# Patient Record
Sex: Female | Born: 1955 | Race: White | Hispanic: No | Smoking: Current every day smoker
Health system: Southern US, Community
[De-identification: ages and names within clinical notes are randomized; demographics above are authoritative.]

## PROBLEM LIST (undated history)

## (undated) DIAGNOSIS — J449 Chronic obstructive pulmonary disease, unspecified: Secondary | ICD-10-CM

## (undated) DIAGNOSIS — G8929 Other chronic pain: Secondary | ICD-10-CM

## (undated) DIAGNOSIS — M1990 Unspecified osteoarthritis, unspecified site: Secondary | ICD-10-CM

## (undated) DIAGNOSIS — K589 Irritable bowel syndrome without diarrhea: Secondary | ICD-10-CM

## (undated) DIAGNOSIS — C189 Malignant neoplasm of colon, unspecified: Secondary | ICD-10-CM

## (undated) DIAGNOSIS — M542 Cervicalgia: Secondary | ICD-10-CM

## (undated) DIAGNOSIS — R109 Unspecified abdominal pain: Secondary | ICD-10-CM

## (undated) DIAGNOSIS — G43909 Migraine, unspecified, not intractable, without status migrainosus: Secondary | ICD-10-CM

## (undated) DIAGNOSIS — C801 Malignant (primary) neoplasm, unspecified: Secondary | ICD-10-CM

## (undated) DIAGNOSIS — M549 Dorsalgia, unspecified: Secondary | ICD-10-CM

## (undated) DIAGNOSIS — E079 Disorder of thyroid, unspecified: Secondary | ICD-10-CM

## (undated) DIAGNOSIS — C73 Malignant neoplasm of thyroid gland: Secondary | ICD-10-CM

## (undated) HISTORY — PX: APPENDECTOMY: SHX54

## (undated) HISTORY — PX: ABDOMINAL HYSTERECTOMY: SHX81

## (undated) HISTORY — DX: Migraine, unspecified, not intractable, without status migrainosus: G43.909

## (undated) HISTORY — PX: BREAST LUMPECTOMY: SHX2

## (undated) HISTORY — DX: Malignant neoplasm of thyroid gland: C73

## (undated) HISTORY — PX: COLON RESECTION: SHX5231

---

## 2002-06-25 ENCOUNTER — Ambulatory Visit (HOSPITAL_COMMUNITY): Admission: RE | Admit: 2002-06-25 | Discharge: 2002-06-25 | Payer: Self-pay | Admitting: General Surgery

## 2007-05-30 ENCOUNTER — Ambulatory Visit (HOSPITAL_COMMUNITY): Admission: RE | Admit: 2007-05-30 | Discharge: 2007-05-30 | Payer: Self-pay | Admitting: Family Medicine

## 2008-06-20 ENCOUNTER — Ambulatory Visit (HOSPITAL_COMMUNITY): Admission: RE | Admit: 2008-06-20 | Discharge: 2008-06-20 | Payer: Self-pay | Admitting: Family Medicine

## 2009-01-31 ENCOUNTER — Emergency Department (HOSPITAL_COMMUNITY): Admission: EM | Admit: 2009-01-31 | Discharge: 2009-01-31 | Payer: Self-pay | Admitting: Emergency Medicine

## 2009-03-26 ENCOUNTER — Encounter (INDEPENDENT_AMBULATORY_CARE_PROVIDER_SITE_OTHER): Payer: Self-pay | Admitting: *Deleted

## 2009-04-23 ENCOUNTER — Encounter (INDEPENDENT_AMBULATORY_CARE_PROVIDER_SITE_OTHER): Payer: Self-pay | Admitting: *Deleted

## 2009-06-19 ENCOUNTER — Encounter (INDEPENDENT_AMBULATORY_CARE_PROVIDER_SITE_OTHER): Payer: Self-pay | Admitting: *Deleted

## 2009-06-22 ENCOUNTER — Ambulatory Visit (HOSPITAL_COMMUNITY): Admission: RE | Admit: 2009-06-22 | Discharge: 2009-06-22 | Payer: Self-pay | Admitting: Family Medicine

## 2009-07-08 ENCOUNTER — Ambulatory Visit: Payer: Self-pay | Admitting: Gastroenterology

## 2009-07-08 DIAGNOSIS — K625 Hemorrhage of anus and rectum: Secondary | ICD-10-CM

## 2009-07-08 DIAGNOSIS — R109 Unspecified abdominal pain: Secondary | ICD-10-CM | POA: Insufficient documentation

## 2009-07-12 DIAGNOSIS — Z85038 Personal history of other malignant neoplasm of large intestine: Secondary | ICD-10-CM | POA: Insufficient documentation

## 2009-07-17 ENCOUNTER — Ambulatory Visit: Payer: Self-pay | Admitting: Gastroenterology

## 2009-07-17 ENCOUNTER — Ambulatory Visit (HOSPITAL_COMMUNITY): Admission: RE | Admit: 2009-07-17 | Discharge: 2009-07-17 | Payer: Self-pay | Admitting: Gastroenterology

## 2009-07-17 HISTORY — PX: COLONOSCOPY: SHX174

## 2010-04-04 ENCOUNTER — Encounter: Payer: Self-pay | Admitting: Family Medicine

## 2010-04-15 NOTE — Assessment & Plan Note (Signed)
Summary: ABD PAIN, PERSONAL HX OF COLON CA   Visit Type:  Initial Consult Referring Provider:  Colvin Caroli FNP Primary Care Provider:  Health Dept  Chief Complaint:  abd pain.  History of Present Illness: Pain in lower abd since colon CA 2007. Took out right side. Had chemo?. Surgery at [facility]. Had one TCS ? 2008: one polyp. No problems with prep or sedation. BMs: rare, 1 q2-3 weeks. Not much water. Trying to increase fiber. No vomiting. Feels nauseous all the time especially after eating. No HONc visit since 2-3 years. Mouth stays dry and breaks out. Food may get stuck in throat. Takes ASA. Not much problems with solid food. Having pain in chest below left breast-Dx:COPD or asthma. Quit cigs  ~2 years. No Etoh. Uses Ibuprofen 1x qwk if hurting in hand, toes, knees, backs. Constipation since 2007. Can have pain in upr abdomen. Eats fiber when she can get it. MOM makes her go and now taking a whole lot. saw blood in stool one time. No weight loss. Actually gained weight.   Belly pain: all the time. Keeps awake-just about every night. Sharp:LLQ and also across bottom. No fever or chills. Having hot flashes. Having congestion. No dysuria or hematuria since DEC 2010.   Preventive Screening-Counseling & Management      Drug Use:  no.    Current Medications (verified): 1)  Advair .... As Directed 2)  Pro Air .... As Directed 3)  Ibuprofen .... As Needed 4)  Milk of Magnesia .... As Needed  Allergies (verified): No Known Drug Allergies  Past History:  Past Medical History: Colon CA Arthritis COPD?  Past Surgical History: Bowel surgery Tumor right arm 2004  Family History: No FH of Colon Cancer or polyps  Social History: Occupation: unemployed used to [employer], 2 kids-youngest-29 Illicit Drug Use - no Drug Use:  no  Review of Systems       PER HPI OTHERWISE ALL SYSTEMS NEGATIVE.  JAN 2011: 174 LBS, HEMATURIA  Vital Signs:  Patient profile:   55 year old  female Height:      62 inches Weight:      178 pounds BMI:     32.67 Temp:     98.1 degrees F oral Pulse rate:   72 / minute BP sitting:   110 / 74  (left arm) Cuff size:   regular  Vitals Entered By: Cloria Spring LPN (July 08, 2009 2:29 PM)  Physical Exam  General:  Well developed, well nourished, no acute distress. Head:  Normocephalic and atraumatic. Eyes:  PERRLA, no icterus. Mouth:  No deformity or lesions. Neck:  Supple; no masses. Lungs:  Clear throughout to auscultation. Heart:  Regular rate and rhythm; no murmurs. Abdomen:  Soft, mild TTP in BLQ, nondistended. No masses, hepatosplenomegaly or hernias noted. Normal bowel sounds without guarding and without rebound.   Extremities:  No edema noted. Neurologic:  Alert and  oriented x4;  grossly normal neurologically.  Impression & Recommendations:  Problem # 1:  ABDOMINAL PAIN, LOWER (ICD-789.09) Most likely 2o to IBS. Differential diagnosis includes microscopic colitis, lactose intolerance, and less likely IBD or diverticulitis. Take Digestive Advantage Constipation formula daily. Drink 6 to 8 cups water daily. Follow a high fiber diet. Take Miralax daily.   Orders: Consultation Level IV (46962)  Problem # 2:  RECTAL BLEEDING (ICD-569.3) Differential diagnosis includes colorectal polyp/cancer, anastomotic ulcer, and less likely IBS, or infectious colitis. Colonoscopy w/i next 1-2 weeks. Return visit in 4 mos.  Orders: Consultation Level IV (98119)  CC: PCP  Patient Instructions: 1)  Take Digestive Advantage Constipation formula daily. 2)  Drink 6 to 8 cups water daily. 3)  Colonoscopy w/i next 1-2 weeks. 4)  Follow a high fiber diet. 5)  Take Miralax daily.  6)  Return visit in 4 mos. 7)  The medication list was reviewed and reconciled.  All changed / newly prescribed medications were explained.  A complete medication list was provided to the patient / caregiver.  Appended Document: ABD PAIN, PERSONAL HX OF  COLON CA reminder in computer

## 2010-04-15 NOTE — Letter (Signed)
Summary: Generic Letter, Intro to Referring  [facility]  [address]   [place], [place] [address]   Phone: [phone]  Fax: [phone]      April 23, 2009             RE: Paula Carrillo   1955/11/30                 [address]                 [place], [place]  [address]  Dear,            Sincerely,    Ricard Dillon  [facility] Ph: [phone]   Fax: [phone]

## 2010-04-15 NOTE — Letter (Signed)
Summary: Appointment Reminder  [facility]  [address]   [place], [place] [address]   Phone: [phone]  Fax: [phone]       March 26, 2009   Paula Carrillo [address] [place], [place]  [address] 02/17/1956    Dear [name],  We have been unable to reach you by phone to schedule a follow up   appointment that was recommended for you by Dr. Darrick Penna. It is very   important that we reach you to schedule an appointment. We hope that you  allow [place] to participate in your health care needs. Please contact us at  [phone] at your earliest convenience to schedule your appointment.  Sincerely,    [facility] R. Roetta Sessions, M.D.    Kassie Mends, M.D. Lorenza Burton, FNP-BC    Tana Coast, PA-C Phone: [phone]    Fax: [phone]

## 2010-04-15 NOTE — Letter (Signed)
Summary: TCS ORDER  TCS ORDER   Imported By: Ave Filter 07/08/2009 15:33:01  _____________________________________________________________________  External Attachment:    Type:   Image     Comment:   External Document

## 2010-04-15 NOTE — Letter (Signed)
Summary: Generic Letter, Intro to Referring  [facility]  [address]   [place], [place] [address]   Phone: [phone]  Fax: [phone]      April 23, 2009             RE: Paula Carrillo   01-Sep-1955                 [address]                 [place], [place]  [address]  Dear, Appt Scheduler  You referred this pt to our office. We have tried to reach [name] by phone and mail, she has failed to contact us to schedule an appointment.    Sincerely,   [facility] Ph: [phone]   Fax: [phone]

## 2010-04-15 NOTE — Letter (Signed)
Summary: Scheduled Appointment  [facility]  [address]   [place], [place] [address]   Phone: [phone]  Fax: [phone]    June 19, 2009   Dear: Sheppard Penton            DOB: 05-27-1955    I have been instructed to schedule you an appointment in our office.  Your appointment is as follows:   Date: ___4/27/11________________   Time: ___4:00PM______________     Please be here 15 minutes early.   Provider: _______Fields____________________________________    Please contact the office if you need to reschedule this appointment for a more convenient time.   Thank you,    [facility] Ph: [phone]   Fax: [phone]

## 2010-06-16 LAB — URINALYSIS, ROUTINE W REFLEX MICROSCOPIC
Bilirubin Urine: NEGATIVE
Nitrite: NEGATIVE
Urobilinogen, UA: 0.2 mg/dL (ref 0.0–1.0)

## 2010-06-16 LAB — DIFFERENTIAL
Basophils Absolute: 0.1 10*3/uL (ref 0.0–0.1)
Lymphocytes Relative: 38 % (ref 12–46)
Lymphs Abs: 2.5 10*3/uL (ref 0.7–4.0)
Monocytes Absolute: 0.5 10*3/uL (ref 0.1–1.0)
Monocytes Relative: 8 % (ref 3–12)

## 2010-06-16 LAB — BASIC METABOLIC PANEL
BUN: 6 mg/dL (ref 6–23)
Creatinine, Ser: 0.62 mg/dL (ref 0.4–1.2)
Potassium: 3.4 mEq/L — ABNORMAL LOW (ref 3.5–5.1)

## 2010-06-16 LAB — CBC
Hemoglobin: 12.9 g/dL (ref 12.0–15.0)
RBC: 4.23 MIL/uL (ref 3.87–5.11)
RDW: 13.2 % (ref 11.5–15.5)
WBC: 6.6 10*3/uL (ref 4.0–10.5)

## 2010-06-16 LAB — URINE MICROSCOPIC-ADD ON

## 2010-06-16 LAB — PROTIME-INR
INR: 0.88 (ref 0.00–1.49)
Prothrombin Time: 12.1 seconds (ref 11.6–15.2)

## 2010-06-16 LAB — APTT: aPTT: 35 seconds (ref 24–37)

## 2010-07-30 NOTE — H&P (Signed)
   NAME:  Paula Carrillo, Weissmann NO.:  [id number]   MEDICAL RECORD NO.:  [id number]                  PATIENT TYPE:   LOCATION:                                       FACILITY:   PHYSICIAN:  Dalia Heading, M.D.               DATE OF BIRTH:  1955/09/01   DATE OF ADMISSION:  06/25/2002  DATE OF DISCHARGE:                                HISTORY & PHYSICAL   CHIEF COMPLAINT:  Enlarging mass, right arm.   HISTORY AND PHYSICAL:  The patient is a 55 year old, white female who is  referred for evaluation and treatment of an enlarging mass on her right arm.  It has been present for several years, but recently has increased in size  and is causing some discomfort.   PAST MEDICAL HISTORY:  Unremarkable.   PAST SURGICAL HISTORY:  Unremarkable.   CURRENT MEDICATIONS:  None.   ALLERGIES:  No known drug allergies.   REVIEW OF SYMPTOMS:  The patient does smoke one pack of cigarettes per day.   PHYSICAL EXAMINATION:  GENERAL:  The patient is a well-developed, well-  nourished, white female in no acute distress.  VITAL SIGNS:  She is afebrile and vital signs are stable.  LUNGS:  Clear to auscultation with equal breath sounds bilaterally.  HEART:  Regular rate and rhythm without S3, S4 or murmurs.  EXTREMITIES:  Right arm examination reveals a large, ovoid, greater than 6  cm, soft, subcutaneous mass in the right upper arm posteriorly.  It is  mobile.  The right arm is neurovascularly intact.   IMPRESSION:  Mass, right arm.   PLAN:  The patient is scheduled for excision of the mass, right arm, on  June 25, 2002.  The risks and benefits of the procedure including bleeding  and infection were fully explained to the patient gaining informed consent.                                               Dalia Heading, M.D.    MAJ/MEDQ  D:  06/13/2002  T:  06/13/2002  Job:  [id number]   cc:   Health Department

## 2010-07-30 NOTE — Op Note (Signed)
   NAME:  Paula Carrillo, Paula Carrillo                       ACCOUNT NO.:  [id number]   MEDICAL RECORD NO.:  [id number]                   PATIENT TYPE:  AMB   LOCATION:  DAY                                  FACILITY:  [facility]   PHYSICIAN:  Dalia Heading, M.D.               DATE OF BIRTH:  10-23-1955   DATE OF PROCEDURE:  06/25/2002  DATE OF DISCHARGE:                                 OPERATIVE REPORT   PREOPERATIVE DIAGNOSIS:  Enlarging mass, right arm.   POSTOPERATIVE DIAGNOSIS:  Enlarging mass, right arm.   PROCEDURE:  Excision of benign mass, right arm, 9 cm.   SURGEON:  Dalia Heading, M.D.   ANESTHESIA:  General.   INDICATIONS:  The patient is a 55 year old white female who presents with an  enlarging mass in the posterior aspect of the right upper arm.  The patient  now comes to the operating room for excision of the mass.  The risks and  benefits of the procedure, including bleeding, infection, and recurrence of  the mass, were fully explained to the patient, who gave informed consent.   DESCRIPTION OF PROCEDURE:  The patient was placed in the supine position.  After general anesthesia was administered, the right arm was prepped and  draped using the usual sterile technique with Betadine.  Surgical site  confirmation was performed.   A longitudinal incision was made over the mass.  This was taken down in the  subcutaneous tissue, and the mass was excised without difficulty.  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  It was sent to pathology for further examination.  Any bleeding was controlled using Bovie electrocautery.  The subcutaneous  layer was reapproximated using a 3-0 Vicryl interrupted suture.  The skin  was closed using a 4-0 nylon vertical mattress suture.  Betadine ointment  and dry sterile dressing were applied.  Sensorcaine 0.5% was instilled into  the surrounding wound.   All tape and needle counts were correct at the end of the procedure.  The  patient was awakened in the  operating room and went back to the recovery  room awake, in stable condition.   COMPLICATIONS:  None.    SPECIMENS:  Mass, right arm.   ESTIMATED BLOOD LOSS:  Minimal.                                               Dalia Heading, M.D.    MAJ/MEDQ  D:  06/25/2002  T:  06/25/2002  Job:  [id number]   cc:   Health Department

## 2011-01-04 ENCOUNTER — Other Ambulatory Visit (HOSPITAL_COMMUNITY): Payer: Self-pay | Admitting: Family Medicine

## 2011-01-04 DIAGNOSIS — Z139 Encounter for screening, unspecified: Secondary | ICD-10-CM

## 2011-01-13 ENCOUNTER — Ambulatory Visit (HOSPITAL_COMMUNITY): Payer: Self-pay

## 2011-02-18 ENCOUNTER — Ambulatory Visit (HOSPITAL_COMMUNITY)
Admission: RE | Admit: 2011-02-18 | Discharge: 2011-02-18 | Disposition: A | Discharge status: Home / Self Care | Payer: PRIVATE HEALTH INSURANCE | Source: Ambulatory Visit | Attending: Family Medicine | Admitting: Family Medicine

## 2011-02-18 DIAGNOSIS — Z139 Encounter for screening, unspecified: Secondary | ICD-10-CM

## 2011-02-18 DIAGNOSIS — Z1231 Encounter for screening mammogram for malignant neoplasm of breast: Secondary | ICD-10-CM | POA: Insufficient documentation

## 2011-05-24 ENCOUNTER — Emergency Department (HOSPITAL_COMMUNITY): Payer: Self-pay

## 2011-05-24 ENCOUNTER — Emergency Department (HOSPITAL_COMMUNITY)
Admission: EM | Admit: 2011-05-24 | Discharge: 2011-05-24 | Disposition: A | Discharge status: Home Health | Payer: Self-pay | Attending: Emergency Medicine | Admitting: Emergency Medicine

## 2011-05-24 ENCOUNTER — Other Ambulatory Visit: Payer: Self-pay

## 2011-05-24 ENCOUNTER — Encounter (HOSPITAL_COMMUNITY): Payer: Self-pay | Admitting: Emergency Medicine

## 2011-05-24 DIAGNOSIS — J45909 Unspecified asthma, uncomplicated: Secondary | ICD-10-CM | POA: Diagnosis present

## 2011-05-24 DIAGNOSIS — K589 Irritable bowel syndrome without diarrhea: Secondary | ICD-10-CM | POA: Insufficient documentation

## 2011-05-24 DIAGNOSIS — R51 Headache: Secondary | ICD-10-CM | POA: Insufficient documentation

## 2011-05-24 DIAGNOSIS — M545 Low back pain, unspecified: Secondary | ICD-10-CM | POA: Insufficient documentation

## 2011-05-24 DIAGNOSIS — J449 Chronic obstructive pulmonary disease, unspecified: Secondary | ICD-10-CM | POA: Insufficient documentation

## 2011-05-24 DIAGNOSIS — R42 Dizziness and giddiness: Secondary | ICD-10-CM | POA: Insufficient documentation

## 2011-05-24 DIAGNOSIS — R001 Bradycardia, unspecified: Secondary | ICD-10-CM | POA: Diagnosis present

## 2011-05-24 DIAGNOSIS — R9431 Abnormal electrocardiogram [ECG] [EKG]: Secondary | ICD-10-CM | POA: Insufficient documentation

## 2011-05-24 DIAGNOSIS — R079 Chest pain, unspecified: Secondary | ICD-10-CM | POA: Insufficient documentation

## 2011-05-24 DIAGNOSIS — M542 Cervicalgia: Secondary | ICD-10-CM | POA: Insufficient documentation

## 2011-05-24 DIAGNOSIS — J4489 Other specified chronic obstructive pulmonary disease: Secondary | ICD-10-CM | POA: Insufficient documentation

## 2011-05-24 DIAGNOSIS — M7918 Myalgia, other site: Secondary | ICD-10-CM | POA: Diagnosis present

## 2011-05-24 DIAGNOSIS — R109 Unspecified abdominal pain: Secondary | ICD-10-CM | POA: Insufficient documentation

## 2011-05-24 DIAGNOSIS — G8929 Other chronic pain: Secondary | ICD-10-CM | POA: Insufficient documentation

## 2011-05-24 HISTORY — DX: Malignant (primary) neoplasm, unspecified: C80.1

## 2011-05-24 HISTORY — DX: Chronic obstructive pulmonary disease, unspecified: J44.9

## 2011-05-24 HISTORY — DX: Unspecified abdominal pain: R10.9

## 2011-05-24 HISTORY — DX: Cervicalgia: M54.2

## 2011-05-24 HISTORY — DX: Other chronic pain: G89.29

## 2011-05-24 HISTORY — DX: Malignant neoplasm of colon, unspecified: C18.9

## 2011-05-24 HISTORY — DX: Irritable bowel syndrome, unspecified: K58.9

## 2011-05-24 HISTORY — DX: Dorsalgia, unspecified: M54.9

## 2011-05-24 LAB — TROPONIN I
Troponin I: <0.3 ng/mL (ref <0.30)
Troponin I: <0.3 ng/mL (ref <0.30)

## 2011-05-24 LAB — CBC
HCT: 39.2 % (ref 36.0–46.0)
Hemoglobin: 13.2 g/dL (ref 12.0–15.0)
MCHC: 33.7 g/dL (ref 30.0–36.0)
MCV: 87.1 fL (ref 78.0–100.0)
Platelets: 304 10*3/uL (ref 150–400)
RDW: 12.9 % (ref 11.5–15.5)

## 2011-05-24 LAB — DIFFERENTIAL
Eosinophils Relative: 3 % (ref 0–5)
Lymphocytes Relative: 40 % (ref 12–46)
Lymphs Abs: 2.4 10*3/uL (ref 0.7–4.0)

## 2011-05-24 LAB — BASIC METABOLIC PANEL
BUN: 7 mg/dL (ref 6–23)
GFR calc non Af Amer: >90 mL/min (ref >90)
Potassium: 3.6 mEq/L (ref 3.5–5.1)
Sodium: 141 mEq/L (ref 135–145)

## 2011-05-24 MED ORDER — NITROGLYCERIN 0.4 MG SL SUBL
0.4000 mg | SUBLINGUAL_TABLET | SUBLINGUAL | Status: DC | PRN
  Administered 2011-05-24: 0.4 mg via SUBLINGUAL
  Filled 2011-05-24: qty 25

## 2011-05-24 MED ORDER — GI COCKTAIL ~~LOC~~
30.0000 mL | Freq: Once | ORAL | Status: AC
  Administered 2011-05-24: 30 mL via ORAL
  Filled 2011-05-24: qty 30

## 2011-05-24 MED ORDER — MORPHINE SULFATE 2 MG/ML IJ SOLN
2.0000 mg | INTRAMUSCULAR | Status: DC | PRN
  Administered 2011-05-24: 2 mg via INTRAVENOUS
  Filled 2011-05-24: qty 1

## 2011-05-24 MED ORDER — ASPIRIN 81 MG PO CHEW
324.0000 mg | CHEWABLE_TABLET | Freq: Once | ORAL | Status: AC
  Administered 2011-05-24: 324 mg via ORAL
  Filled 2011-05-24: qty 4

## 2011-05-24 NOTE — Discharge Instructions (Signed)
Stop taking the ibuprofen and take vicodin for pain. Take prilosec for acid reflux and return to the health department in 2-3 days.

## 2011-05-24 NOTE — Consult Note (Signed)
Paula Carrillo MRN: [id number] DOB/AGE: May 27, 1955 56 y.o. Primary Care Physician:No primary provider on file. Referring physician: Dr. Clarene Duke Consult date: 05/24/2011 Chief Complaint: Chest pain HPI: The patient is a 56 year old woman with a past medical history significant for colon cancer, asthma, degenerative joint disease, and chronic pain, who presented to the emergency department with a chief complaint of left-sided chest pain. She also complained of neck pain radiating to her left arm, occasional wheezing, occasional nausea, occasional pleurisy, occasional shortness of breath, abdominal pain, diarrhea, swelling in her ankles, occasional fever, and occasional chills. She had no complaints of diaphoresis, vomiting, abdominal pain, or heartburn.  In the emergency department, she is noted to be mildly bradycardic otherwise hemodynamically stable. All of her laboratory data are within normal limits. Her EKG reveals sinus bradycardia with nonspecific T-wave abnormalities, but no ST elevations or depressions. Her chest x-ray reveals no active cardiopulmonary disease.  Past Medical History  Diagnosis Date  . Chronic pain   . Asthma   . Cancer   . Colon cancer   . COPD (chronic obstructive pulmonary disease)   . IBS (irritable bowel syndrome)   . Chronic back pain   . Chronic neck pain   . Chronic abdominal pain   . Hemorrhoids     Past Surgical History  Procedure Date  . Appendectomy   . Colon resection   . Abdominal hysterectomy   . Breast lumpectomy     Prior to Admission medications   Medication Sig Start Date End Date Taking? Authorizing Provider  albuterol (PROVENTIL HFA;VENTOLIN HFA) 108 (90 BASE) MCG/ACT inhaler Inhale 2 puffs into the lungs every 6 (six) hours as needed. Shortness of breath   Yes Historical Provider, MD  benzonatate (TESSALON) 100 MG capsule Take 100 mg by mouth 3 (three) times daily as needed. cough   Yes Historical Provider, MD  cyclobenzaprine  (FLEXERIL) 10 MG tablet Take 10 mg by mouth 3 (three) times daily as needed. Muscle spasms   Yes Historical Provider, MD  Fluticasone-Salmeterol (ADVAIR) 500-50 MCG/DOSE AEPB Inhale 1 puff into the lungs every 12 (twelve) hours.   Yes Historical Provider, MD  ibuprofen (ADVIL,MOTRIN) 800 MG tablet Take 800 mg by mouth every 8 (eight) hours as needed. pain   Yes Historical Provider, MD  loratadine (CLARITIN) 10 MG tablet Take 10 mg by mouth daily. allergies   Yes Historical Provider, MD  predniSONE (DELTASONE) 5 MG tablet Take 5 mg by mouth daily.   Yes Historical Provider, MD    Allergies: No Known Allergies  No family history on file.  Her mother died of a stroke. Her father died of emphysema. Neither of her parents had heart disease or heart failure.  Social History: She is separated. She lives with her daughter in [place]. She denies alcohol and illicit drug use.  reports that she has quit smoking. She does not have any smokeless tobacco history on file. She reports that she does not drink alcohol or use illicit drugs.    ROS: Grossly positive.  PHYSICAL EXAM: Blood pressure 122/80, pulse 95, temperature 98 F (36.7 C), temperature source Oral, resp. rate 18, height 5\' 4"  (1.626 m), weight 77.111 kg (170 lb), SpO2 96.00%.  General: Alert 56 year old Caucasian woman sitting up in bed, in no acute distress. HEENT: Head is normocephalic, nontraumatic. Pupils are equal, round, and reactive to light. Extraocular lungs are intact. Conjunctivae are clear. Sclerae are white. Neck: Supple, no optic, no thyromegaly, no JVD. She does have mild to moderate tenderness  over her para cervical muscles and trapezius muscles. No overt spasms, erythema, or edema. Lungs: Clear to auscultation bilaterally. Heart: S1, S2, with borderline bradycardia. Chest wall: Tender over the pectoralis major and pectoralis minor on the left. Abdomen: Positive bowel sounds, soft, nontender, nondistended. Extremities: Pedal  pulses palpable bilaterally. No pretibial edema and no pedal edema. No acute hot red joints. She does have some tenderness over her knees without effusion warmth, or redness. Neurologic: She is alert and oriented x3.  Basic Metabolic Panel:  Basename 05/24/11 1403  NA 141  K 3.6  CL 107  CO2 25  GLUCOSE 96  BUN 7  CREATININE 0.59  CALCIUM 10.0  MG --  PHOS --   Liver Function Tests: No results found for this basename: AST:2,ALT:2,ALKPHOS:2,BILITOT:2,PROT:2,ALBUMIN:2 in the last 72 hours No results found for this basename: LIPASE:2,AMYLASE:2 in the last 72 hours No results found for this basename: AMMONIA:2 in the last 72 hours CBC:  Basename 05/24/11 1403  WBC 5.9  NEUTROABS 2.9  HGB 13.2  HCT 39.2  MCV 87.1  PLT 304   Cardiac Enzymes:  Basename 05/24/11 1632 05/24/11 1403  CKTOTAL -- --  CKMB -- --  CKMBINDEX -- --  TROPONINI <0.30 <0.30   BNP: No results found for this basename: PROBNP:3 in the last 72 hours D-Dimer:  Basename 05/24/11 1403  DDIMER 0.27   CBG: No results found for this basename: GLUCAP:6 in the last 72 hours Hemoglobin A1C: No results found for this basename: HGBA1C in the last 72 hours Fasting Lipid Panel: No results found for this basename: CHOL,HDL,LDLCALC,TRIG,CHOLHDL,LDLDIRECT in the last 72 hours Thyroid Function Tests: No results found for this basename: TSH,T4TOTAL,FREET4,T3FREE,THYROIDAB in the last 72 hours Anemia Panel: No results found for this basename: VITAMINB12,FOLATE,FERRITIN,TIBC,IRON,RETICCTPCT in the last 72 hours Coagulation: No results found for this basename: LABPROT:2,INR:2 in the last 72 hours Urine Drug Screen: Drugs of Abuse  No results found for this basename: labopia, cocainscrnur, labbenz, amphetmu, thcu, labbarb    Alcohol Level: No results found for this basename: ETH:2 in the last 72 hours Urinalysis: No results found for this basename:  COLORURINE:2,APPERANCEUR:2,LABSPEC:2,PHURINE:2,GLUCOSEU:2,HGBUR:2,BILIRUBINUR:2,KETONESUR:2,PROTEINUR:2,UROBILINOGEN:2,NITRITE:2,LEUKOCYTESUR:2 in the last 72 hours Misc. Labs:   EKG: Sinus bradycardia with a heart rate of 54 beats per minute. EKG: Sinus bradycardia with a heart rate of 57 beats per minute and nonspecific T-wave abnormalities.  No results found for this or any previous visit (from the past 240 hour(s)).   Results for orders placed during the hospital encounter of 05/24/11 (from the past 48 hour(s))  BASIC METABOLIC PANEL     Status: Normal   Collection Time   05/24/11  2:03 PM      Component Value Range Comment   Sodium 141  135 - 145 (mEq/L)    Potassium 3.6  3.5 - 5.1 (mEq/L)    Chloride 107  96 - 112 (mEq/L)    CO2 25  19 - 32 (mEq/L)    Glucose, Bld 96  70 - 99 (mg/dL)    BUN 7  6 - 23 (mg/dL)    Creatinine, Ser 4.09  0.50 - 1.10 (mg/dL)    Calcium 81.1  8.4 - 10.5 (mg/dL)    GFR calc non Af Amer >90  >90 (mL/min)    GFR calc Af Amer >90  >90 (mL/min)   TROPONIN I     Status: Normal   Collection Time   05/24/11  2:03 PM      Component Value Range Comment   Troponin I <  0.30  <0.30 (ng/mL)   CBC     Status: Normal   Collection Time   05/24/11  2:03 PM      Component Value Range Comment   WBC 5.9  4.0 - 10.5 (K/uL)    RBC 4.50  3.87 - 5.11 (MIL/uL)    Hemoglobin 13.2  12.0 - 15.0 (g/dL)    HCT 16.1  09.6 - 04.5 (%)    MCV 87.1  78.0 - 100.0 (fL)    MCH 29.3  26.0 - 34.0 (pg)    MCHC 33.7  30.0 - 36.0 (g/dL)    RDW 40.9  81.1 - 91.4 (%)    Platelets 304  150 - 400 (K/uL)   DIFFERENTIAL     Status: Normal   Collection Time   05/24/11  2:03 PM      Component Value Range Comment   Neutrophils Relative 49  43 - 77 (%)    Neutro Abs 2.9  1.7 - 7.7 (K/uL)    Lymphocytes Relative 40  12 - 46 (%)    Lymphs Abs 2.4  0.7 - 4.0 (K/uL)    Monocytes Relative 6  3 - 12 (%)    Monocytes Absolute 0.4  0.1 - 1.0 (K/uL)    Eosinophils Relative 3  0 - 5 (%)     Eosinophils Absolute 0.2  0.0 - 0.7 (K/uL)    Basophils Relative 1  0 - 1 (%)    Basophils Absolute 0.1  0.0 - 0.1 (K/uL)   D-DIMER, QUANTITATIVE     Status: Normal   Collection Time   05/24/11  2:03 PM      Component Value Range Comment   D-Dimer, Quant 0.27  0.00 - 0.48 (ug/mL-FEU)   TROPONIN I     Status: Normal   Collection Time   05/24/11  4:32 PM      Component Value Range Comment   Troponin I <0.30  <0.30 (ng/mL)     Dg Chest 2 View  05/24/2011  *RADIOLOGY REPORT*  Clinical Data: Rule out infiltrate, congestive heart failure  CHEST - 2 VIEW  Comparison: None.  Findings: Cardiomediastinal silhouette is unremarkable.  No acute infiltrate or pleural effusion.  No pulmonary edema.  Bony thorax is unremarkable.  IMPRESSION: No active disease.  Original Report Authenticated By: Natasha Mead, M.D.    Impression:  Active Problems:  Chest pain  Musculoskeletal pain  Bradycardia  Asthma  This is a 56 year old woman with a history of asthma and degenerative joint disease. She presents with chest pain which is more consistent with musculoskeletal pain. She may have radiating pain from degenerative joint pain in her neck and her shoulder girdle. She has degenerative joint disease for which she takes high-dose ibuprofen. Therefore, reflux and/or esophagitis is a consideration. She does not need to be admitted to the hospital given her virtually unremarkable EKG, normal cardiac enzymes, no significant risk factors, and hemodynamic stability.   Plan:  The patient was advised to discontinue ibuprofen. She was given a prescription for Vicodin 5/325 mg one tablet every 4 hours as needed for pain. She was advised to take Prilosec OTC. A prescription was given. She was advised to followup with her primary care provider at the health department for further evaluation. She was discharged to home per my recommendation.      Tempie Gibeault 05/24/2011, 7:57 PM

## 2011-05-24 NOTE — ED Notes (Signed)
Pt c/o abd, back, chest, neck, and head pain that has been going on a while. Pt was sent over by the [facility].

## 2011-05-24 NOTE — ED Provider Notes (Signed)
History     CSN: [id number]  Arrival date & time 05/24/11  1247   First MD Initiated Contact with Patient 05/24/11 1326      Chief Complaint  Patient presents with  . Abdominal Pain  . Back Pain  . Headache  . Dizziness  . Chest Pain    The history is provided by the patient. History Limited By: poor historian.   Pt was seen at 1335.  Per pt, c/o gradual onset and persistence of intermittent left sided chest "pain" x3 weeks, worse over the past 3 days.  Pt describes the pain as on her left chest area.  Pt also c/o gradual onset and persistence of constant acute flair of her chronic neck and low back "pains" for the past several months.  Denies any change in her usual chronic pain pattern.  Denies incont/retention of bowel or bladder, no saddle anesthesia, no focal motor weakness, no tingling/numbness in extremities, no fevers, no injury.   [facility] and sent to the ED for further eval.    Past Medical History  Diagnosis Date  . Chronic pain   . Asthma   . Cancer   . Colon cancer   . COPD (chronic obstructive pulmonary disease)   . IBS (irritable bowel syndrome)   . Chronic back pain   . Chronic neck pain   . Chronic abdominal pain   . Hemorrhoids     Past Surgical History  Procedure Date  . Appendectomy   . Colon resection   . Abdominal hysterectomy   . Breast lumpectomy     History  Substance Use Topics  . Smoking status: Former Games developer  . Smokeless tobacco: Not on file  . Alcohol Use: No    Review of Systems  Unable to perform ROS: Other    Allergies  Review of patient's allergies indicates no known allergies.  Home Medications   Current Outpatient Rx  Name Route Sig Dispense Refill  . ALBUTEROL SULFATE HFA 108 (90 BASE) MCG/ACT IN AERS Inhalation Inhale 2 puffs into the lungs every 6 (six) hours as needed. Shortness of breath    . BENZONATATE 100 MG PO CAPS Oral Take 100 mg by mouth 3 (three) times daily as needed. cough    .  CYCLOBENZAPRINE HCL 10 MG PO TABS Oral Take 10 mg by mouth 3 (three) times daily as needed. Muscle spasms    . FLUTICASONE-SALMETEROL 500-50 MCG/DOSE IN AEPB Inhalation Inhale 1 puff into the lungs every 12 (twelve) hours.    . IBUPROFEN 800 MG PO TABS Oral Take 800 mg by mouth every 8 (eight) hours as needed. pain    . LORATADINE 10 MG PO TABS Oral Take 10 mg by mouth daily. allergies    . PREDNISONE 5 MG PO TABS Oral Take 5 mg by mouth daily.      BP 122/80  Pulse 95  Temp(Src) 98 F (36.7 C) (Oral)  Resp 18  Ht 5\' 4"  (1.626 m)  Wt 170 lb (77.111 kg)  BMI 29.18 kg/m2  SpO2 96%  Physical Exam 1340: Physical examination:  Nursing notes reviewed; Vital signs and O2 SAT reviewed;  Constitutional: Well developed, Well nourished, Well hydrated, In no acute distress; Head:  Normocephalic, atraumatic; Eyes: EOMI, PERRL, No scleral icterus; ENMT: Mouth and pharynx normal, Mucous membranes moist; Neck: Supple, Full range of motion, No lymphadenopathy; Cardiovascular: Regular rate and rhythm, No murmur, rub, or gallop; Respiratory: Breath sounds clear & equal bilaterally,  No rales, rhonchi, wheezes, or rub, Normal respiratory effort/excursion; Chest: Nontender, Movement normal; Abdomen: Soft, +mid-epigastric and LUQ tenderness to palp, Nondistended, Normal bowel sounds; Extremities: Pulses normal, No tenderness, No edema, No calf edema or asymmetry.; Neuro: AA&Ox3, Major CN grossly intact.  No gross focal motor or sensory deficits in extremities.; Skin: Color normal, Warm, Dry, no rash.    ED Course  Procedures   1345:  06/2009 ofc note from GI Dr. Darrick Penna notes that pt has hx of chronic abd pain, as well as left CP "below her breast."  Pt asked regarding this.  Pt does agree that the discomfort under her left breast is chronic and unchanged over the past several years.  Will proceed with workup today.   1600:  Very long d/w pt regarding her symptoms as starting to get results of workup: Pt now  states that her usual chronic left sided chest pain underneath her breast "lasts for a few seconds" before resolving is unchanged for the past several years.  [facility] because for the past few days to weeks, she has had a NEW area of left sided CP, located at her left parasternal and anterior chest wall, that has been intermittently coming and going, lasts approx each episode, and is assoc with feeling "generally weak," "lightheaded," "nauseated," and "SOB."  States she has to "lay down" because her symptoms "make me feel so bad."  EKG repeated and now has new flipped T-waves anterior leads V3 and V4 compared to previous completed today, will check another troponin.  T/C to [facility] Dr. Sherrie Mustache, case discussed, including:  HPI, pertinent PM/SHx, VS/PE, dx testing, ED course and treatment:  Agreeable to come to ED for eval to admit for observation.    MDM  MDM Reviewed: nursing note, vitals and previous chart Reviewed previous: ECG Interpretation: ECG, labs and x-ray    Date: 05/24/2011  Rate: 54  Rhythm: sinus bradycardia  QRS Axis: normal  Intervals: normal  ST/T Wave abnormalities: normal  Conduction Disutrbances:none  Narrative Interpretation:   Old EKG Reviewed: none available.   Date: 05/24/2011  Rate: 57  Rhythm: sinus bradycardia  QRS Axis: normal  Intervals: normal  ST/T Wave abnormalities: nonspecific T wave changes  Conduction Disutrbances:none  Narrative Interpretation:   Old EKG Reviewed: changes noted new flipped T-waves V3 and V4 compared to previous EKG today.   Results for orders placed during the hospital encounter of 05/24/11  BASIC METABOLIC PANEL      Component Value Range   Sodium 141  135 - 145 (mEq/L)   Potassium 3.6  3.5 - 5.1 (mEq/L)   Chloride 107  96 - 112 (mEq/L)   CO2 25  19 - 32 (mEq/L)   Glucose, Bld 96  70 - 99 (mg/dL)   BUN 7  6 - 23 (mg/dL)   Creatinine, Ser 4.09  0.50 - 1.10 (mg/dL)   Calcium 81.1  8.4 -  10.5 (mg/dL)   GFR calc non Af Amer >90  >90 (mL/min)   GFR calc Af Amer >90  >90 (mL/min)  TROPONIN I      Component Value Range   Troponin I <0.30  <0.30 (ng/mL)  CBC      Component Value Range   WBC 5.9  4.0 - 10.5 (K/uL)   RBC 4.50  3.87 - 5.11 (MIL/uL)   Hemoglobin 13.2  12.0 - 15.0 (g/dL)   HCT 91.4  78.2 - 95.6 (%)   MCV 87.1  78.0 - 100.0 (fL)   MCH 29.3  26.0 - 34.0 (pg)   MCHC 33.7  30.0 - 36.0 (g/dL)   RDW 16.1  09.6 - 04.5 (%)   Platelets 304  150 - 400 (K/uL)  DIFFERENTIAL      Component Value Range   Neutrophils Relative 49  43 - 77 (%)   Neutro Abs 2.9  1.7 - 7.7 (K/uL)   Lymphocytes Relative 40  12 - 46 (%)   Lymphs Abs 2.4  0.7 - 4.0 (K/uL)   Monocytes Relative 6  3 - 12 (%)   Monocytes Absolute 0.4  0.1 - 1.0 (K/uL)   Eosinophils Relative 3  0 - 5 (%)   Eosinophils Absolute 0.2  0.0 - 0.7 (K/uL)   Basophils Relative 1  0 - 1 (%)   Basophils Absolute 0.1  0.0 - 0.1 (K/uL)  D-DIMER, QUANTITATIVE      Component Value Range   D-Dimer, Quant 0.27  0.00 - 0.48 (ug/mL-FEU)    Dg Chest 2 View 05/24/2011  *RADIOLOGY REPORT*  Clinical Data: Rule out infiltrate, congestive heart failure  CHEST - 2 VIEW  Comparison: None.  Findings: Cardiomediastinal silhouette is unremarkable.  No acute infiltrate or pleural effusion.  No pulmonary edema.  Bony thorax is unremarkable.  IMPRESSION: No active disease.  Original Report Authenticated By: Natasha Mead, M.D.               Laray Anger, DO 05/26/11 1433

## 2012-03-27 ENCOUNTER — Ambulatory Visit (INDEPENDENT_AMBULATORY_CARE_PROVIDER_SITE_OTHER): Payer: Medicaid Other | Admitting: Urology

## 2012-03-27 DIAGNOSIS — N393 Stress incontinence (female) (male): Secondary | ICD-10-CM

## 2012-03-27 DIAGNOSIS — N3000 Acute cystitis without hematuria: Secondary | ICD-10-CM

## 2012-03-27 DIAGNOSIS — Q619 Cystic kidney disease, unspecified: Secondary | ICD-10-CM

## 2012-07-09 ENCOUNTER — Encounter: Payer: Self-pay | Admitting: Gastroenterology

## 2012-07-11 ENCOUNTER — Encounter: Payer: Self-pay | Admitting: Gastroenterology

## 2012-07-11 ENCOUNTER — Encounter (HOSPITAL_COMMUNITY): Payer: Self-pay | Admitting: Pharmacy Technician

## 2012-07-11 ENCOUNTER — Other Ambulatory Visit: Payer: Self-pay

## 2012-07-11 ENCOUNTER — Ambulatory Visit (INDEPENDENT_AMBULATORY_CARE_PROVIDER_SITE_OTHER): Payer: Medicaid Other | Admitting: Gastroenterology

## 2012-07-11 VITALS — BP 135/72 | HR 78 | Temp 98.4°F | Ht 64.0 in | Wt 179.8 lb

## 2012-07-11 DIAGNOSIS — Z86010 Personal history of colonic polyps: Secondary | ICD-10-CM

## 2012-07-11 DIAGNOSIS — K625 Hemorrhage of anus and rectum: Secondary | ICD-10-CM

## 2012-07-11 DIAGNOSIS — Z85038 Personal history of other malignant neoplasm of large intestine: Secondary | ICD-10-CM

## 2012-07-11 MED ORDER — PEG 3350-KCL-NA BICARB-NACL 420 G PO SOLR: 4000.0000 mL | ORAL | Status: DC

## 2012-07-11 MED ORDER — HYDROCORTISONE ACETATE 25 MG RE SUPP: 25.0000 mg | Freq: Two times a day (BID) | RECTAL | Status: DC

## 2012-07-11 NOTE — Progress Notes (Signed)
.   Primary Care Physician:  Ernestine Conrad, MD Primary Gastroenterologist:  Dr. Darrick Penna   Chief Complaint  Patient presents with  . Rectal Bleeding  . Abdominal Pain    HPI:   [name] is a 57 year old female presenting today with a personal history of colon cancer, s/p resection, with last surveillance colonoscopy by Dr. Darrick Penna in 2011. Findings of normal anastomosis, internal hemorrhoids, tubular adenoma, and rare sigmoid diverticula.   Will take Metamucil, sometimes MOM. Dealing with constipation lately. Sometimes hard. +7 lbs weight loss in past few weeks, has cut down on po intake intentionally. "cut back". Sometimes a little nausea, sharp LLQ pain occasionally. Chronic since colectomy.   Notes rectal bleeding, rectal burning/sore onset about a year ago. Notes several episodes in past month. Painful when wiping, notes smears in her underwear, which is chronic. Paper hematochezia.   Past Medical History  Diagnosis Date  . Chronic pain   . Asthma   . Cancer   . Colon cancer   . COPD (chronic obstructive pulmonary disease)   . IBS (irritable bowel syndrome)   . Chronic back pain   . Chronic neck pain   . Chronic abdominal pain   . Hemorrhoids     Past Surgical History  Procedure Laterality Date  . Appendectomy    . Colon resection    . Abdominal hysterectomy    . Breast lumpectomy    . Colonoscopy  07/17/09    WUJ:WJXBJ internal hemorrhoids/tortuous colon/3-mm sessile hepatic polyp/sigmoid colon diverticula, small internal hemorrhoids, path: tubular adenoma    Current Outpatient Prescriptions  Medication Sig Dispense Refill  . albuterol (PROVENTIL HFA;VENTOLIN HFA) 108 (90 BASE) MCG/ACT inhaler Inhale 2 puffs into the lungs every 6 (six) hours as needed. Shortness of breath      . benzonatate (TESSALON) 100 MG capsule Take 100 mg by mouth 3 (three) times daily as needed. cough      . cyclobenzaprine (FLEXERIL) 10 MG tablet Take 10 mg by mouth 3 (three) times daily as  needed. Muscle spasms      . fluticasone (VERAMYST) 27.5 MCG/SPRAY nasal spray Place 2 sprays into the nose daily.      . Fluticasone-Salmeterol (ADVAIR) 250-50 MCG/DOSE AEPB Inhale 1 puff into the lungs every 12 (twelve) hours.      . levETIRAcetam (KEPPRA) 500 MG tablet Take 500 mg by mouth daily.      Marland Kitchen loratadine (CLARITIN) 10 MG tablet Take 10 mg by mouth daily. allergies      . tiotropium (SPIRIVA) 18 MCG inhalation capsule Place 18 mcg into inhaler and inhale daily.      . hydrocortisone (ANUSOL-HC) 25 MG suppository Place 1 suppository (25 mg total) rectally every 12 (twelve) hours.  12 suppository  1   No current facility-administered medications for this visit.    Allergies as of 07/11/2012 - Review Complete 07/11/2012  Allergen Reaction Noted  . Sulfa antibiotics  07/11/2012    Family History  Problem Relation Age of Onset  . Colon cancer Neg Hx     History   Social History  . Marital Status: Legally Separated    Spouse Name: N/A    Number of Children: N/A  . Years of Education: N/A   Occupational History  . Not on file.   Social History Main Topics  . Smoking status: Former Games developer  . Smokeless tobacco: Not on file  . Alcohol Use: No  . Drug Use: No  . Sexually Active: Not on file  Other Topics Concern  . Not on file   Social History Narrative  . No narrative on file    Review of Systems: Gen: see HPI CV: Denies chest pain, heart palpitations, peripheral edema, syncope.  Resp: +DOE GI: SEE HPI GU : stress incontinence MS: +joint pain, ankle pain, arthritis Derm: Denies rash, itching, dry skin Psych: short-term memory loss  Heme: Denies bruising, bleeding, and enlarged lymph nodes.  Physical Exam: BP 135/72  Pulse 78  Temp(Src) 98.4 F (36.9 C) (Oral)  Ht 5\' 4"  (1.626 m)  Wt 179 lb 12.8 oz (81.557 kg)  BMI 30.85 kg/m2 General:   Alert and oriented. Pleasant and cooperative. Well-nourished and well-developed.  Head:  Normocephalic and  atraumatic. Eyes:  Without icterus, sclera clear and conjunctiva pink.  Ears:  Normal auditory acuity. Nose:  No deformity, discharge,  or lesions. Mouth:  No deformity or lesions, oral mucosa pink.  Neck:  Supple, without mass or thyromegaly. Lungs:  Clear to auscultation bilaterally. No wheezes, rales, or rhonchi. No distress.  Heart:  S1, S2 present without murmurs appreciated.  Abdomen:  +BS, soft, non-tender and non-distended. No HSM noted. No guarding or rebound. No masses appreciated.  Rectal:  No thrombosed hemorrhoids, no overt evidence of anal fissure, internal exam with mild discomfort, good sphincter tone, no mass appreciated.   Msk:  Symmetrical without gross deformities. Normal posture. Extremities:  Without clubbing or edema. Neurologic:  Alert and  oriented x4;  grossly normal neurologically. Skin:  Intact without significant lesions or rashes. Cervical Nodes:  No significant cervical adenopathy. Psych:  Alert and cooperative. Normal mood and affect.

## 2012-07-11 NOTE — Patient Instructions (Addendum)
We have scheduled you for a colonoscopy with Dr. Darrick Penna in the future.  In the meantime, start taking the suppositories twice a day for 6 days. This is for rectal discomfort.   For constipation: I have provided samples of a medication called Amitiza. Take 1 capsule WITH FOOD TO AVOID NAUSEA twice a day. Please call us if you start having loose stools or if you have no improvement.

## 2012-07-13 NOTE — Assessment & Plan Note (Signed)
Last TCS in 2011. Due to new onset rectal bleeding, proceed with TCS.

## 2012-07-13 NOTE — Assessment & Plan Note (Signed)
57 year old female with a personal history of colon cancer, s/p resection, with last surveillance colonoscopy by Dr. Darrick Penna in 2011. Findings of normal anastomosis, internal hemorrhoids, tubular adenoma, and rare sigmoid diverticula. Now with recurrent rectal bleeding, rectal discomfort. Physical exam without overt evidence of anal fissure or other concern; may be benign anorectal source, specifically in the setting of known internal hemorrhoids. However, she also notes occasional fecal smears, which is chronic. Due to her personal history of colon cancer, we will proceed with updated surveillance now. Anusol BID until further evaluation via colonoscopy. Constipation noted; may benefit from Amitiza or Linzess. Will wait for lower GI evaluation first.   Proceed with colonoscopy with Dr. Darrick Penna in the near future. The risks, benefits, and alternatives have been discussed in detail with the patient. They state understanding and desire to proceed.

## 2012-07-16 ENCOUNTER — Telehealth: Payer: Self-pay | Admitting: Gastroenterology

## 2012-07-16 MED ORDER — PEG 3350-KCL-NA BICARB-NACL 420 G PO SOLR: 4000.0000 mL | ORAL | Status: DC

## 2012-07-16 NOTE — Telephone Encounter (Signed)
Pt was seen by AS on 4/30 and LMOM over the weekend that the new medicine was giving her diarrhea and could she cut the medicine in half. Also, she was asking to get her medical records for disability. I told her on 4/30 that there would be a charge and if she would wait that normally the disability center would send a request for records where there would be no charge to her, but she would rather get them herself. I will work on this for her. [phone] Please advise about her medication

## 2012-07-16 NOTE — Telephone Encounter (Signed)
Called and informed pt.  

## 2012-07-16 NOTE — Addendum Note (Signed)
Addended by: Lavena Bullion on: 07/16/2012 09:14 AM   Modules accepted: Orders

## 2012-07-16 NOTE — Progress Notes (Signed)
Cc PCP 

## 2012-07-16 NOTE — Telephone Encounter (Signed)
She has a history of internal hemorrhoids. Stay on Amitiza once per day. If she has more diarrhea, then only use Miralax prn.

## 2012-07-16 NOTE — Telephone Encounter (Signed)
I called pt. She said she has been taking the Amitiza 8 mcg bid and having diarrhea. I asked her had she cut back to one aday, and she said she did and it helped. She had some more blood in stool once and thought that was coming from the pill. She wants Tobi Bastos to advise if she should continue the medicine or to make recommendations.

## 2012-07-20 ENCOUNTER — Telehealth: Payer: Self-pay

## 2012-07-23 ENCOUNTER — Ambulatory Visit (HOSPITAL_COMMUNITY)
Admission: RE | Admit: 2012-07-23 | Discharge: 2012-07-23 | Disposition: A | Discharge status: Home / Self Care | Payer: Medicaid Other | Source: Ambulatory Visit | Attending: Gastroenterology | Admitting: Gastroenterology

## 2012-07-23 ENCOUNTER — Encounter (HOSPITAL_COMMUNITY): Payer: Self-pay | Admitting: *Deleted

## 2012-07-23 ENCOUNTER — Encounter (HOSPITAL_COMMUNITY)
Admission: RE | Disposition: A | Discharge status: Home / Self Care | Payer: Self-pay | Source: Ambulatory Visit | Attending: Gastroenterology

## 2012-07-23 DIAGNOSIS — K620 Anal polyp: Secondary | ICD-10-CM

## 2012-07-23 DIAGNOSIS — J4489 Other specified chronic obstructive pulmonary disease: Secondary | ICD-10-CM | POA: Insufficient documentation

## 2012-07-23 DIAGNOSIS — Z86010 Personal history of colonic polyps: Secondary | ICD-10-CM

## 2012-07-23 DIAGNOSIS — D126 Benign neoplasm of colon, unspecified: Secondary | ICD-10-CM

## 2012-07-23 DIAGNOSIS — K621 Rectal polyp: Secondary | ICD-10-CM

## 2012-07-23 DIAGNOSIS — K625 Hemorrhage of anus and rectum: Secondary | ICD-10-CM | POA: Insufficient documentation

## 2012-07-23 DIAGNOSIS — J449 Chronic obstructive pulmonary disease, unspecified: Secondary | ICD-10-CM | POA: Insufficient documentation

## 2012-07-23 DIAGNOSIS — Z85038 Personal history of other malignant neoplasm of large intestine: Secondary | ICD-10-CM

## 2012-07-23 DIAGNOSIS — D128 Benign neoplasm of rectum: Secondary | ICD-10-CM | POA: Insufficient documentation

## 2012-07-23 DIAGNOSIS — K648 Other hemorrhoids: Secondary | ICD-10-CM | POA: Insufficient documentation

## 2012-07-23 HISTORY — PX: COLONOSCOPY: SHX5424

## 2012-07-23 SURGERY — COLONOSCOPY
Anesthesia: Moderate Sedation

## 2012-07-23 MED ORDER — MIDAZOLAM HCL 5 MG/5ML IJ SOLN
INTRAMUSCULAR | Status: DC | PRN
  Administered 2012-07-23 (×2): 2 mg via INTRAVENOUS

## 2012-07-23 MED ORDER — PROMETHAZINE HCL 25 MG/ML IJ SOLN
INTRAMUSCULAR | Status: AC
  Filled 2012-07-23: qty 1

## 2012-07-23 MED ORDER — MIDAZOLAM HCL 5 MG/5ML IJ SOLN
INTRAMUSCULAR | Status: AC
  Filled 2012-07-23: qty 10

## 2012-07-23 MED ORDER — SODIUM CHLORIDE 0.9 % IV SOLN
INTRAVENOUS | Status: DC
  Administered 2012-07-23: 10:00:00 via INTRAVENOUS

## 2012-07-23 MED ORDER — MEPERIDINE HCL 100 MG/ML IJ SOLN
INTRAMUSCULAR | Status: DC | PRN
  Administered 2012-07-23 (×2): 25 mg via INTRAVENOUS

## 2012-07-23 MED ORDER — MEPERIDINE HCL 100 MG/ML IJ SOLN
INTRAMUSCULAR | Status: AC
  Filled 2012-07-23: qty 1

## 2012-07-23 MED ORDER — PROMETHAZINE HCL 25 MG/ML IJ SOLN
INTRAMUSCULAR | Status: DC | PRN
  Administered 2012-07-23: 12.5 mg via INTRAVENOUS

## 2012-07-23 NOTE — Op Note (Signed)
[facility] [address] [place], [address]   COLONOSCOPY PROCEDURE REPORT  PATIENT: Paula Carrillo  MR#: [id number] BIRTHDATE: February 18, 1956 , 56  yrs. old GENDER: Female ENDOSCOPIST: Jonette Eva, MD REFERRED XB:JYNW Loney Hering, M.D. PROCEDURE DATE:  07/23/2012 PROCEDURE:   Colonoscopy with biopsy and snare polypectomy and Hemorrhoidectomy via banding INDICATIONS:Rectal Bleeding and High risk patient with personal history of colon cancer WITH COLON RESECTION IN 2007. MEDICATIONS: Demerol 50 mg IV, Versed 4 mg IV, and Promethazine (Phenergan) 12.5mg  IV  DESCRIPTION OF PROCEDURE:    Physical exam was performed.  Informed consent was obtained from the patient after explaining the benefits, risks, and alternatives to procedure.  The patient was connected to monitor and placed in left lateral position. Continuous oxygen was provided by nasal cannula and IV medicine administered through an indwelling cannula.  After administration of sedation and rectal exam, the patients rectum was intubated and the EC-3890Li ([id number]) and EG-[id number] ([id number])  colonoscope was advanced under direct visualization to the ileum.  The scope was removed slowly by carefully examining the color, texture, anatomy, and integrity mucosa on the way out.  The patient was recovered in endoscopy and discharged home in satisfactory condition.    COLON FINDINGS: The mucosa appeared normal in the terminal ileum.  , A single polyp measuring 6 mm in size was found in the ascending colon & REMOVED VIA snare cautery. BASE CONTINUED TO OOZE & METAL CLIP PLACED TO CONTROL BLEEDING.Two sessile polyps ranging between 3-79mm in size were found in the rectum. & REMOVED VIA cold forceps/snare cautery.  Large internal hemorrhoids were found. There was no diverticulosis, inflammation, or cancers unless previously stated.  NORMAL ANASTOMOSIS. The colon Is redundant. PREP QUALITY: good.     CECAL W/D TIME: 21  minutes COMPLICATIONS: None  ENDOSCOPIC IMPRESSION: 1.   Single polyp in the ascending colon 2.  Two sessile polyps in the rectum 3.   RECTAL BLEEDING DUE TO Large internal hemorrhoids-S/P BANDINGx3   RECOMMENDATIONS: CALL [phone] FOR FEVER, A LARGE AMOUNT OF BLEEDING, OR DIFFICULTY URINATING & GO TO THE NEAREST EMERGENCY ROOM. NO MRI FOR 30 DAYS. USE ANUSOL SUPPOSITORIES IF NEEDS FOR RECTAL PAIN. DRINK WATER TO KEEP URINE LIGHT YELLOW. USE NAPROXEN OR IBUPROFEN TWICE DAILY FOR RECTAL DISCOMFORT. TYLENOL AS NEED FOR ADDITIONAL PAIN RELIEF.USE COLACE TWICE DAILY TO SOFTEN STOOL.  HOLD FOR DIARRHEA BIOPSY RESULT WILL BE BACK IN 7 DAYS. FOLLOW A LOW RESIDUE DIET FOR THE NEXT 2-3 WEEKS. FOLLOW UP ON JUN 4 AT 1115 AM. Next colonoscopy in 3-5 years. CONSIDER WITH AN OVERTUBE.       _______________________________ Rosalie DoctorJonette Eva, MD 07/23/2012 12:34 PM     PATIENT NAME:  Paula Carrillo, Paula Carrillo MR#: [id number]

## 2012-07-23 NOTE — H&P (Signed)
Primary Care Physician:  Ernestine Conrad, MD Primary Gastroenterologist:  Dr. Darrick Penna  Pre-Procedure History & Physical: HPI:  Paula Carrillo is a 57 y.o. female here for  BRBPR- PERSONAL HISTORY OF COLON CANCER.  Past Medical History  Diagnosis Date  . Chronic pain   . Asthma   . Cancer   . Colon cancer   . COPD (chronic obstructive pulmonary disease)   . IBS (irritable bowel syndrome)   . Chronic back pain   . Chronic neck pain   . Chronic abdominal pain   . Hemorrhoids     Past Surgical History  Procedure Laterality Date  . Appendectomy    . Colon resection    . Abdominal hysterectomy    . Breast lumpectomy    . Colonoscopy  07/17/09    ZOX:WRUEA internal hemorrhoids/tortuous colon/3-mm sessile hepatic polyp/sigmoid colon diverticula, small internal hemorrhoids, path: tubular adenoma    Prior to Admission medications   Medication Sig Start Date End Date Taking? Authorizing Provider  albuterol (PROVENTIL HFA;VENTOLIN HFA) 108 (90 BASE) MCG/ACT inhaler Inhale 2 puffs into the lungs every 6 (six) hours as needed. Shortness of breath   Yes Historical Provider, MD  benzonatate (TESSALON) 100 MG capsule Take 100 mg by mouth 3 (three) times daily as needed. cough   Yes Historical Provider, MD  cyclobenzaprine (FLEXERIL) 10 MG tablet Take 10 mg by mouth 3 (three) times daily as needed. Muscle spasms   Yes Historical Provider, MD  fluticasone (VERAMYST) 27.5 MCG/SPRAY nasal spray Place 2 sprays into the nose daily.   Yes Historical Provider, MD  Fluticasone-Salmeterol (ADVAIR) 250-50 MCG/DOSE AEPB Inhale 1 puff into the lungs every 12 (twelve) hours.   Yes Historical Provider, MD  levETIRAcetam (KEPPRA) 500 MG tablet Take 500 mg by mouth daily.   Yes Historical Provider, MD  loratadine (CLARITIN) 10 MG tablet Take 10 mg by mouth daily. allergies   Yes Historical Provider, MD  polyethylene glycol-electrolytes (TRILYTE) 420 G solution Take 4,000 mLs by mouth as directed. 07/16/12  Yes  [place], MD  tiotropium (SPIRIVA) 18 MCG inhalation capsule Place 18 mcg into inhaler and inhale daily.   Yes Historical Provider, MD  hydrocortisone (ANUSOL-HC) 25 MG suppository Place 1 suppository (25 mg total) rectally every 12 (twelve) hours. 07/11/12   Nira Retort, NP    Allergies as of 07/11/2012 - Review Complete 07/11/2012  Allergen Reaction Noted  . Sulfa antibiotics  07/11/2012    Family History  Problem Relation Age of Onset  . Colon cancer Neg Hx     History   Social History  . Marital Status: Legally Separated    Spouse Name: N/A    Number of Children: N/A  . Years of Education: N/A   Occupational History  . Not on file.   Social History Main Topics  . Smoking status: Former Games developer  . Smokeless tobacco: Not on file  . Alcohol Use: No  . Drug Use: No  . Sexually Active: Not on file   Other Topics Concern  . Not on file   Social History Narrative  . No narrative on file    Review of Systems: See HPI, otherwise negative ROS   Physical Exam: BP 118/68  Pulse 56  Temp(Src) 98 F (36.7 C) (Oral)  Resp 19  Ht 5\' 4"  (1.626 m)  Wt 179 lb (81.194 kg)  BMI 30.71 kg/m2  SpO2 97% General:   Alert,  pleasant and cooperative in NAD Head:  Normocephalic and atraumatic.  Neck:  Supple; Lungs:  Clear throughout to auscultation.    Heart:  Regular rate and rhythm. Abdomen:  Soft, nontender and nondistended. Normal bowel sounds, without guarding, and without rebound.   Neurologic:  Alert and  oriented x4;  grossly normal neurologically.  Impression/Plan:    BRBPR  PLAN: TCS TODAY

## 2012-07-24 NOTE — Telephone Encounter (Signed)
Opened in error

## 2012-07-25 ENCOUNTER — Encounter (HOSPITAL_COMMUNITY): Payer: Self-pay | Admitting: Gastroenterology

## 2012-08-13 ENCOUNTER — Encounter: Payer: Self-pay | Admitting: Gastroenterology

## 2012-08-15 ENCOUNTER — Ambulatory Visit (INDEPENDENT_AMBULATORY_CARE_PROVIDER_SITE_OTHER): Payer: Medicaid Other | Admitting: Gastroenterology

## 2012-08-15 ENCOUNTER — Encounter: Payer: Self-pay | Admitting: Gastroenterology

## 2012-08-15 VITALS — BP 109/74 | HR 63 | Temp 98.4°F | Ht 64.0 in | Wt 177.8 lb

## 2012-08-15 DIAGNOSIS — K219 Gastro-esophageal reflux disease without esophagitis: Secondary | ICD-10-CM | POA: Insufficient documentation

## 2012-08-15 DIAGNOSIS — D126 Benign neoplasm of colon, unspecified: Secondary | ICD-10-CM

## 2012-08-15 DIAGNOSIS — K625 Hemorrhage of anus and rectum: Secondary | ICD-10-CM

## 2012-08-15 DIAGNOSIS — R109 Unspecified abdominal pain: Secondary | ICD-10-CM

## 2012-08-15 DIAGNOSIS — Z85038 Personal history of other malignant neoplasm of large intestine: Secondary | ICD-10-CM

## 2012-08-15 MED ORDER — PANTOPRAZOLE SODIUM 40 MG PO TBEC: DELAYED_RELEASE_TABLET | ORAL | Status: DC

## 2012-08-15 MED ORDER — LUBIPROSTONE 8 MCG PO CAPS: 8.0000 ug | ORAL_CAPSULE | Freq: Two times a day (BID) | ORAL | Status: DC

## 2012-08-15 NOTE — Assessment & Plan Note (Signed)
SX CONTROLLED ON PROTONIX.  LOW FAT DIET 10 LB WEIGHT LOSS OPV IN 4 MOS

## 2012-08-15 NOTE — Assessment & Plan Note (Signed)
TCS 2019 

## 2012-08-15 NOTE — Assessment & Plan Note (Signed)
DUE TO HEMORRHOIDS. SX RESOLVED AFTER BANDING.  DRINK WATER TO KEEP YOUR URINE LIGHT YELLOW. EAT FIBER AVOID CONSTIPATION. AMITIZA BID OPV IN 4 MOS TO RE-ASSESS NEED FOR [facility] BANDING, DISCUSSED PROCEDURE WITH PT.

## 2012-08-15 NOTE — Progress Notes (Signed)
Cc PCP 

## 2012-08-15 NOTE — Progress Notes (Signed)
Subjective:    Patient ID: Paula Carrillo, female    DOB: 1955-06-23, 57 y.o.   MRN: [id number]  PCP: BLUTH  HPI WANTS TO KNOW IF SHE NEEDS PROTONIX. AMITIZA WORKS BETTER WITH ONCE A DAY. LAST TOOK SUPP x1 WHEN FELT BOUND UP AND STOOL HARD TO COME OUT. NO MORE RECTAL BLEEDING. NO RECTAL PAIN OR PRESSURE. FEELS NAUSEA: EVERY DAY.  USU. LAYS DOWN. NO NAUSEA MEDS IN > 1 YEAR. FEELS HOT AND THEN COLD. DIARRHEA IF SHE TAKES AMITIZA BID EVERY DAY. STILL HAS MILD TO MODERATE PAIN IN LLQ. DOESN'T REALLY CHANGE WITH BMs OR PASSING GAS. HITS HER 1-2X/WK. HAS A SORE THROAT IS SORE. HAS BEEN HAVING PROBLEMS WITH COUGH/MUCOUS. HEARTBURN FAIRLY WELL CONTROLLED ON PROTONIX QD. QUIT SMOKING 4 YEARS AGO @MOTHER 'S DAY.  PT DENIES FEVER, CHILLS, BRBPR, vomiting, melena,  problems swallowing, problems with sedation. PT IS MEDICAID BUT STILL BORROWS MONEY FOR HER CO-PAYS.  Past Medical History  Diagnosis Date  . Chronic pain   . Asthma   . Cancer   . Colon cancer   . COPD (chronic obstructive pulmonary disease)   . IBS (irritable bowel syndrome)   . Chronic back pain   . Chronic neck pain   . Chronic abdominal pain   . Hemorrhoids    Past Surgical History  Procedure Laterality Date  . Appendectomy    . Colon resection    . Abdominal hysterectomy    . Breast lumpectomy    . Colonoscopy  07/17/09    XBM:[id number] internal hemorrhoids/tortuous colon/3-mm sessile hepatic polyp/sigmoid colon diverticula, small internal hemorrhoids, path: tubular adenoma  . Colonoscopy N/A 07/23/2012    GMW:NUUVOZ polyp in the ascending colon/Two sessile polyps in the rectum/RECTAL BLEEDING DUE TO Large internal hemorrhoids-S/P BANDINGx3   Allergies  Allergen Reactions  . Sulfa Antibiotics     Current Outpatient Prescriptions  Medication Sig Dispense Refill  . albuterol (PROVENTIL HFA;VENTOLIN HFA) 108 (90 BASE) MCG/ACT inhaler Inhale 2 puffs into the lungs every 6 (six) hours as needed. Shortness of breath      .  benzonatate (TESSALON) 100 MG capsule Take 100 mg by mouth 3 (three) times daily as needed. cough      . cyclobenzaprine (FLEXERIL) 10 MG tablet Take 10 mg by mouth 3 (three) times daily as needed. Muscle spasms      . fluticasone (VERAMYST) 27.5 MCG/SPRAY nasal spray Place 2 sprays into the nose daily.      . Fluticasone-Salmeterol (ADVAIR) 250-50 MCG/DOSE AEPB Inhale 1 puff into the lungs every 12 (twelve) hours.      .      . levETIRAcetam (KEPPRA) 500 MG tablet Take 500 mg by mouth daily.      Marland Kitchen loratadine (CLARITIN) 10 MG tablet Take 10 mg by mouth daily. allergies      . lubiprostone (AMITIZA) 8 MCG capsule Take 8 mcg by mouth 2 (two) times daily with a meal.      . tiotropium (SPIRIVA) 18 MCG inhalation capsule Place 18 mcg into inhaler and inhale daily.      . pantoprazole (PROTONIX) 40 MG tablet Take 40 mg by mouth daily.          Review of Systems     Objective:   Physical Exam  Vitals reviewed. Constitutional: She is oriented to person, place, and time. She appears well-nourished. No distress.  HENT:  Head: Normocephalic and atraumatic.  Mouth/Throat: Oropharynx is clear and moist.  Eyes: Pupils are equal, round, and reactive  to light. No scleral icterus.  Neck: Normal range of motion. Neck supple.  Cardiovascular: Normal rate, regular rhythm and normal heart sounds.   Pulmonary/Chest: Effort normal and breath sounds normal. No respiratory distress.  Abdominal: Soft. Bowel sounds are normal. She exhibits no distension. There is tenderness. There is no rebound and no guarding.  MILD TTP x4   Musculoskeletal: She exhibits no edema.  Lymphadenopathy:    She has no cervical adenopathy.  Neurological: She is alert and oriented to person, place, and time.  NO  NEW FOCAL DEFICITS   Psychiatric:  FLAT AFFECT, NL MOOD           Assessment & Plan:

## 2012-08-15 NOTE — Assessment & Plan Note (Signed)
PERSONAL HISTORY COLON CA IN 2007   REVIEWED TCS AND PATH. TCS IN 2019 WITH OVERTUBE.

## 2012-08-15 NOTE — Patient Instructions (Addendum)
LOSE 10 LBS.  DRINK WATER TO KEEP YOUR URINE LIGHT YELLOW.  FOLLOW A HIGH FIBER/LOW FAT DIET. AVOID ITEMS THAT CAUSE BLOATING & GAS. SEE INFO BELOW.  AVOID CONSTIPATION. CONTINUE AMITIZA.  FOLLOW UP IN 4 MOS.   High-Fiber Diet A high-fiber diet changes your normal diet to include more whole grains, legumes, fruits, and vegetables. Changes in the diet involve replacing refined carbohydrates with unrefined foods. The calorie level of the diet is essentially unchanged. The Dietary Reference Intake (recommended amount) for adult males is 38 grams per day. For adult females, it is 25 grams per day. Pregnant and lactating women should consume 28 grams of fiber per day. Fiber is the intact part of a plant that is not broken down during digestion. Functional fiber is fiber that has been isolated from the plant to provide a beneficial effect in the body. PURPOSE  Increase stool bulk.   Ease and regulate bowel movements.   Lower cholesterol.  INDICATIONS THAT YOU NEED MORE FIBER  Constipation and hemorrhoids.   Uncomplicated diverticulosis (intestine condition) and irritable bowel syndrome.   Weight management.   As a protective measure against hardening of the arteries (atherosclerosis), diabetes, and cancer.   GUIDELINES FOR INCREASING FIBER IN THE DIET  Start adding fiber to the diet slowly. A gradual increase of about 5 more grams (2 slices of whole-wheat bread, 2 servings of most fruits or vegetables, or 1 bowl of high-fiber cereal) per day is best. Too rapid an increase in fiber may result in constipation, flatulence, and bloating.   Drink enough water and fluids to keep your urine clear or pale yellow. Water, juice, or caffeine-free drinks are recommended. Not drinking enough fluid may cause constipation.   Eat a variety of high-fiber foods rather than one type of fiber.   Try to increase your intake of fiber through using high-fiber foods rather than fiber pills or supplements  that contain small amounts of fiber.   The goal is to change the types of food eaten. Do not supplement your present diet with high-fiber foods, but replace foods in your present diet.  INCLUDE A VARIETY OF FIBER SOURCES  Replace refined and processed grains with whole grains, canned fruits with fresh fruits, and incorporate other fiber sources. White rice, white breads, and most bakery goods contain little or no fiber.   Brown whole-grain rice, buckwheat oats, and many fruits and vegetables are all good sources of fiber. These include: broccoli, Brussels sprouts, cabbage, cauliflower, beets, sweet potatoes, white potatoes (skin on), carrots, tomatoes, eggplant, squash, berries, fresh fruits, and dried fruits.   Cereals appear to be the richest source of fiber. Cereal fiber is found in whole grains and bran. Bran is the fiber-rich outer coat of cereal grain, which is largely removed in refining. In whole-grain cereals, the bran remains. In breakfast cereals, the largest amount of fiber is found in those with "bran" in their names. The fiber content is sometimes indicated on the label.   You may need to include additional fruits and vegetables each day.   In baking, for 1 cup white flour, you may use the following substitutions:   1 cup whole-wheat flour minus 2 tablespoons.   1/2 cup white flour plus 1/2 cup whole-wheat flour.   Low-Fat Diet BREADS, CEREALS, PASTA, RICE, DRIED PEAS, AND BEANS These products are high in carbohydrates and most are low in fat. Therefore, they can be increased in the diet as substitutes for fatty foods. They too, however, contain calories  and should not be eaten in excess. Cereals can be eaten for snacks as well as for breakfast.   FRUITS AND VEGETABLES It is good to eat fruits and vegetables. Besides being sources of fiber, both are rich in vitamins and some minerals. They help you get the daily allowances of these nutrients. Fruits and vegetables can be used  for snacks and desserts.  MEATS Limit lean meat, chicken, [place], and fish to no more than 6 ounces per day. Beef, Pork, and Lamb Use lean cuts of beef, pork, and lamb. Lean cuts include:  Extra-lean ground beef.  Arm roast.  Sirloin tip.  Center-cut ham.  Round steak.  Loin chops.  Rump roast.  Tenderloin.  Trim all fat off the outside of meats before cooking. It is not necessary to severely decrease the intake of red meat, but lean choices should be made. Lean meat is rich in protein and contains a highly absorbable form of iron. Premenopausal women, in particular, should avoid reducing lean red meat because this could increase the risk for low red blood cells (iron-deficiency anemia).  Chicken and [place] These are good sources of protein. The fat of poultry can be reduced by removing the skin and underlying fat layers before cooking. Chicken and [place] can be substituted for lean red meat in the diet. Poultry should not be fried or covered with high-fat sauces. Fish and Shellfish Fish is a good source of protein. Shellfish contain cholesterol, but they usually are low in saturated fatty acids. The preparation of fish is important. Like chicken and [place], they should not be fried or covered with high-fat sauces. EGGS Egg whites contain no fat or cholesterol. They can be eaten often. Try 1 to 2 egg whites instead of whole eggs in recipes or use egg substitutes that do not contain yolk. MILK AND DAIRY PRODUCTS Use skim or 1% milk instead of 2% or whole milk. Decrease whole milk, natural, and processed cheeses. Use nonfat or low-fat (2%) cottage cheese or low-fat cheeses made from vegetable oils. Choose nonfat or low-fat (1 to 2%) yogurt. Experiment with evaporated skim milk in recipes that call for heavy cream. Substitute low-fat yogurt or low-fat cottage cheese for sour cream in dips and salad dressings. Have at least 2 servings of low-fat dairy products, such as 2 glasses of skim (or 1%)  milk each day to help get your daily calcium intake. FATS AND OILS Reduce the total intake of fats, especially saturated fat. Butterfat, lard, and beef fats are high in saturated fat and cholesterol. These should be avoided as much as possible. Vegetable fats do not contain cholesterol, but certain vegetable fats, such as coconut oil, palm oil, and palm kernel oil are very high in saturated fats. These should be limited. These fats are often used in bakery goods, processed foods, popcorn, oils, and nondairy creamers. Vegetable shortenings and some peanut butters contain hydrogenated oils, which are also saturated fats. Read the labels on these foods and check for saturated vegetable oils. Unsaturated vegetable oils and fats do not raise blood cholesterol. However, they should be limited because they are fats and are high in calories. Total fat should still be limited to 30% of your daily caloric intake. Desirable liquid vegetable oils are corn oil, cottonseed oil, olive oil, canola oil, safflower oil, soybean oil, and sunflower oil. Peanut oil is not as good, but small amounts are acceptable. Buy a heart-healthy tub margarine that has no partially hydrogenated oils in the ingredients. Mayonnaise and salad  dressings often are made from unsaturated fats, but they should also be limited because of their high calorie and fat content. Seeds, nuts, peanut butter, olives, and avocados are high in fat, but the fat is mainly the unsaturated type. These foods should be limited mainly to avoid excess calories and fat. OTHER EATING TIPS Snacks  Most sweets should be limited as snacks. They tend to be rich in calories and fats, and their caloric content outweighs their nutritional value. Some good choices in snacks are graham crackers, melba toast, soda crackers, bagels (no egg), English muffins, fruits, and vegetables. These snacks are preferable to snack crackers, [place] fries, TORTILLA CHIPS, and POTATO chips. Popcorn  should be air-popped or cooked in small amounts of liquid vegetable oil. Desserts Eat fruit, low-fat yogurt, and fruit ices instead of pastries, cake, and cookies. Sherbet, angel food cake, gelatin dessert, frozen low-fat yogurt, or other frozen products that do not contain saturated fat (pure fruit juice bars, frozen ice pops) are also acceptable.  COOKING METHODS Choose those methods that use little or no fat. They include: Poaching.  Braising.  Steaming.  Grilling.  Baking.  Stir-frying.  Broiling.  Microwaving.  Foods can be cooked in a nonstick pan without added fat, or use a nonfat cooking spray in regular cookware. Limit fried foods and avoid frying in saturated fat. Add moisture to lean meats by using water, broth, cooking wines, and other nonfat or low-fat sauces along with the cooking methods mentioned above. Soups and stews should be chilled after cooking. The fat that forms on top after a few hours in the refrigerator should be skimmed off. When preparing meals, avoid using excess salt. Salt can contribute to raising blood pressure in some people.  EATING AWAY FROM HOME Order entres, potatoes, and vegetables without sauces or butter. When meat exceeds the size of a deck of cards (3 to 4 ounces), the rest can be taken home for another meal. Choose vegetable or fruit salads and ask for low-calorie salad dressings to be served on the side. Use dressings sparingly. Limit high-fat toppings, such as bacon, crumbled eggs, cheese, sunflower seeds, and olives. Ask for heart-healthy tub margarine instead of butter.

## 2012-08-15 NOTE — Assessment & Plan Note (Signed)
MOST LIKELY DUE TO CONSTIPATION, ? IBS, AND ABD WALL PAIN DUE TO COUGHING.  MONITOR SX TREAT CONSTIPATION. OPV IN 4 MOS

## 2012-08-21 NOTE — Progress Notes (Signed)
Reminder in epic °

## 2012-10-01 ENCOUNTER — Telehealth: Payer: Self-pay

## 2012-10-01 NOTE — Telephone Encounter (Signed)
Pt is calling to make an appointment for hemorrhoids. Do you want her to have a banding this week anytime or can she wait? Please advise

## 2012-10-02 NOTE — Telephone Encounter (Signed)
Pt is aware of appointment for banding

## 2012-10-02 NOTE — Telephone Encounter (Signed)
PRE-CERT FOR [facility] BANDING Oct 11 1128 PM, DX: INTERNAL HEMORRHOIDS WITH COMPLICATIONS(455.2)

## 2012-10-11 ENCOUNTER — Encounter: Payer: Self-pay | Admitting: Gastroenterology

## 2012-10-11 ENCOUNTER — Ambulatory Visit (INDEPENDENT_AMBULATORY_CARE_PROVIDER_SITE_OTHER): Payer: Medicaid Other | Admitting: Gastroenterology

## 2012-10-11 VITALS — BP 110/60 | HR 65 | Temp 98.4°F | Ht 64.0 in | Wt 177.2 lb

## 2012-10-11 DIAGNOSIS — K648 Other hemorrhoids: Secondary | ICD-10-CM

## 2012-10-11 DIAGNOSIS — K603 Anal fistula, unspecified: Secondary | ICD-10-CM | POA: Insufficient documentation

## 2012-10-11 DIAGNOSIS — K649 Unspecified hemorrhoids: Secondary | ICD-10-CM | POA: Insufficient documentation

## 2012-10-11 MED ORDER — NITROGLYCERIN 0.4 % RE OINT: 1.0000 [drp] | TOPICAL_OINTMENT | Freq: Three times a day (TID) | RECTAL | Status: DC

## 2012-10-11 NOTE — Assessment & Plan Note (Signed)
RECTIV FOR 3 MOS. PT STATES SHE MAY NOT E ABLE TO AFFORD $3 CO-PAY.

## 2012-10-11 NOTE — Patient Instructions (Signed)
DRINK WATER TO KEEP YOUR URINE LIGHT YELLOW.  FOLLOW A HIGH FIBER DIET. AVOID ITEMS THAT CAUSE BLOATING & GAS. SEE INFO BELOW.  USE RECTIV 1 DROP TID FOR 3 MOS.  FOLLOW UP AUG 14 AT 1130.  High-Fiber Diet A high-fiber diet changes your normal diet to include more whole grains, legumes, fruits, and vegetables. Changes in the diet involve replacing refined carbohydrates with unrefined foods. The calorie level of the diet is essentially unchanged. The Dietary Reference Intake (recommended amount) for adult males is 38 grams per day. For adult females, it is 25 grams per day. Pregnant and lactating women should consume 28 grams of fiber per day. Fiber is the intact part of a plant that is not broken down during digestion. Functional fiber is fiber that has been isolated from the plant to provide a beneficial effect in the body. PURPOSE  Increase stool bulk.   Ease and regulate bowel movements.   Lower cholesterol.  INDICATIONS THAT YOU NEED MORE FIBER  Constipation and hemorrhoids.   Uncomplicated diverticulosis (intestine condition) and irritable bowel syndrome.   Weight management.   As a protective measure against hardening of the arteries (atherosclerosis), diabetes, and cancer.   GUIDELINES FOR INCREASING FIBER IN THE DIET  Start adding fiber to the diet slowly. A gradual increase of about 5 more grams (2 slices of whole-wheat bread, 2 servings of most fruits or vegetables, or 1 bowl of high-fiber cereal) per day is best. Too rapid an increase in fiber may result in constipation, flatulence, and bloating.   Drink enough water and fluids to keep your urine clear or pale yellow. Water, juice, or caffeine-free drinks are recommended. Not drinking enough fluid may cause constipation.   Eat a variety of high-fiber foods rather than one type of fiber.   Try to increase your intake of fiber through using high-fiber foods rather than fiber pills or supplements that contain small amounts  of fiber.   The goal is to change the types of food eaten. Do not supplement your present diet with high-fiber foods, but replace foods in your present diet.  INCLUDE A VARIETY OF FIBER SOURCES  Replace refined and processed grains with whole grains, canned fruits with fresh fruits, and incorporate other fiber sources. White rice, white breads, and most bakery goods contain little or no fiber.   Brown whole-grain rice, buckwheat oats, and many fruits and vegetables are all good sources of fiber. These include: broccoli, Brussels sprouts, cabbage, cauliflower, beets, sweet potatoes, white potatoes (skin on), carrots, tomatoes, eggplant, squash, berries, fresh fruits, and dried fruits.   Cereals appear to be the richest source of fiber. Cereal fiber is found in whole grains and bran. Bran is the fiber-rich outer coat of cereal grain, which is largely removed in refining. In whole-grain cereals, the bran remains. In breakfast cereals, the largest amount of fiber is found in those with "bran" in their names. The fiber content is sometimes indicated on the label.   You may need to include additional fruits and vegetables each day.   In baking, for 1 cup white flour, you may use the following substitutions:   1 cup whole-wheat flour minus 2 tablespoons.   1/2 cup white flour plus 1/2 cup whole-wheat flour.

## 2012-10-11 NOTE — Assessment & Plan Note (Signed)
OPV AUG 14 FOR R ANT [facility] BAND

## 2012-10-11 NOTE — Progress Notes (Signed)
Patient ID: Paula Carrillo, female   DOB: 03/27/1955, 57 y.o.   MRN: [id number] SYMPTOMS: RECTAL BLEEDING, PAIN, ITCHING, BURNING, SOILING  CONSTIPATION:YES  DIARRHEA: YES  STRAINS WITH BMs: YES  TIME SPENT ON TOILET: 15-20 MINS TISSUE POKES OUT OF RECTUM: YES FIBER SUPPLEMENTS: YES   GLASSES OF WATER/DAY: 6-8: NO   ADDITIONAL QUESTIONS:  LATEX ALLERGY: NO PREGNANT: NO ERECTILE DYSFUNCTION MEDS OR NITRATES: NO ANTICOAGULATION/ANTIPLATELET MEDS: NO DIAGNOSED WITH CROHN'S DISEASE, PROCTITIS, PORTAL HTN, OR ANAL/RECTAL CA: NO TAKING IMMUNOSUPPRESSANTS/XRT: NO  PLAN: 1. ANOSCOPY THEN [facility] BANDING   PROCEDURE TECHNIQUE: BENEFITS RISK EXPLAINED TO PT.  PROCEDURE; ANOSCOPY/[facility] BANDING  SCOPE PLACED. REDUNDANT TISSUE IN R ANTERIOR AND RIGHT POSTERIOR HEMORRHOID BUNDLE. NO REDUNDANT TISSUE IN LEFT LATERAL HEMORRHOID BUNDLE. ONE [facility] BAND PLACED IN RIGHT POSTERIOR POSITION. POST-BANDING RECTAL EXAM REVEALED GOOD PLACEMENT. EXAM NON-TENDER.

## 2012-10-23 ENCOUNTER — Telehealth: Payer: Self-pay | Admitting: *Deleted

## 2012-10-23 NOTE — Telephone Encounter (Signed)
I called pt. She said she had 3 BM's yesterday and 2 thus far today. She had a lot of abdominal cramping yesterday continuously but today the cramping is intermittent. She had dark red blood on tissue each BM and once she had a clot. She has appt on 10/25/2012. Please advise!

## 2012-10-23 NOTE — Telephone Encounter (Signed)
Pt called stating she has a appt. Next Thursday for a bandage pt states she is cramping and bleeding and wanted to know if she can come in sooner, please advise [phone]

## 2012-10-24 NOTE — Telephone Encounter (Signed)
PLEASE CALL PT. SHE MAY SEE RECTAL BLEEDING DUE TO HEMORRHOIDS. SHE NEEDS TO COMPLETE HER BANDING. HER ABDOMINAL PAIN IS MOST LIKELY DUE TO IBS.

## 2012-10-24 NOTE — Telephone Encounter (Signed)
LMOM to call back

## 2012-10-25 ENCOUNTER — Encounter: Payer: Self-pay | Admitting: Gastroenterology

## 2012-10-25 ENCOUNTER — Ambulatory Visit (INDEPENDENT_AMBULATORY_CARE_PROVIDER_SITE_OTHER): Payer: Medicaid Other | Admitting: Gastroenterology

## 2012-10-25 VITALS — BP 111/72 | HR 61 | Temp 98.2°F | Ht 66.0 in | Wt 179.6 lb

## 2012-10-25 DIAGNOSIS — K648 Other hemorrhoids: Secondary | ICD-10-CM

## 2012-10-25 NOTE — Assessment & Plan Note (Signed)
ENCOURAGED PT TO PICK UP RECTIV. EXPLAINED BENEFITS/RISKS OF MEDICATION. OPV NOV 2014

## 2012-10-25 NOTE — Progress Notes (Signed)
cc'd tp pcp 

## 2012-10-25 NOTE — Telephone Encounter (Signed)
Pt has appt today

## 2012-10-25 NOTE — Progress Notes (Signed)
SYMPTOMS: RECTAL BLEEDING: LAST BLOOD A LITTLE YESTERDAY, MON/TUES A WHOLE LOT), PRESSURE: WORSE: SAME SINCE BANDING BUT GETS WORSE AT TIMES, PAIN: BURNING/SHARP, JUST ABOUT ALL THE TIME; SAME SINCE BANDING), ITCHING: SAME, SOILING: LITTLE SQUIRTS IN HER UNDERWEAR: MAY SEE WITH SOLID AND LIQUID STOOLS. DIDN'T GET RECTIV FILLED BECAUSE SHE DIDN'T HAVE A RIDE OR THE CO-PAY.  CONSTIPATION: YES-NO bm for a good while THEN MON: FORMED, THEN LOOSE AND THEN WATERY. DIARRHEA: YES: TODAY BACK TO NL.  STRAINS WITH BMs: YES  TIME SPENT ON TOILET: 10-15 MINS TISSUE POKES OUT OF RECTUM: YES- GOES BACK BY ITSELF FIBER SUPPLEMENTS: YES-FIBER IN HER FOOD GLASSES OF WATER/DAY: 6-8: YES   ADDITIONAL QUESTIONS:  LATEX ALLERGY: NO PREGNANT: NO ERECTILE DYSFUNCTION MEDS OR NITRATES: NO ANTICOAGULATION/ANTIPLATELET MEDS: NO DIAGNOSED WITH CROHN'S DISEASE, PROCTITIS, PORTAL HTN, OR ANAL/RECTAL CA: NO TAKING IMMUNOSUPPRESSANTS/XRT: NO  RECTAL: TTP IN R ANT > R POS MIDLINE  PLAN: 1. [facility] BAND PLACEMENT TODAY   PROCEDURE TECHNIQUE: BENEFITS RISK EXPLAINED TO PT. RECTAL EXAM PERFORMED ONE [facility] BAND PLACED IN RIGHT ANTERIOR POSITION. POST-BANDING RECTAL EXAM REVEALED GOOD PLACEMENT. EXAM NON-TENDER AT [facility].

## 2012-10-25 NOTE — Assessment & Plan Note (Signed)
DRINK WATER TO KEEP YOUR URINE LIGHT YELLOW.  FOLLOW A HIGH FIBER/LOW FAT DIET. AVOID ITEMS THAT CAUSE BLOATING & GAS.  AVOID CONSTIPATION. CONTINUE AMITIZA.  FOLLOW UP IN NOV 2014.

## 2012-10-25 NOTE — Patient Instructions (Signed)
FOLLOW A HIGH FIBER DIET. AVOID ITEMS THAT CAUSE BLOATING AND GAS. SEE INFO BELOW.  DRINK WATER TO KEEP HER URINE LIGHT YELLOW.  Use NITROGLYCERIN ointment  three times a day for 3 MOS.  SIT FOR LESS THAN 5 MINUTES ON THE COMMODE.  FOLLOW UP NOV 2014.     High-Fiber Diet A high-fiber diet changes your normal diet to include more whole grains, legumes, fruits, and vegetables. Changes in the diet involve replacing refined carbohydrates with unrefined foods. The calorie level of the diet is essentially unchanged. The Dietary Reference Intake (recommended amount) for adult males is 38 grams per day. For adult females, it is 25 grams per day. Pregnant and lactating women should consume 28 grams of fiber per day. Fiber is the intact part of a plant that is not broken down during digestion. Functional fiber is fiber that has been isolated from the plant to provide a beneficial effect in the body. PURPOSE  Increase stool bulk.   Ease and regulate bowel movements.   Lower cholesterol.  INDICATIONS THAT YOU NEED MORE FIBER  Constipation and hemorrhoids.   Uncomplicated diverticulosis (intestine condition) and irritable bowel syndrome.   Weight management.   As a protective measure against hardening of the arteries (atherosclerosis), diabetes, and cancer.   DO NOT USE WITH:  Acute diverticulitis (intestine infection).   Partial small bowel obstructions.   Complicated diverticular disease involving bleeding, rupture (perforation), or abscess (boil, furuncle).   Presence of autonomic neuropathy (nerve damage) or gastroparesis (stomach cannot empty itself).    GUIDELINES FOR INCREASING FIBER IN THE DIET  Start adding fiber to the diet slowly. A gradual increase of about 5 more grams (2 slices of whole-wheat bread, 2 servings of most fruits or vegetables, or 1 bowl of high-fiber cereal) per day is best. Too rapid an increase in fiber may result in constipation, flatulence, and  bloating.   Drink enough water and fluids to keep your urine clear or pale yellow. Water, juice, or caffeine-free drinks are recommended. Not drinking enough fluid may cause constipation.   Eat a variety of high-fiber foods rather than one type of fiber.   Try to increase your intake of fiber through using high-fiber foods rather than fiber pills or supplements that contain small amounts of fiber.   The goal is to change the types of food eaten. Do not supplement your present diet with high-fiber foods, but replace foods in your present diet.    INCLUDE A VARIETY OF FIBER SOURCES  Replace refined and processed grains with whole grains, canned fruits with fresh fruits, and incorporate other fiber sources. White rice, white breads, and most bakery goods contain little or no fiber.   Brown whole-grain rice, buckwheat oats, and many fruits and vegetables are all good sources of fiber. These include: broccoli, Brussels sprouts, cabbage, cauliflower, beets, sweet potatoes, white potatoes (skin on), carrots, tomatoes, eggplant, squash, berries, fresh fruits, and dried fruits.   Cereals appear to be the richest source of fiber. Cereal fiber is found in whole grains and bran. Bran is the fiber-rich outer coat of cereal grain, which is largely removed in refining. In whole-grain cereals, the bran remains. In breakfast cereals, the largest amount of fiber is found in those with "bran" in their names. The fiber content is sometimes indicated on the label.   You may need to include additional fruits and vegetables each day.   In baking, for 1 cup white flour, you may use the following substitutions:  1 cup whole-wheat flour minus 2 tablespoons.   1/2 cup white flour plus 1/2 cup whole-wheat flour.    Constipation in Adults Constipation is having fewer than 2 bowel movements per week. Usually, the stools are hard. As we grow older, constipation is more common. If you try to fix constipation with  laxatives, the problem may get worse. This is because laxatives taken over a long period of time make the colon muscles weaker. A low-fiber diet, not taking in enough fluids, and taking some medicines may make these problems worse.   HOME CARE INSTRUCTIONS  Constipation is usually best cared for without medicines. Increasing dietary fiber and eating more fruits and vegetables is the best way to manage constipation.   Slowly increase fiber intake to 25 to 38 grams per day. Whole grains, fruits, vegetables, and legumes are good sources of fiber. A dietitian can further help you incorporate high-fiber foods into your diet.   Drink enough water and fluids to keep your urine clear or pale yellow.   A fiber supplement may be added to your diet if you cannot get enough fiber from foods.   Increasing your activities also helps improve regularity.   Stronger measures, such as magnesium sulfate, should be avoided if possible. This may cause uncontrollable diarrhea. Using magnesium sulfate may not allow you time to make it to the bathroom.

## 2012-12-06 ENCOUNTER — Encounter: Payer: Self-pay | Admitting: Gastroenterology

## 2013-01-05 NOTE — Progress Notes (Signed)
REVIEWED. MAY 2014: SIMPLE ADENOMA(1), IH BANDING x 3. [facility] BANDING(2)

## 2013-05-01 ENCOUNTER — Emergency Department (HOSPITAL_COMMUNITY): Payer: Medicare Other

## 2013-05-01 ENCOUNTER — Emergency Department (HOSPITAL_COMMUNITY)
Admission: EM | Admit: 2013-05-01 | Discharge: 2013-05-01 | Disposition: A | Discharge status: Home / Self Care | Payer: Medicare Other | Attending: Emergency Medicine | Admitting: Emergency Medicine

## 2013-05-01 DIAGNOSIS — R279 Unspecified lack of coordination: Secondary | ICD-10-CM | POA: Insufficient documentation

## 2013-05-01 DIAGNOSIS — Z8739 Personal history of other diseases of the musculoskeletal system and connective tissue: Secondary | ICD-10-CM | POA: Insufficient documentation

## 2013-05-01 DIAGNOSIS — R51 Headache: Secondary | ICD-10-CM | POA: Insufficient documentation

## 2013-05-01 DIAGNOSIS — Z87891 Personal history of nicotine dependence: Secondary | ICD-10-CM | POA: Insufficient documentation

## 2013-05-01 DIAGNOSIS — M2550 Pain in unspecified joint: Secondary | ICD-10-CM | POA: Insufficient documentation

## 2013-05-01 DIAGNOSIS — Z862 Personal history of diseases of the blood and blood-forming organs and certain disorders involving the immune mechanism: Secondary | ICD-10-CM | POA: Insufficient documentation

## 2013-05-01 DIAGNOSIS — R209 Unspecified disturbances of skin sensation: Secondary | ICD-10-CM | POA: Insufficient documentation

## 2013-05-01 DIAGNOSIS — Z79899 Other long term (current) drug therapy: Secondary | ICD-10-CM | POA: Insufficient documentation

## 2013-05-01 DIAGNOSIS — Z8639 Personal history of other endocrine, nutritional and metabolic disease: Secondary | ICD-10-CM | POA: Insufficient documentation

## 2013-05-01 DIAGNOSIS — J441 Chronic obstructive pulmonary disease with (acute) exacerbation: Secondary | ICD-10-CM | POA: Insufficient documentation

## 2013-05-01 DIAGNOSIS — G8929 Other chronic pain: Secondary | ICD-10-CM | POA: Insufficient documentation

## 2013-05-01 DIAGNOSIS — Z8679 Personal history of other diseases of the circulatory system: Secondary | ICD-10-CM | POA: Insufficient documentation

## 2013-05-01 DIAGNOSIS — R42 Dizziness and giddiness: Secondary | ICD-10-CM

## 2013-05-01 DIAGNOSIS — J45901 Unspecified asthma with (acute) exacerbation: Secondary | ICD-10-CM

## 2013-05-01 DIAGNOSIS — Z85038 Personal history of other malignant neoplasm of large intestine: Secondary | ICD-10-CM | POA: Insufficient documentation

## 2013-05-01 DIAGNOSIS — R519 Headache, unspecified: Secondary | ICD-10-CM

## 2013-05-01 DIAGNOSIS — Z8719 Personal history of other diseases of the digestive system: Secondary | ICD-10-CM | POA: Insufficient documentation

## 2013-05-01 DIAGNOSIS — IMO0002 Reserved for concepts with insufficient information to code with codable children: Secondary | ICD-10-CM | POA: Insufficient documentation

## 2013-05-01 LAB — BASIC METABOLIC PANEL
BUN: 9 mg/dL (ref 6–23)
CHLORIDE: 105 meq/L (ref 96–112)
CO2: 24 mEq/L (ref 19–32)
CREATININE: 0.66 mg/dL (ref 0.50–1.10)
Calcium: 9.6 mg/dL (ref 8.4–10.5)
GFR calc non Af Amer: >90 mL/min (ref >90)
Glucose, Bld: 96 mg/dL (ref 70–99)
Potassium: 4.2 mEq/L (ref 3.7–5.3)
Sodium: 143 mEq/L (ref 137–147)

## 2013-05-01 LAB — CBC
HEMATOCRIT: 40.6 % (ref 36.0–46.0)
Hemoglobin: 13.6 g/dL (ref 12.0–15.0)
MCH: 29.9 pg (ref 26.0–34.0)
MCHC: 33.5 g/dL (ref 30.0–36.0)
MCV: 89.2 fL (ref 78.0–100.0)
Platelets: 362 10*3/uL (ref 150–400)
RBC: 4.55 MIL/uL (ref 3.87–5.11)
RDW: 12.9 % (ref 11.5–15.5)
WBC: 7.5 10*3/uL (ref 4.0–10.5)

## 2013-05-01 LAB — POCT I-STAT TROPONIN I: Troponin i, poc: 0 ng/mL (ref 0.00–0.08)

## 2013-05-01 LAB — GLUCOSE, CAPILLARY
Glucose-Capillary: 88 mg/dL (ref 70–99)
Glucose-Capillary: 92 mg/dL (ref 70–99)

## 2013-05-01 MED ORDER — KETOROLAC TROMETHAMINE 15 MG/ML IJ SOLN
15.0000 mg | Freq: Once | INTRAMUSCULAR | Status: AC
Start: 2013-05-01 — End: 2013-05-01
  Administered 2013-05-01: 15 mg via INTRAVENOUS
  Filled 2013-05-01: qty 1

## 2013-05-01 MED ORDER — ALBUTEROL SULFATE HFA 108 (90 BASE) MCG/ACT IN AERS
4.0000 | INHALATION_SPRAY | Freq: Once | RESPIRATORY_TRACT | Status: AC
  Administered 2013-05-01: 4 via RESPIRATORY_TRACT
  Filled 2013-05-01: qty 6.7

## 2013-05-01 MED ORDER — METOCLOPRAMIDE HCL 5 MG/ML IJ SOLN
10.0000 mg | Freq: Once | INTRAMUSCULAR | Status: AC
  Administered 2013-05-01: 10 mg via INTRAVENOUS
  Filled 2013-05-01: qty 2

## 2013-05-01 MED ORDER — DIPHENHYDRAMINE HCL 50 MG/ML IJ SOLN
25.0000 mg | Freq: Once | INTRAMUSCULAR | Status: AC
  Administered 2013-05-01: 25 mg via INTRAVENOUS
  Filled 2013-05-01: qty 1

## 2013-05-01 MED ORDER — DEXAMETHASONE SODIUM PHOSPHATE 10 MG/ML IJ SOLN
10.0000 mg | Freq: Once | INTRAMUSCULAR | Status: AC
  Administered 2013-05-01: 10 mg via INTRAVENOUS
  Filled 2013-05-01: qty 1

## 2013-05-01 NOTE — ED Provider Notes (Signed)
Patient seen/examined in the Emergency Department in conjunction with Resident Physician Provider Justin Mend Patient reports headache and dizziness, with symptoms for over 3 weeks Exam : awake/alert, no facial droop, no arm/leg drift Plan: plan is for further testing and likely MRI, if negative will be safe for PCP followup      Sharyon Cable, MD 05/01/13 1939

## 2013-05-01 NOTE — ED Notes (Signed)
Pt states she feels like she may be having a stroke. Symptoms started 3 wks ago. Pt states she feels off balance, unable to find words sometimes, and feels like she is being stabbed in left eye. Pupils equal. Face symmetrical. Grips weak bilaterally but weaker on left. Pt denies trouble eating or drinking. Family notices intermittent slurred speech. Pt ambulatory without difficulty. Denies difficulty with urination

## 2013-05-01 NOTE — ED Notes (Signed)
Pt. Just placed in Room 35, Pt. Placed in a gown and on the monitor.  Plan of care updated with pt.

## 2013-05-01 NOTE — ED Notes (Signed)
Pt back from MRI 

## 2013-05-01 NOTE — ED Provider Notes (Signed)
CSN: [id number]     Arrival date & time 05/01/13  1509 History   First MD Initiated Contact with Patient 05/01/13 1824     Chief Complaint  Patient presents with  . Weakness   HPI Comments: 58 yo F hx of COPD w/ 20 pack/year smoking hx, HLD, Colon CA, IBS, arthritis, presents with CC of headache, ataxia.  Pt states symptoms started 3 weeks prior.  Pt endorses daily headache (dull ache, gradual onset, vertex, frontal, with pain behind bilateral eyes feeling like stabbing), with associated trouble with word finding as reported to her by her son, along with some lightheadedness, ataxia with some paresthesias in BLE at times.  All of these latter symptoms are when pt has headache.  Pt also c/o of chronic joint pain 2/2 arthritis which is stable. She denies fever, chills, vision changes, CP, SOB, abdominal pain, n/v/d, rash, myalgias, focal deficit, or any other symptoms.  Pt has taken aleve daily for HA with some relief.  Denies prior hx of CVA/TIA, but states her mother had a stroke.  Pt denies hx of migraines, but states gets occasional headaches which are often relieved by OTC medication.  The history is provided by the patient. No language interpreter was used.    Past Medical History  Diagnosis Date  . Chronic pain   . Asthma   . Cancer   . Colon cancer   . COPD (chronic obstructive pulmonary disease)   . IBS (irritable bowel syndrome)   . Chronic back pain   . Chronic neck pain   . Chronic abdominal pain   . Hemorrhoids    Past Surgical History  Procedure Laterality Date  . Appendectomy    . Colon resection    . Abdominal hysterectomy    . Breast lumpectomy    . Colonoscopy  07/17/09    [id number] internal hemorrhoids/tortuous colon/3-mm sessile hepatic polyp/sigmoid colon diverticula, small internal hemorrhoids, path: tubular adenoma  . Colonoscopy N/A 07/23/2012    [id number] polyp in the ascending colon/Two sessile polyps in the rectum/RECTAL BLEEDING DUE TO Large internal  hemorrhoids-S/P BANDINGx3   Family History  Problem Relation Age of Onset  . Colon cancer Neg Hx    History  Substance Use Topics  . Smoking status: Former Research scientist (life sciences)  . Smokeless tobacco: Not on file  . Alcohol Use: No   OB History   Grav Para Term Preterm Abortions TAB SAB Ect Mult Living                 Review of Systems  Constitutional: Negative for fever and chills.  Respiratory: Negative for cough and shortness of breath.   Cardiovascular: Negative for chest pain.  Gastrointestinal: Negative for nausea, vomiting, abdominal pain and diarrhea.  Genitourinary: Negative for dysuria and difficulty urinating.  Musculoskeletal: Positive for arthralgias. Negative for myalgias.  Skin: Negative for rash.  Neurological: Positive for speech difficulty, light-headedness, numbness and headaches. Negative for dizziness and weakness.  Hematological: Negative for adenopathy. Does not bruise/bleed easily.  All other systems reviewed and are negative.      Allergies  Sulfa antibiotics  Home Medications   Current Outpatient Rx  Name  Route  Sig  Dispense  Refill  . albuterol (PROVENTIL HFA;VENTOLIN HFA) 108 (90 BASE) MCG/ACT inhaler   Inhalation   Inhale 2 puffs into the lungs every 6 (six) hours as needed. Shortness of breath         . benzonatate (TESSALON) 100 MG capsule   Oral   Take  100 mg by mouth 3 (three) times daily as needed. cough         . cyclobenzaprine (FLEXERIL) 10 MG tablet   Oral   Take 10 mg by mouth 3 (three) times daily as needed. Muscle spasms         . fluticasone (VERAMYST) 27.5 MCG/SPRAY nasal spray   Nasal   Place 2 sprays into the nose daily as needed for allergies.          Marland Kitchen levETIRAcetam (KEPPRA) 500 MG tablet   Oral   Take 500 mg by mouth daily.         Marland Kitchen loratadine (CLARITIN) 10 MG tablet   Oral   Take 10 mg by mouth daily as needed for allergies. allergies         . lubiprostone (AMITIZA) 8 MCG capsule   Oral   Take 1  capsule (8 mcg total) by mouth 2 (two) times daily with a meal.   60 capsule   11   . pantoprazole (PROTONIX) 40 MG tablet   Oral   Take 40 mg by mouth daily.         Marland Kitchen tiotropium (SPIRIVA) 18 MCG inhalation capsule   Inhalation   Place 18 mcg into inhaler and inhale daily.         . Fluticasone-Salmeterol (ADVAIR) 250-50 MCG/DOSE AEPB   Inhalation   Inhale 1 puff into the lungs every 12 (twelve) hours.          BP 115/66  Pulse 61  Temp(Src) 98.1 F (36.7 C) (Oral)  Resp 18  Wt 180 lb (81.647 kg)  SpO2 98% Physical Exam  Nursing note and vitals reviewed. Constitutional: She is oriented to person, place, and time. She appears well-developed and well-nourished.  HENT:  Head: Normocephalic and atraumatic.  Right Ear: External ear normal.  Left Ear: External ear normal.  Mouth/Throat: Oropharynx is clear and moist.  Eyes: Conjunctivae and EOM are normal. Pupils are equal, round, and reactive to light.  EOM intact.  Fundoscopic exam limited in ED, but no obvious papilledema, or any other obvious abnormalities.  Neck: Normal range of motion. Neck supple. No JVD present. No tracheal deviation present. No thyromegaly present.  Cardiovascular: Normal rate, regular rhythm, normal heart sounds and intact distal pulses.   Pulmonary/Chest: Effort normal. No stridor. No respiratory distress. She has wheezes. She has no rales. She exhibits no tenderness.  Mild wheezes bilaterally.  Abdominal: Soft. Bowel sounds are normal. She exhibits no distension and no mass. There is no tenderness. There is no rebound and no guarding.  Musculoskeletal: Normal range of motion.  Lymphadenopathy:    She has no cervical adenopathy.  Neurological: She is alert and oriented to person, place, and time.  Pt is alert, oriented X 3.  Pt's vision intact, with equal hearing bilaterally.  EOM intact.  No tongue deviation.  Bilateral shoulder shrug equal, 5/5 strength.  No sensory deficit of face, BUE, BLE.   Pt with 5/5 strength LUE, 4/5 RUE limited by pain, 5/5 RLE, 4/5 LLE limited by pain.  Pt ambulated in ED without difficulty, no ataxia.  No dysmetria, or dysdiadocokinesis.  Skin: Skin is warm and dry.    ED Course  Procedures (including critical care time) Labs Review Labs Reviewed  BASIC METABOLIC PANEL  CBC  GLUCOSE, CAPILLARY  GLUCOSE, CAPILLARY  POCT I-STAT TROPONIN I   Imaging Review Ct Head (brain) Wo Contrast  05/01/2013   CLINICAL DATA:  Weakness  EXAM: CT  HEAD WITHOUT CONTRAST  TECHNIQUE: Contiguous axial images were obtained from the base of the skull through the vertex without intravenous contrast.  COMPARISON:  None.  FINDINGS: No skull fracture is noted. Paranasal sinuses and mastoid air cells are unremarkable. No intracranial hemorrhage, mass effect or midline shift. The gray and white-matter differentiation is preserved. No definite acute cortical infarction. No mass lesion is noted on this unenhanced scan. No intra or extra-axial fluid collection.  IMPRESSION: No acute intracranial abnormality.   Electronically Signed   By: Lahoma Crocker M.D.   On: 05/01/2013 16:31   Mr Brain Wo Contrast  05/01/2013   CLINICAL DATA:  Imbalance and left eye pain. Weakness. Intermittent speech difficulty.  EXAM: MRI HEAD WITHOUT CONTRAST  TECHNIQUE: Multiplanar, multiecho pulse sequences of the brain and surrounding structures were obtained without intravenous contrast.  COMPARISON:  CT HEAD W/O CM dated 05/01/2013  FINDINGS: No evidence for acute infarction, hemorrhage, mass lesion, hydrocephalus, or extra-axial fluid. Normal cerebral volume. Mild subcortical and periventricular T2 and FLAIR hyperintensities, likely chronic microvascular ischemic change. Pituitary, pineal, and cerebellar tonsils unremarkable. No upper cervical lesions. Flow voids are maintained throughout the carotid, basilar, and vertebral arteries. There are no areas of chronic hemorrhage. Visualized calvarium, skull base, and upper  cervical osseous structures unremarkable. Scalp and extracranial soft tissues, orbits, sinuses, and mastoids show no acute process. Similar appearance to prior CT.  IMPRESSION: Mild subcortical and periventricular T2 and FLAIR hyperintensities, likely chronic microvascular ischemic change. No acute intracranial findings.   Electronically Signed   By: Rolla Flatten M.D.   On: 05/01/2013 21:39    EKG Interpretation    Date/Time:  Wednesday May 01 2013 15:23:34 EST Ventricular Rate:  80 PR Interval:  116 QRS Duration: 84 QT Interval:  362 QTC Calculation: 417 R Axis:   73 Text Interpretation:  Sinus rhythm with marked sinus arrhythmia Otherwise normal ECG No significant change since last tracing Confirmed by Downey [phone]) on 05/01/2013 5:45:31 PM            MDM   Final diagnoses:  None   58 yo F hx of COPD w/ 20 pack/year smoking hx, HLD, Colon CA, IBS, arthritis, presents with CC of headache, ataxia.  Filed Vitals:   05/01/13 2230  BP: 116/79  Pulse: 65  Temp:   Resp: 18   Physical exam as above.  EKG as above, NSR with sinus arrhythmia.  Neurologic exam as extensively detailed above.  No deficits noted.  Pt ambulated in room, without ataxia, but did have a limp on the left, 2/2 arthritis of hip.  Pt with continued headache.  DDx TIA/CVA vs atypical headache vs rebound headache 2/2 daily NSAID use.  Unlikely SAH given three weeks of symptoms, no focal deficits, and gradual onset headache which abates daily.  CT head shows no acute abnormality.  MRI ordered to r/o CVA, which demonstrated chronic microvascular changes, but no acute intracranial findings. CBC, BMP are WNL.  Troponin 0.00.  Unlikely TIA/CVA given normal CT, and MRI after 3 weeks of symptoms.  Possible atypical migraine.  Pt given HA cocktail consisting of reglan, benadryl, decadron, toradol, with improvement in symptoms.  Pt also given albuterol for some wheezing, related to her COPD.    Pt to be  d/c home in good condition.  Encouraged to continue supportive care.  F/u with PCP in 1 week.  Strict return precautions given.  Pt understands and agrees with plan.  I have discussed pt's care  plan with Dr. Christy Gentles.  Sinda Du, MD    Sinda Du, MD 05/02/13 [phone]

## 2013-05-01 NOTE — Discharge Instructions (Signed)
Dizziness ° Dizziness means you feel unsteady or lightheaded. You might feel like you are going to pass out (faint). °HOME CARE  °· Drink enough fluids to keep your pee (urine) clear or pale yellow. °· Take your medicines exactly as told by your doctor. If you take blood pressure medicine, always stand up slowly from the lying or sitting position. Hold on to something to steady yourself. °· If you need to stand in one place for a long time, move your legs often. Tighten and relax your leg muscles. °· Have someone stay with you until you feel okay. °· Do not drive or use heavy machinery if you feel dizzy. °· Do not drink alcohol. °GET HELP RIGHT AWAY IF:  °· You feel dizzy or lightheaded and it gets worse. °· You feel sick to your stomach (nauseous), or you throw up (vomit). °· You have trouble talking or walking. °· You feel weak or have trouble using your arms, hands, or legs. °· You cannot think clearly or have trouble forming sentences. °· You have chest pain, belly (abdominal) pain, sweating, or you are short of breath. °· Your vision changes. °· You are bleeding. °· You have problems from your medicine that seem to be getting worse. °MAKE SURE YOU:  °· Understand these instructions. °· Will watch your condition. °· Will get help right away if you are not doing well or get worse. °Document Released: 02/17/2011 Document Revised: 05/23/2011 Document Reviewed: 02/17/2011 °[employer]® Patient Information ©2014 [employer]. ° °

## 2013-05-01 NOTE — ED Notes (Signed)
Patient transported to MRI 

## 2013-05-03 NOTE — ED Provider Notes (Signed)
I have personally seen and examined the patient.  I have discussed the plan of care with the resident.  I have reviewed the documentation on PMH/FH/Soc. History.  I have reviewed the documentation of the resident and agree.  Pt without focal neuro deficits on my evaluation She is well appearing Suspicion for Acute CVA/TIA is low at this time, given history/exam  I advised f/u with PCP within a week   Sharyon Cable, MD 05/03/13 [phone]

## 2014-05-07 ENCOUNTER — Ambulatory Visit: Payer: Medicare Other | Admitting: Nurse Practitioner

## 2014-05-09 ENCOUNTER — Ambulatory Visit (INDEPENDENT_AMBULATORY_CARE_PROVIDER_SITE_OTHER): Payer: Medicare Other | Admitting: Nurse Practitioner

## 2014-05-09 ENCOUNTER — Encounter: Payer: Self-pay | Admitting: Nurse Practitioner

## 2014-05-09 VITALS — BP 108/62 | HR 60 | Temp 97.3°F | Ht 64.0 in | Wt 172.6 lb

## 2014-05-09 DIAGNOSIS — R197 Diarrhea, unspecified: Secondary | ICD-10-CM

## 2014-05-09 DIAGNOSIS — R1030 Lower abdominal pain, unspecified: Secondary | ICD-10-CM

## 2014-05-09 MED ORDER — HYDROCORTISONE 1 % RE CREA: 1.0000 | TOPICAL_CREAM | Freq: Two times a day (BID) | RECTAL | Status: DC

## 2014-05-09 NOTE — Progress Notes (Signed)
Referring Provider: Celedonio Savage, MD Primary Care Physician:  Celedonio Savage, MD Primary GI: Dr. Oneida Alar  Chief Complaint  Patient presents with  . Diarrhea    Stopped yesterday after 10 days    HPI:   59 year old female presents for worsening abdominal pain, diarrhea, and nausea. Has a history of IBS, chronic abdominal pain, colon cancer s/p partial colectomy. Review of past records shows last colonoscopy 07/23/12 showing single 6 mm polyp in the ascending colon, two sessile polyps in the rectum between 3-7 mm, and rectal bleeding due to large internal hemorrhoids s/p banding x 3, normal anastomosis. AC polypectomy with some residucal bleeding and subsequently clipped.  Today she states she has had a decrease in appetite and diarrhea. Appetite issues started 2 months ago. Diarrhea started 10 days ago. Went to the ER 05/01/14 and was given medication which helped resolve her diarrhea, which has resolved as of yesterday. Also complains of abdominal pain which started about the 3rd to 4rd day after diarrhea started. Pain is in the lower abdomen mostly and described as crampy with some radiation to her epigastric area. Pain improved with a bowel movement. Nothing she can think of makes her symptoms worse. Was given Bentyl and Zofran in the ER. Diarrhea has resolved, abdominal pain is improving but not resolved. Denies the following associated symptoms: fever, vomiting, chest pain, shortness of breath. Admits some dizziness which she attributes to being weak from decreased appetite and some chills, which have resolved. Her appetite seems to be slowly returning now that diarrhea resolved and abdominal pain improving. Typically has a bowel movement every 2-3 weeks before this episode, has been taken off Amitiza due to diarrhea. Denies any other upper or lower GI symptoms.  Past Medical History  Diagnosis Date  . Chronic pain   . Asthma   . Cancer   . Colon cancer   . COPD (chronic obstructive pulmonary  disease)   . IBS (irritable bowel syndrome)   . Chronic back pain   . Chronic neck pain   . Chronic abdominal pain   . Hemorrhoids   . Migraines     Past Surgical History  Procedure Laterality Date  . Appendectomy    . Colon resection    . Abdominal hysterectomy    . Breast lumpectomy    . Colonoscopy  07/17/09    HEN:IDPOE internal hemorrhoids/tortuous colon/3-mm sessile hepatic polyp/sigmoid colon diverticula, small internal hemorrhoids, path: tubular adenoma  . Colonoscopy N/A 07/23/2012    UMP:NTIRWE polyp in the ascending colon/Two sessile polyps in the rectum/RECTAL BLEEDING DUE TO Large internal hemorrhoids-S/P BANDINGx3    Current Outpatient Prescriptions  Medication Sig Dispense Refill  . acetaminophen (TYLENOL) 325 MG tablet Take 325 mg by mouth every 6 (six) hours as needed.    Marland Kitchen albuterol (PROVENTIL HFA;VENTOLIN HFA) 108 (90 BASE) MCG/ACT inhaler Inhale 2 puffs into the lungs every 6 (six) hours as needed. Shortness of breath    . benzonatate (TESSALON) 100 MG capsule Take 100 mg by mouth 3 (three) times daily as needed. cough    . cyclobenzaprine (FLEXERIL) 10 MG tablet Take 10 mg by mouth 3 (three) times daily as needed. Muscle spasms    . dicyclomine (BENTYL) 20 MG tablet Take 20 mg by mouth 4 (four) times daily -  before meals and at bedtime.    . fluticasone (VERAMYST) 27.5 MCG/SPRAY nasal spray Place 2 sprays into the nose daily as needed for allergies.     . Fluticasone-Salmeterol (  ADVAIR) 250-50 MCG/DOSE AEPB Inhale 1 puff into the lungs every 12 (twelve) hours.    Marland Kitchen HYDROcodone-acetaminophen (NORCO/VICODIN) 5-325 MG per tablet Take 1 tablet by mouth every 6 (six) hours as needed for moderate pain.    Marland Kitchen levETIRAcetam (KEPPRA) 500 MG tablet Take 500 mg by mouth daily.    Marland Kitchen loratadine (CLARITIN) 10 MG tablet Take 10 mg by mouth daily as needed for allergies. allergies    . ondansetron (ZOFRAN) 4 MG tablet Take 4 mg by mouth every 8 (eight) hours as needed for nausea or  vomiting.    . pantoprazole (PROTONIX) 40 MG tablet Take 40 mg by mouth daily.    Marland Kitchen tiotropium (SPIRIVA) 18 MCG inhalation capsule Place 18 mcg into inhaler and inhale daily.     No current facility-administered medications for this visit.    Allergies as of 05/09/2014 - Review Complete 05/09/2014  Allergen Reaction Noted  . Sulfa antibiotics Hives and Itching 07/11/2012    Family History  Problem Relation Age of Onset  . Colon cancer Neg Hx   . Hypertension Mother   . CVA Mother   . Parkinson's disease Mother   . Emphysema Father     History   Social History  . Marital Status: Legally Separated    Spouse Name: N/A  . Number of Children: N/A  . Years of Education: N/A   Social History Main Topics  . Smoking status: Former Smoker    Quit date: 05/10/2007  . Smokeless tobacco: Former Systems developer    Quit date: 05/09/1974     Comment: Quit x 7 years  . Alcohol Use: No  . Drug Use: No  . Sexual Activity: Not on file   Other Topics Concern  . None   Social History Narrative    Review of Systems: Gen: Denies fever, anorexia. Admits chills, some weakness, weight loss.  CV: Denies chest pain, palpitations, syncope, peripheral edema, and claudication. Resp: Denies dyspnea at rest, wheezing. GI: See HPI. Denies vomiting blood, jaundice.   Denies dysphagia or odynophagia. Derm: Denies rash, itching, dry skin Psych: Denies depression, anxiety, memory loss, confusion. Heme: Denies bruising, bleeding, and enlarged lymph nodes.  Physical Exam: BP 108/62 mmHg  Pulse 60  Temp(Src) 97.3 F (36.3 C) (Oral)  Ht 5\' 4"  (1.626 m)  Wt 172 lb 9.6 oz (78.291 kg)  BMI 29.61 kg/m2 General:   Alert and oriented. No distress noted. Pleasant and cooperative.  Head:  Normocephalic and atraumatic. Eyes:  Conjuctiva clear without scleral icterus. Mouth:  Oral mucosa pink and moist. Good dentition. No lesions. Neck:  Supple, without mass or thyromegaly. Lungs:  Clear to auscultation  bilaterally. No wheezes, rales, or rhonchi. No distress.  Heart:  S1, S2 present without murmurs, rubs, or gallops. Regular rate and rhythm. Abdomen:  +BS, soft, non-tender and non-distended. No rebound or guarding. No HSM or masses noted. Msk:  Symmetrical without gross deformities. Normal posture. Pulses:  2+ DP noted bilaterally Extremities:  Without edema. Neurologic:  Alert and  oriented x4;  grossly normal neurologically. Skin:  Intact without significant lesions or rashes. Cervical Nodes:  No significant cervical adenopathy. Psych:  Alert and cooperative. Normal mood and affect.    05/09/2014 11:25 AM

## 2014-05-09 NOTE — Assessment & Plan Note (Addendum)
Diarrhea associated with abdominal pain x 10 days, seems to have tapered off and stopped yesterday. Will order stool studies to use if she has an other episode. CBC in the ER wnl. Likely transient diarrhea non-infectious. Last colonoscopy 2014 with polypectomy reccommended follow-up in 3-5 years.

## 2014-05-09 NOTE — Patient Instructions (Signed)
1. We will provide you sample cups and if you have more diarrhea, put some in the cups and take them to the lab so we can check for any infection 2. If diarrhea has stopped you should be able to stop the Bentyl as it can promote constipation which you typically have a problem with 3. Follow-up in a month to reassess your symptoms and see if we need to try another medicine for chronic constipation.

## 2014-05-09 NOTE — Assessment & Plan Note (Addendum)
Worsening abdominal pain associated with diarrhea, pain relieved with bowel movment. Seen in the ER and given Bentyl which has helped. No stool studies done. Will give cups for stool studies if she has another episode of diarrhea. Has stopped Amitiza due o diarrhea side affects (previous to this episode). May need to consider other agent for likely IBS. No red flag/warning signs. Follow-up in 4 weeks to reassess or sooner if symptoms return/worsen/persist.

## 2014-05-12 ENCOUNTER — Other Ambulatory Visit: Payer: Self-pay | Admitting: Nurse Practitioner

## 2014-05-13 LAB — GIARDIA ANTIGEN: Giardia Screen (EIA): NEGATIVE

## 2014-05-13 LAB — C. DIFFICILE GDH AND TOXIN A/B
C. difficile GDH: NOT DETECTED
C. difficile Toxin A/B: NOT DETECTED

## 2014-05-13 NOTE — Progress Notes (Signed)
CC'ED TO PCP 

## 2014-05-16 LAB — STOOL CULTURE

## 2014-05-22 ENCOUNTER — Ambulatory Visit: Payer: Medicare Other | Admitting: Nurse Practitioner

## 2014-06-09 ENCOUNTER — Encounter: Payer: Self-pay | Admitting: Gastroenterology

## 2014-06-09 ENCOUNTER — Encounter: Payer: Self-pay | Admitting: Nurse Practitioner

## 2014-06-09 ENCOUNTER — Ambulatory Visit: Payer: Medicare Other | Admitting: Nurse Practitioner

## 2014-06-09 ENCOUNTER — Telehealth: Payer: Self-pay | Admitting: Gastroenterology

## 2014-06-09 NOTE — Telephone Encounter (Signed)
PATIENT WAS A NO SHOW AND LETTER SENT  °

## 2014-06-09 NOTE — Telephone Encounter (Signed)
Noted  

## 2014-08-11 ENCOUNTER — Encounter (HOSPITAL_COMMUNITY): Payer: Self-pay | Admitting: Emergency Medicine

## 2014-08-11 ENCOUNTER — Emergency Department (HOSPITAL_COMMUNITY)
Admission: EM | Admit: 2014-08-11 | Discharge: 2014-08-11 | Disposition: A | Discharge status: Home / Self Care | Payer: Medicare Other | Attending: Emergency Medicine | Admitting: Emergency Medicine

## 2014-08-11 DIAGNOSIS — G8929 Other chronic pain: Secondary | ICD-10-CM | POA: Diagnosis not present

## 2014-08-11 DIAGNOSIS — Z87891 Personal history of nicotine dependence: Secondary | ICD-10-CM | POA: Diagnosis not present

## 2014-08-11 DIAGNOSIS — R21 Rash and other nonspecific skin eruption: Secondary | ICD-10-CM | POA: Diagnosis present

## 2014-08-11 DIAGNOSIS — Z85038 Personal history of other malignant neoplasm of large intestine: Secondary | ICD-10-CM | POA: Insufficient documentation

## 2014-08-11 DIAGNOSIS — G43909 Migraine, unspecified, not intractable, without status migrainosus: Secondary | ICD-10-CM | POA: Insufficient documentation

## 2014-08-11 DIAGNOSIS — Z7951 Long term (current) use of inhaled steroids: Secondary | ICD-10-CM | POA: Insufficient documentation

## 2014-08-11 DIAGNOSIS — J449 Chronic obstructive pulmonary disease, unspecified: Secondary | ICD-10-CM | POA: Insufficient documentation

## 2014-08-11 DIAGNOSIS — Z8719 Personal history of other diseases of the digestive system: Secondary | ICD-10-CM | POA: Diagnosis not present

## 2014-08-11 DIAGNOSIS — Z79899 Other long term (current) drug therapy: Secondary | ICD-10-CM | POA: Diagnosis not present

## 2014-08-11 DIAGNOSIS — L239 Allergic contact dermatitis, unspecified cause: Secondary | ICD-10-CM | POA: Diagnosis not present

## 2014-08-11 MED ORDER — METHYLPREDNISOLONE SODIUM SUCC 125 MG IJ SOLR
125.0000 mg | Freq: Once | INTRAMUSCULAR | Status: AC
  Administered 2014-08-11: 125 mg via INTRAMUSCULAR

## 2014-08-11 MED ORDER — PREDNISONE 10 MG PO TABS: 20.0000 mg | ORAL_TABLET | Freq: Two times a day (BID) | ORAL | Status: DC

## 2014-08-11 MED ORDER — METHYLPREDNISOLONE SODIUM SUCC 40 MG IJ SOLR
125.0000 mg | Freq: Once | INTRAMUSCULAR | Status: DC
  Filled 2014-08-11: qty 4

## 2014-08-11 NOTE — ED Provider Notes (Signed)
CSN: [id number]     Arrival date & time 08/11/14  1709 History  This chart was scribed for non-physician practitioner, [facility] M. Janit Bern, NP working with Milton Ferguson, MD by Hansel Feinstein, ED scribe. This patient was seen in room APFT23/APFT23 and the patient's care was started at 5:30 PM    Chief Complaint  Patient presents with  . Rash   Patient is a 59 y.o. female presenting with rash. The history is provided by the patient. No language interpreter was used.  Rash Location:  Shoulder/arm and torso Shoulder/arm rash location:  L forearm and R forearm Torso rash location:  L chest, R chest, upper back and lower back Quality: itchiness, painful, redness and swelling   Pain details:    Quality:  Itching and stinging   Onset quality:  Gradual   Timing:  Constant   Progression:  Unchanged Severity:  Moderate Context: insect bite/sting   Relieved by:  Nothing Worsened by:  Nothing tried Ineffective treatments:  Anti-itch cream, antibiotic cream and topical steroids Associated symptoms: headaches, joint pain, myalgias and shortness of breath   Associated symptoms: not wheezing     HPI Comments: Paula Carrillo is a 59 y.o. female who presents to the Emergency Department complaining of gradually worsening, moderate rash on bilateral forearm, abdomen and back onset one month ago. She reports stinging pain, constant HA onset 3 weeks ago, dizziness, muscle aching, swelling and itching as associated Sx. She reports that she was working in her garden but is not aware of any poison oak exposure. She states that she was bitten by a tick in the beginning of the month. Pt reports that she was seen at [facility] on March 9 for similar rash on the upper extremities, which was diagnosed as cellulitis and was treated successfully with Doxycycline. Pt was also evaluated by her PCP and was given Prednisone and Bactroban with no relief. Pt has also been seen in [place] for the same Sx and was referred to  a dermatologist. She made an appointment in [place] but is not going to be seen until October. She states that she has also tried Benadryl and warm salty water soaks with no relief. Pt reports baseline SOB that is treated at home with nebulizers. She denies cough and wheezing as associated Sx.   Past Medical History  Diagnosis Date  . Chronic pain   . Asthma   . Cancer   . Colon cancer   . COPD (chronic obstructive pulmonary disease)   . IBS (irritable bowel syndrome)   . Chronic back pain   . Chronic neck pain   . Chronic abdominal pain   . Hemorrhoids   . Migraines    Past Surgical History  Procedure Laterality Date  . Appendectomy    . Colon resection    . Abdominal hysterectomy    . Breast lumpectomy    . Colonoscopy  07/17/09    TMH:[id number] internal hemorrhoids/tortuous colon/3-mm sessile hepatic polyp/sigmoid colon diverticula, small internal hemorrhoids, path: tubular adenoma  . Colonoscopy N/A 07/23/2012    [id number]:[id number] polyp in the ascending colon/Two sessile polyps in the rectum/RECTAL BLEEDING DUE TO Large internal hemorrhoids-S/P BANDINGx3   Family History  Problem Relation Age of Onset  . Colon cancer Neg Hx   . Hypertension Mother   . CVA Mother   . Parkinson's disease Mother   . Emphysema Father    History  Substance Use Topics  . Smoking status: Former Smoker    Quit date:  05/10/2007  . Smokeless tobacco: Former Systems developer    Quit date: 05/09/1974     Comment: Quit x 7 years  . Alcohol Use: No   OB History    No data available     Review of Systems  Respiratory: Positive for shortness of breath. Negative for cough and wheezing.   Musculoskeletal: Positive for myalgias and arthralgias.  Skin: Positive for rash.  Neurological: Positive for dizziness and headaches.  All other systems reviewed and are negative.  Allergies  Sulfa antibiotics  Home Medications   Prior to Admission medications   Medication Sig Start Date End Date Taking? Authorizing Provider   acetaminophen (TYLENOL) 325 MG tablet Take 325 mg by mouth every 6 (six) hours as needed.    Historical Provider, MD  albuterol (PROVENTIL HFA;VENTOLIN HFA) 108 (90 BASE) MCG/ACT inhaler Inhale 2 puffs into the lungs every 6 (six) hours as needed. Shortness of breath    Historical Provider, MD  benzonatate (TESSALON) 100 MG capsule Take 100 mg by mouth 3 (three) times daily as needed. cough    Historical Provider, MD  cyclobenzaprine (FLEXERIL) 10 MG tablet Take 10 mg by mouth 3 (three) times daily as needed. Muscle spasms    Historical Provider, MD  dicyclomine (BENTYL) 20 MG tablet Take 20 mg by mouth 4 (four) times daily -  before meals and at bedtime.    Historical Provider, MD  fluticasone (VERAMYST) 27.5 MCG/SPRAY nasal spray Place 2 sprays into the nose daily as needed for allergies.     Historical Provider, MD  Fluticasone-Salmeterol (ADVAIR) 250-50 MCG/DOSE AEPB Inhale 1 puff into the lungs every 12 (twelve) hours.    Historical Provider, MD  HYDROcodone-acetaminophen (NORCO/VICODIN) 5-325 MG per tablet Take 1 tablet by mouth every 6 (six) hours as needed for moderate pain.    Historical Provider, MD  hydrocortisone (PROCTOCORT) 1 % CREA Apply 1 Dose topically 2 (two) times daily. For up to 7 days. 05/09/14   Carlis Stable, NP  levETIRAcetam (KEPPRA) 500 MG tablet Take 500 mg by mouth daily.    Historical Provider, MD  loratadine (CLARITIN) 10 MG tablet Take 10 mg by mouth daily as needed for allergies. allergies    Historical Provider, MD  ondansetron (ZOFRAN) 4 MG tablet Take 4 mg by mouth every 8 (eight) hours as needed for nausea or vomiting.    Historical Provider, MD  pantoprazole (PROTONIX) 40 MG tablet Take 40 mg by mouth daily.    Historical Provider, MD  predniSONE (DELTASONE) 10 MG tablet Take 2 tablets (20 mg total) by mouth 2 (two) times daily with a meal. 08/11/14   Hope Bunnie Pion, NP  tiotropium (SPIRIVA) 18 MCG inhalation capsule Place 18 mcg into inhaler and inhale daily.     Historical Provider, MD   BP 140/84 mmHg  Pulse 71  Temp(Src) 98.2 F (36.8 C) (Oral)  Resp 18  Ht 5\' 2"  (1.575 m)  Wt 170 lb (77.111 kg)  BMI 31.09 kg/m2  SpO2 100% Physical Exam  Constitutional: She is oriented to person, place, and time. She appears well-developed and well-nourished. No distress.  HENT:  Head: Normocephalic and atraumatic.  Eyes: Conjunctivae and EOM are normal.  Neck: Neck supple.  Cardiovascular: Normal rate.   Pulmonary/Chest: Breath sounds normal. No respiratory distress.  Musculoskeletal: Normal range of motion.  Neurological: She is alert and oriented to person, place, and time.  Skin: Skin is warm and dry.  Red rash covering upper abdomen, area to the left upper  and lower arm on the palmar aspect and to the right upper arm. Few areas to the back. Severe itching.   Psychiatric: Her behavior is normal.  Nursing note and vitals reviewed.   ED Course  Procedures (including critical care time) DIAGNOSTIC STUDIES: Oxygen Saturation is 100% on RA, normal by my interpretation.    COORDINATION OF CARE: 5:40 PM Discussed treatment plan with pt at bedside which includes a referral to dermatologist Dr. Nevada Crane in [place] and pt agreed to plan.   MDM  59 y.o. female with recurrent skin rash off and on for the past 6 weeks. Discussed with the patient plan of care and all questioned fully answered. Dr. Roderic Palau in to examine the patient and will give Solumedrol 125 mg. IM now and prednisone to go home with.  She will return if any problems arise.   Final diagnoses:  Allergic dermatitis   I personally performed the services described in this documentation, which was scribed in my presence. The recorded information has been reviewed and is accurate.    [address] [place], [place] 08/11/14 1884  Milton Ferguson, MD 08/14/14 1134

## 2014-08-11 NOTE — Discharge Instructions (Signed)
Call Dr. Juel Burrow office for an appointment with the [facility] office.

## 2014-08-11 NOTE — ED Notes (Signed)
Pt states that she has had a burning, itching rash for a few weeks now.  Was seen at [facility] but wanted another opinion.

## 2015-03-31 ENCOUNTER — Ambulatory Visit (INDEPENDENT_AMBULATORY_CARE_PROVIDER_SITE_OTHER): Payer: Medicare Other | Admitting: Nurse Practitioner

## 2015-03-31 ENCOUNTER — Encounter: Payer: Self-pay | Admitting: Nurse Practitioner

## 2015-03-31 VITALS — BP 118/75 | HR 62 | Temp 98.2°F | Ht 62.0 in | Wt 165.6 lb

## 2015-03-31 DIAGNOSIS — K649 Unspecified hemorrhoids: Secondary | ICD-10-CM | POA: Diagnosis not present

## 2015-03-31 DIAGNOSIS — R1030 Lower abdominal pain, unspecified: Secondary | ICD-10-CM

## 2015-03-31 DIAGNOSIS — R197 Diarrhea, unspecified: Secondary | ICD-10-CM | POA: Diagnosis not present

## 2015-03-31 NOTE — Assessment & Plan Note (Signed)
Issue with lower abdominal pain which seems to be more suprapubic and lower abdomen. She is also having dysuria and hematuria in the past several days as well as possible CVA tenderness on exam although it is tender to just light palpation patient as well and could be musculoskeletal in nature. Regardless, I have advised her to call her primary care and asked him to test her urine. This seems very suspicious for urinary tract infection. We will manage her diarrhea as noted below. Return for follow-up in 2 months to follow-up on her symptoms and schedule her surveillance colonoscopy which will be due in May of this year.

## 2015-03-31 NOTE — Assessment & Plan Note (Signed)
Patient with a 1-2 day onset of diarrhea. She has a history of irritable bowel and tends to have bouts of diarrhea. Bentyl seems to work well for her. I have advised her to start taking Bentyl again until her symptoms resolve. She is not having taken the past couple months because her bowel movements have been normal. Return for follow-up in 2 months to check on her symptom progression and schedule her for her surveillance colonoscopy which will be due around the middle of this year.

## 2015-03-31 NOTE — Assessment & Plan Note (Signed)
Patient with a history of hemorrhoids status post banding. Hemorrhoids flareup and her painful when she is having diarrhea. Recommend she continue Anusol cream this is helped significantly. Return for follow-up in 2 months for further evaluation.

## 2015-03-31 NOTE — Progress Notes (Signed)
Referring Provider: Celedonio Savage, MD Primary Care Physician:  Celedonio Savage, MD Primary GI:  Dr. Oneida Alar  Chief Complaint  Patient presents with  . Abdominal Pain  . Blood In Stools    HPI:   60 year old female presents for lower abdominal pain. She has a history of colon cancer, hemorrhoids, chronic abdominal pain, and irritable bowel syndrome. Last seen in our office on 05/09/2014 for diarrhea and lower abdominal pain. At that time she had been seen in the emergency room and given Bentyl which resolved her diarrhea and improved her abdominal pain but did not resolve it. Before her episode of diarrhea she was typically constipated and on Amitiza which was discontinued due to her diarrhea. She was scheduled for 4 week follow-up and was a no-show for that appointment. Stool studies done at the time of her last visit were negative. Last colonoscopy on 07/23/2012 found a single ascending colon polyp, 2 sessile polyps in the rectum, rectal bleeding due to large internal hemorrhoids status post banding 3. Polyps were tubular adenoma on surgical pathology. Recommended next colonoscopy in 3-5 years.  Today she states she began having abdominal pain in the lower abdomen and flanks, pain started 3 days ago. Described as cramping. When she urinated there was blood and dysuria. Last night began with runny stool with associated hemorrhoid burning and some toilet tissue hematochezia. Has not taken bentyl in 1-2 months because she's recently had normal stools until this acute change. Denies fever. Has had 20 pound intentional weight loss through dietary changes. Has had some frequent UTIs recently per her. Was told she has "two spots" in each kidney. Denies chest pain, worsening dyspnea, dizziness, lightheadedness, syncope, near syncope. Denies any other upper or lower GI symptoms.  Past Medical History  Diagnosis Date  . Chronic pain   . Asthma   . Cancer ([place])   . Colon cancer ([place])   . COPD (chronic  obstructive pulmonary disease) ([place])   . IBS (irritable bowel syndrome)   . Chronic back pain   . Chronic neck pain   . Chronic abdominal pain   . Hemorrhoids   . Migraines     Past Surgical History  Procedure Laterality Date  . Appendectomy    . Colon resection    . Abdominal hysterectomy    . Breast lumpectomy    . Colonoscopy  07/17/09    [id number] internal hemorrhoids/tortuous colon/3-mm sessile hepatic polyp/sigmoid colon diverticula, small internal hemorrhoids, path: tubular adenoma  . Colonoscopy N/A 07/23/2012    [id number] polyp in the ascending colon/Two sessile polyps in the rectum/RECTAL BLEEDING DUE TO Large internal hemorrhoids-S/P BANDINGx3    Current Outpatient Prescriptions  Medication Sig Dispense Refill  . acetaminophen (TYLENOL) 325 MG tablet Take 325 mg by mouth every 6 (six) hours as needed.    Marland Kitchen albuterol (PROVENTIL HFA;VENTOLIN HFA) 108 (90 BASE) MCG/ACT inhaler Inhale 2 puffs into the lungs every 6 (six) hours as needed. Shortness of breath    . benzonatate (TESSALON) 100 MG capsule Take 100 mg by mouth 3 (three) times daily as needed. cough    . cyclobenzaprine (FLEXERIL) 10 MG tablet Take 10 mg by mouth 3 (three) times daily as needed. Muscle spasms    . dicyclomine (BENTYL) 20 MG tablet Take 20 mg by mouth 4 (four) times daily -  before meals and at bedtime.    . fluticasone (VERAMYST) 27.5 MCG/SPRAY nasal spray Place 2 sprays into the nose daily as needed for allergies.     Marland Kitchen  Fluticasone-Salmeterol (ADVAIR) 250-50 MCG/DOSE AEPB Inhale 1 puff into the lungs every 12 (twelve) hours.    Marland Kitchen HYDROcodone-acetaminophen (NORCO/VICODIN) 5-325 MG per tablet Take 1 tablet by mouth every 6 (six) hours as needed for moderate pain.    . hydrocortisone (PROCTOCORT) 1 % CREA Apply 1 Dose topically 2 (two) times daily. For up to 7 days. 28.35 g 0  . levETIRAcetam (KEPPRA) 500 MG tablet Take 500 mg by mouth daily.    Marland Kitchen loratadine (CLARITIN) 10 MG tablet Take 10 mg by mouth  daily as needed for allergies. allergies    . ondansetron (ZOFRAN) 4 MG tablet Take 4 mg by mouth every 8 (eight) hours as needed for nausea or vomiting.    . pantoprazole (PROTONIX) 40 MG tablet Take 40 mg by mouth daily.    Marland Kitchen tiotropium (SPIRIVA) 18 MCG inhalation capsule Place 18 mcg into inhaler and inhale daily.    . predniSONE (DELTASONE) 10 MG tablet Take 2 tablets (20 mg total) by mouth 2 (two) times daily with a meal. (Patient not taking: Reported on 03/31/2015) 16 tablet 0   No current facility-administered medications for this visit.    Allergies as of 03/31/2015 - Review Complete 03/31/2015  Allergen Reaction Noted  . Sulfa antibiotics Hives and Itching 07/11/2012    Family History  Problem Relation Age of Onset  . Colon cancer Neg Hx   . Hypertension Mother   . CVA Mother   . Parkinson's disease Mother   . Emphysema Father     Social History   Social History  . Marital Status: Legally Separated    Spouse Name: N/A  . Number of Children: N/A  . Years of Education: N/A   Social History Main Topics  . Smoking status: Former Smoker    Quit date: 05/10/2007  . Smokeless tobacco: Former Systems developer    Quit date: 05/09/1974     Comment: Quit x 7 years  . Alcohol Use: No  . Drug Use: No  . Sexual Activity: Not Asked   Other Topics Concern  . None   Social History Narrative    Review of Systems: 10-point ROS negative except as per HPI.   Physical Exam: BP 118/75 mmHg  Pulse 62  Temp(Src) 98.2 F (36.8 C) (Oral)  Ht 5\' 2"  (1.575 m)  Wt 165 lb 9.6 oz (75.116 kg)  BMI 30.28 kg/m2 General:   Alert and oriented. Pleasant and cooperative. Well-nourished and well-developed.  Head:  Normocephalic and atraumatic. Eyes:  Without icterus, sclera clear and conjunctiva pink.  Ears:  Normal auditory acuity. Cardiovascular:  S1, S2 present without murmurs appreciated. Extremities without clubbing or edema. Respiratory:  Clear to auscultation bilaterally. No wheezes,  rales, or rhonchi. No distress.  Gastrointestinal:  +BS, soft, and non-distended. Suprapubic TTP, possible CVAT. No HSM noted. No guarding or rebound. No masses appreciated.  Rectal:  Deferred  Neurologic:  Alert and oriented x4;  grossly normal neurologically. Psych:  Alert and cooperative. Normal mood and affect. Heme/Lymph/Immune: No excessive bruising noted.    03/31/2015 8:22 AM

## 2015-03-31 NOTE — Patient Instructions (Signed)
1. As we discussed, please call your primary care provider and informed him you saw [place] and we think he may have a urinary tract infection. They may want to test your urine and possibly give you an antibiotic if you do have a urinary tract infection. 2. Start taking your Bentyl again for the diarrhea. 3. Return for follow-up in 2 months so we can check on your symptoms and schedule you for your next colonoscopy. 4. If you start having worsening symptoms before then, please give [place] a call and we can see you sooner.

## 2015-04-07 NOTE — Progress Notes (Signed)
CC'ED TO PCP 

## 2015-05-19 ENCOUNTER — Encounter: Payer: Self-pay | Admitting: Gastroenterology

## 2015-06-01 ENCOUNTER — Ambulatory Visit: Payer: Medicare Other | Admitting: Nurse Practitioner

## 2015-06-04 ENCOUNTER — Ambulatory Visit: Payer: Medicare Other | Admitting: Nurse Practitioner

## 2015-06-23 ENCOUNTER — Encounter: Payer: Self-pay | Admitting: Gastroenterology

## 2017-01-18 ENCOUNTER — Ambulatory Visit: Payer: Medicare Other | Admitting: Nurse Practitioner

## 2017-01-18 NOTE — Progress Notes (Deleted)
Primary Care Physician:  Celedonio Savage, MD Primary Gastroenterologist:  Dr. Oneida Alar  No chief complaint on file.   HPI:   Paula Carrillo is a 61 y.o. female who presents for evaluation of rectal bleeding.  The patient has not been seen in our office since 05/09/2014.  Noted history of IBS, chronic abdominal pain, colon cancer status post partial colectomy.  Last colonoscopy 2014 with a single 6 mm polyp in the ascending colon, 2 sessile polyps in the rectum between 3-7 mm, and rectal bleeding to the large internal hemorrhoids status post banding x3, normal anastomosis.  Polypectomy in the ascending colon required clipping.  At her last visit she was having diarrhea which resolved after ER treatment.  Lower abdominal pain described as crampy, improved on Bentyl and Zofran.  No other GI symptoms.  Appetite was returning at her last visit.  Noted constipation with a bowel movement every 2-3 weeks but was taken off Amitiza due to diarrhea.  Recommended stool studies, stop Bentyl if constipation returns, follow-up in 1 month.  Stool studies including stool culture, C. difficile, Giardia were all negative.  She was a no-show to her follow-up visit.  Today she states   Past Medical History:  Diagnosis Date  . Asthma   . Cancer ([place])   . Chronic abdominal pain   . Chronic back pain   . Chronic neck pain   . Chronic pain   . Colon cancer ([place])   . COPD (chronic obstructive pulmonary disease) ([place])   . Hemorrhoids   . IBS (irritable bowel syndrome)   . Migraines     Past Surgical History:  Procedure Laterality Date  . ABDOMINAL HYSTERECTOMY    . APPENDECTOMY    . BREAST LUMPECTOMY    . COLON RESECTION    . COLONOSCOPY  07/17/09   LGX:QJJHE internal hemorrhoids/tortuous colon/3-mm sessile hepatic polyp/sigmoid colon diverticula, small internal hemorrhoids, path: tubular adenoma    Current Outpatient Medications  Medication Sig Dispense Refill  . acetaminophen (TYLENOL) 325 MG tablet  Take 325 mg by mouth every 6 (six) hours as needed.    Marland Kitchen albuterol (PROVENTIL HFA;VENTOLIN HFA) 108 (90 BASE) MCG/ACT inhaler Inhale 2 puffs into the lungs every 6 (six) hours as needed. Shortness of breath    . benzonatate (TESSALON) 100 MG capsule Take 100 mg by mouth 3 (three) times daily as needed. cough    . cyclobenzaprine (FLEXERIL) 10 MG tablet Take 10 mg by mouth 3 (three) times daily as needed. Muscle spasms    . dicyclomine (BENTYL) 20 MG tablet Take 20 mg by mouth 4 (four) times daily -  before meals and at bedtime.    . fluticasone (VERAMYST) 27.5 MCG/SPRAY nasal spray Place 2 sprays into the nose daily as needed for allergies.     . Fluticasone-Salmeterol (ADVAIR) 250-50 MCG/DOSE AEPB Inhale 1 puff into the lungs every 12 (twelve) hours.    Marland Kitchen HYDROcodone-acetaminophen (NORCO/VICODIN) 5-325 MG per tablet Take 1 tablet by mouth every 6 (six) hours as needed for moderate pain.    . hydrocortisone (PROCTOCORT) 1 % CREA Apply 1 Dose topically 2 (two) times daily. For up to 7 days. 28.35 g 0  . levETIRAcetam (KEPPRA) 500 MG tablet Take 500 mg by mouth daily.    Marland Kitchen loratadine (CLARITIN) 10 MG tablet Take 10 mg by mouth daily as needed for allergies. allergies    . ondansetron (ZOFRAN) 4 MG tablet Take 4 mg by mouth every 8 (eight) hours as needed  for nausea or vomiting.    . pantoprazole (PROTONIX) 40 MG tablet Take 40 mg by mouth daily.    Marland Kitchen tiotropium (SPIRIVA) 18 MCG inhalation capsule Place 18 mcg into inhaler and inhale daily.     No current facility-administered medications for this visit.     Allergies as of 01/18/2017 - Review Complete 03/31/2015  Allergen Reaction Noted  . Sulfa antibiotics Hives and Itching 07/11/2012    Family History  Problem Relation Age of Onset  . Colon cancer Neg Hx   . Hypertension Mother   . CVA Mother   . Parkinson's disease Mother   . Emphysema Father     Social History   Socioeconomic History  . Marital status: Legally Separated     Spouse name: Not on file  . Number of children: Not on file  . Years of education: Not on file  . Highest education level: Not on file  Social Needs  . Financial resource strain: Not on file  . Food insecurity - worry: Not on file  . Food insecurity - inability: Not on file  . Transportation needs - medical: Not on file  . Transportation needs - non-medical: Not on file  Occupational History  . Not on file  Tobacco Use  . Smoking status: Former Smoker    Last attempt to quit: 05/10/2007    Years since quitting: 9.7  . Smokeless tobacco: Former Systems developer    Quit date: 05/09/1974  . Tobacco comment: Quit x 7 years  Substance and Sexual Activity  . Alcohol use: No    Alcohol/week: 0.0 oz  . Drug use: No  . Sexual activity: Not on file  Other Topics Concern  . Not on file  Social History Narrative  . Not on file    Review of Systems: General: Negative for anorexia, weight loss, fever, chills, fatigue, weakness. Eyes: Negative for vision changes.  ENT: Negative for hoarseness, difficulty swallowing , nasal congestion. CV: Negative for chest pain, angina, palpitations, dyspnea on exertion, peripheral edema.  Respiratory: Negative for dyspnea at rest, dyspnea on exertion, cough, sputum, wheezing.  GI: See history of present illness. GU:  Negative for dysuria, hematuria, urinary incontinence, urinary frequency, nocturnal urination.  MS: Negative for joint pain, low back pain.  Derm: Negative for rash or itching.  Neuro: Negative for weakness, abnormal sensation, seizure, frequent headaches, memory loss, confusion.  Psych: Negative for anxiety, depression, suicidal ideation, hallucinations.  Endo: Negative for unusual weight change.  Heme: Negative for bruising or bleeding. Allergy: Negative for rash or hives.    Physical Exam: There were no vitals taken for this visit. General:   Alert and oriented. Pleasant and cooperative. Well-nourished and well-developed.  Head:  Normocephalic  and atraumatic. Eyes:  Without icterus, sclera clear and conjunctiva pink.  Ears:  Normal auditory acuity. Mouth:  No deformity or lesions, oral mucosa pink.  Throat/Neck:  Supple, without mass or thyromegaly. Cardiovascular:  S1, S2 present without murmurs appreciated. Normal pulses noted. Extremities without clubbing or edema. Respiratory:  Clear to auscultation bilaterally. No wheezes, rales, or rhonchi. No distress.  Gastrointestinal:  +BS, soft, non-tender and non-distended. No HSM noted. No guarding or rebound. No masses appreciated.  Rectal:  Deferred  Musculoskalatal:  Symmetrical without gross deformities. Normal posture. Skin:  Intact without significant lesions or rashes. Neurologic:  Alert and oriented x4;  grossly normal neurologically. Psych:  Alert and cooperative. Normal mood and affect. Heme/Lymph/Immune: No significant cervical adenopathy. No excessive bruising noted.  01/18/2017 8:43 AM   Disclaimer: This note was dictated with voice recognition software. Similar sounding words can inadvertently be transcribed and may not be corrected upon review.

## 2017-03-17 ENCOUNTER — Ambulatory Visit: Payer: Medicaid Other | Admitting: Nurse Practitioner

## 2017-03-17 ENCOUNTER — Telehealth: Payer: Self-pay | Admitting: Nurse Practitioner

## 2017-03-17 ENCOUNTER — Encounter: Payer: Self-pay | Admitting: Nurse Practitioner

## 2017-03-17 NOTE — Telephone Encounter (Signed)
Noted  

## 2017-03-17 NOTE — Telephone Encounter (Signed)
PATIENT WAS A NO SHOW AND LETTER SENT  °

## 2018-01-04 NOTE — Progress Notes (Signed)
REVIEWED-NO ADDITIONAL RECOMMENDATIONS. 

## 2018-03-23 ENCOUNTER — Encounter: Payer: Self-pay | Admitting: Gastroenterology

## 2018-03-23 ENCOUNTER — Ambulatory Visit: Payer: Medicaid Other | Admitting: Gastroenterology

## 2020-07-17 ENCOUNTER — Other Ambulatory Visit: Payer: Self-pay | Admitting: Otolaryngology

## 2020-07-17 DIAGNOSIS — E041 Nontoxic single thyroid nodule: Secondary | ICD-10-CM

## 2020-07-27 ENCOUNTER — Other Ambulatory Visit: Payer: Medicare Other

## 2020-07-29 ENCOUNTER — Other Ambulatory Visit: Payer: Self-pay

## 2020-07-29 ENCOUNTER — Encounter: Payer: Self-pay | Admitting: Internal Medicine

## 2020-07-29 ENCOUNTER — Ambulatory Visit (INDEPENDENT_AMBULATORY_CARE_PROVIDER_SITE_OTHER): Payer: Medicare Other | Admitting: Internal Medicine

## 2020-07-29 DIAGNOSIS — R058 Other specified cough: Secondary | ICD-10-CM | POA: Diagnosis not present

## 2020-07-29 DIAGNOSIS — J449 Chronic obstructive pulmonary disease, unspecified: Secondary | ICD-10-CM | POA: Diagnosis not present

## 2020-07-29 DIAGNOSIS — F17210 Nicotine dependence, cigarettes, uncomplicated: Secondary | ICD-10-CM | POA: Diagnosis not present

## 2020-07-29 MED ORDER — SPIRIVA RESPIMAT 2.5 MCG/ACT IN AERS: 2.0000 | INHALATION_SPRAY | Freq: Every day | RESPIRATORY_TRACT | 0 refills | Status: DC

## 2020-07-29 MED ORDER — PREDNISONE 10 MG PO TABS: ORAL_TABLET | ORAL | 0 refills | Status: DC

## 2020-07-29 MED ORDER — SPIRIVA RESPIMAT 2.5 MCG/ACT IN AERS: INHALATION_SPRAY | RESPIRATORY_TRACT | 11 refills | Status: DC

## 2020-07-29 NOTE — Progress Notes (Addendum)
Paula Carrillo, female    DOB: 01/01/1956,    MRN: [id number]   Brief patient profile:  46 yowf active smoker says dx with copd around 2012 and maint on advair and spiriva and referred to pulmonary clinic in [facility]  07/29/2020 by Dr   Huel Cote with worsening sob/cough with globus on advair/spiriva dpi    07/13/20 ent eval: moderate interarytenoid edema and erythema consistent with laryngopharyngeal reflux. 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 Referred to [facility] voice center Thyroid nodule x 4 cm > thyroid US    History of Present Illness  07/29/2020  Pulmonary/ 1st office eval/ Dalma Panchal / [place] Office  Chief Complaint  Patient presents with  . Consult    Productive cough with white phlegm, sore throat "something growing in it" since March 2022  Dyspnea:  [employer] shopping but has to go to slow stops each aisle = MMRC3 = can't walk 100 yards even at a slow pace at a flat grade s stopping due to sob   Cough: sense of throat congestion/ globus   On advair/ spiriva  Sleep: < 30 degrees /bed is flat with pillows  SABA use: very confused re how/ when to use it 02  None   No obvious day to day or daytime variability or assoc   purulent sputum or mucus plugs or hemoptysis or cp or chest tightness, subjective wheeze or overt sinus or hb symptoms.   Sleeping as above without nocturnal  or early am exacerbation  of respiratory  c/o's or need for noct saba. Also denies any obvious fluctuation of symptoms with weather or environmental changes or other aggravating or alleviating factors except as outlined above   No unusual exposure hx or h/o childhood pna/ asthma or knowledge of premature birth.  Current Allergies, Complete Past Medical History, Past Surgical History, Family History, and Social History were reviewed in [employer] record.  ROS  The following are not active complaints unless  bolded Hoarseness, sore throat, dysphagia, dental problems, itching, sneezing,  nasal congestion or discharge of excess mucus or purulent secretions, ear ache,   fever, chills, sweats, unintended wt loss or wt gain, classically pleuritic or exertional cp,  orthopnea pnd or arm/hand swelling  or leg swelling, presyncope, palpitations, abdominal pain, anorexia, nausea, vomiting, diarrhea  or change in bowel habits or change in bladder habits, change in stools or change in urine, dysuria, hematuria,  rash, arthralgias, visual complaints, headache, numbness, weakness or ataxia or problems with walking or coordination,  change in mood or  memory.           Past Medical History:  Diagnosis Date  . Asthma   . Cancer ([place])   . Chronic abdominal pain   . Chronic back pain   . Chronic neck pain   . Chronic pain   . Colon cancer ([place])   . COPD (chronic obstructive pulmonary disease) ([place])   . Hemorrhoids   . IBS (irritable bowel syndrome)   . Migraines     Outpatient Medications Prior to Visit -  - NOTE:   Unable to verify as accurately reflecting what pt takes     Medication Sig Dispense Refill  . acetaminophen (TYLENOL) 325 MG tablet Take 325 mg by mouth every 6 (six) hours as needed.    Marland Kitchen albuterol (PROVENTIL HFA;VENTOLIN HFA) 108 (90 BASE) MCG/ACT inhaler Inhale 2 puffs into the lungs every 6 (six) hours as needed. Shortness of  breath    . hydrocortisone (PROCTOCORT) 1 % CREA Apply 1 Dose topically 2 (two) times daily. For up to 7 days. 28.35 g 0  . loratadine (CLARITIN) 10 MG tablet Take 10 mg by mouth daily as needed for allergies. allergies    . pantoprazole (PROTONIX) 40 MG tablet Take 40 mg by mouth daily.    . benzonatate (TESSALON) 100 MG capsule Take 100 mg by mouth 3 (three) times daily as needed. cough    . cyclobenzaprine (FLEXERIL) 10 MG tablet Take 10 mg by mouth 3 (three) times daily as needed. Muscle spasms    . dicyclomine (BENTYL) 20 MG tablet Take 20 mg by mouth 4 (four)  times daily -  before meals and at bedtime.    . fluticasone (VERAMYST) 27.5 MCG/SPRAY nasal spray Place 2 sprays into the nose daily as needed for allergies.     . Fluticasone-Salmeterol (ADVAIR) 250-50 MCG/DOSE AEPB Inhale 1 puff into the lungs every 12 (twelve) hours.    Marland Kitchen HYDROcodone-acetaminophen (NORCO/VICODIN) 5-325 MG per tablet Take 1 tablet by mouth every 6 (six) hours as needed for moderate pain. (Patient not taking: Reported on 07/29/2020)    . levETIRAcetam (KEPPRA) 500 MG tablet Take 500 mg by mouth daily.    . ondansetron (ZOFRAN) 4 MG tablet Take 4 mg by mouth every 8 (eight) hours as needed for nausea or vomiting.    . tiotropium (SPIRIVA) 18 MCG inhalation capsule Place 18 mcg into inhaler and inhale daily.     No facility-administered medications prior to visit.     Objective:     BP (!) 152/90 (BP Location: Left Arm, Cuff Size: Normal)   Pulse 67   Temp 98 F (36.7 C) (Temporal)   Ht 5\' 3"  (1.6 m)   Wt 140 lb 12.8 oz (63.9 kg)   SpO2 98% Comment: Room air  BMI 24.94 kg/m   SpO2: 98 % (Room air)   amb wf classic pseudowheeze    HEENT : pt wearing mask not removed for exam due to covid - 19 concerns.    NECK :  without JVD/Nodes/TM/ nl carotid upstrokes bilaterally   LUNGS: no acc muscle use,  Mild barrel  contour chest wall with bilateral  Distant bs s audible wheeze and  without cough on insp or exp maneuvers  and mild  Hyperresonant  to  percussion bilaterally     CV:  RRR  no s3 or murmur or increase in P2, and no edema   ABD:  soft and nontender with pos end  insp Hoover's  in the supine position. No bruits or organomegaly appreciated, bowel sounds nl  MS:   Nl gait/  ext warm without deformities, calf tenderness, cyanosis or clubbing No obvious joint restrictions   SKIN: warm and dry without lesions    NEURO:  alert, approp, nl sensorium with  no motor or cerebellar deficits apparent.      Labs ordered 07/29/2020  :  allergy profile   alpha one  AT phenotype    CXR PA and Lateral:   07/29/2020 :    I personally reviewed images and agree with radiology impression as follows:    did not go for cxr as rec      Assessment   COPD GOLD ? /  active smoker  Active smoker  - Labs ordered 07/29/2020  :  allergy profile   alpha one AT phenotype   - 07/29/2020  After extensive coaching inhaler device,  effectiveness =  spiriva smi 2.5 mg q am and prn saba  I suspect pt is relative mild copd / Pt is Group B in terms of symptom/risk and laba/lama therefore appropriate rx at this point >>>  stiolto best choice with lowest likelihood of aggravating her UACS (see sep a/p) but we don't have samples so rec start with just spriva smi and saba prn with max rx for gerd including diet >>> also so added 6 day taper off  Prednisone starting at 40 mg per day in case of component of Th-2 driven upper or lower airways inflammation (if cough responds short term only to relapse before return while will on full rx for uacs (as above), then  that would point to allergic rhinitis/ asthma or eos bronchitis as alternative dx)   Re saba: I spent extra time with pt today reviewing appropriate use of albuterol for prn use on exertion with the following points: 1) saba is for relief of sob that does not improve by walking a slower pace or resting but rather if the pt does not improve after trying this first. 2) If the pt is convinced, as many are, that saba helps recover from activity faster then it's easy to tell if this is the case by re-challenging : ie stop, take the inhaler, then p 5 minutes try the exact same activity (intensity of workload) that just caused the symptoms and see if they are substantially diminished or not after saba 3) if there is an activity that reproducibly causes the symptoms, try the saba 15 min before the activity on alternate days   If in fact the saba really does help, then fine to continue to use it prn but advised may need to look closer at  the maintenance regimen being used to achieve better control of airways disease with exertion.       Upper airway cough syndrome D/c powdered inhalers 07/29/2020   Upper airway cough syndrome (previously labeled PNDS),  is so named because it's frequently impossible to sort out how much is  CR/sinusitis with freq throat clearing (which can be related to primary GERD)   vs  causing  secondary (" extra esophageal")  GERD from wide swings in gastric pressure that occur with throat clearing, often  promoting self use of mint and menthol lozenges that reduce the lower esophageal sphincter tone and exacerbate the problem further in a cyclical fashion.   These are the same pts (now being labeled as having "irritable larynx syndrome" by some cough centers) who not infrequently have a history of having failed to tolerate ace inhibitors,  dry powder inhalers or biphosphonates or report having atypical/extraesophageal reflux symptoms that don't respond to standard doses of PPI  and are easily confused as having aecopd or asthma flares by even experienced allergists/ pulmonologists (myself included).   >>> agree with eval by [facility] ent if she'll go but in meantime rx off dpi's and on max gerd rx - see avs for instructions unique to this ov        Cigarette smoker Counseled re importance of smoking cessation but did not meet time criteria for separate billing           Each maintenance medication was reviewed in detail including emphasizing most importantly the difference between maintenance and prns and under what circumstances the prns are to be triggered using an action plan format where appropriate.  Total time for H and P, chart review, counseling, reviewing smi (respimat) device(s) and generating customized AVS  unique to this office visit / same day charting = 38 min          Christinia Gully, MD 07/29/2020

## 2020-07-29 NOTE — Assessment & Plan Note (Addendum)
Active smoker  - Labs ordered 07/29/2020  :  allergy profile   alpha one AT phenotype   - 07/29/2020  After extensive coaching inhaler device,  effectiveness =   spiriva smi 2.5 mg q am and prn saba  I suspect pt is relative mild copd / Pt is Group B in terms of symptom/risk and laba/lama therefore appropriate rx at this point >>>  stiolto best choice with lowest likelihood of aggravating her UACS (see sep a/p) but we don't have samples so rec start with just spriva smi and saba prn with max rx for gerd including diet >>> also so added 6 day taper off  Prednisone starting at 40 mg per day in case of component of Th-2 driven upper or lower airways inflammation (if cough responds short term only to relapse before return while will on full rx for uacs (as above), then  that would point to allergic rhinitis/ asthma or eos bronchitis as alternative dx)   Re saba: I spent extra time with pt today reviewing appropriate use of albuterol for prn use on exertion with the following points: 1) saba is for relief of sob that does not improve by walking a slower pace or resting but rather if the pt does not improve after trying this first. 2) If the pt is convinced, as many are, that saba helps recover from activity faster then it's easy to tell if this is the case by re-challenging : ie stop, take the inhaler, then p 5 minutes try the exact same activity (intensity of workload) that just caused the symptoms and see if they are substantially diminished or not after saba 3) if there is an activity that reproducibly causes the symptoms, try the saba 15 min before the activity on alternate days   If in fact the saba really does help, then fine to continue to use it prn but advised may need to look closer at the maintenance regimen being used to achieve better control of airways disease with exertion.

## 2020-07-29 NOTE — Patient Instructions (Addendum)
The key is to stop smoking completely before smoking completely stops you!    Plan A = Automatic = Always=    spiriva 2 puffs each am and stop powdered advair and spiriva   Prednisone 10 mg take  4 each am x 2 days,   2 each am x 2 days,  1 each am x 2 days and stop   Plan B = Backup (to supplement plan A, not to replace it) Only use your albuterol inhaler as a rescue medication to be used if you can't catch your breath by resting or doing a relaxed purse lip breathing pattern.  - The less you use it, the better it will work when you need it. - Ok to use the inhaler up to 2 puffs  every 4 hours if you must but call for appointment if use goes up over your usual need - Don't leave home without it !!  (think of it like the spare tire for your car)   Plan C = Crisis (instead of Plan B but only if Plan B stops working) - only use your albuterol nebulizer if you first try Plan B and it fails to help > ok to use the nebulizer up to every 4 hours but if start needing it regularly call for immediate appointment  GERD (REFLUX)  is an extremely common cause of respiratory symptoms just like yours , many times with no obvious heartburn at all.    It can be treated with medication, but also with lifestyle changes including elevation of the head of your bed (ideally with 6 -8inch blocks under the headboard of your bed),  Smoking cessation, avoidance of late meals, excessive alcohol, and avoid fatty foods, chocolate, peppermint, colas, red wine, and acidic juices such as orange juice.  NO MINT OR MENTHOL PRODUCTS SO NO COUGH DROPS  USE SUGARLESS CANDY INSTEAD (Jolley ranchers or Stover's or Life Savers) or even ice chips will also do - the key is to swallow to prevent all throat clearing. NO OIL BASED VITAMINS - use powdered substitutes.  Avoid fish oil when coughing.     Please schedule a follow up office visit in 2 weeks, call sooner if needed with all medications /inhalers/ solutions in hand so we can  verify exactly what you are taking. This includes all medications from all doctors and over the counters

## 2020-07-30 DIAGNOSIS — F17210 Nicotine dependence, cigarettes, uncomplicated: Secondary | ICD-10-CM | POA: Insufficient documentation

## 2020-07-30 DIAGNOSIS — R058 Other specified cough: Secondary | ICD-10-CM | POA: Insufficient documentation

## 2020-07-30 NOTE — Assessment & Plan Note (Addendum)
Counseled re importance of smoking cessation but did not meet time criteria for separate billing           Each maintenance medication was reviewed in detail including emphasizing most importantly the difference between maintenance and prns and under what circumstances the prns are to be triggered using an action plan format where appropriate.  Total time for H and P, chart review, counseling, reviewing smi (respimat) device(s) and generating customized AVS unique to this office visit / same day charting = 38 min

## 2020-07-30 NOTE — Assessment & Plan Note (Signed)
D/c powdered inhalers 07/29/2020   Upper airway cough syndrome (previously labeled PNDS),  is so named because it's frequently impossible to sort out how much is  CR/sinusitis with freq throat clearing (which can be related to primary GERD)   vs  causing  secondary (" extra esophageal")  GERD from wide swings in gastric pressure that occur with throat clearing, often  promoting self use of mint and menthol lozenges that reduce the lower esophageal sphincter tone and exacerbate the problem further in a cyclical fashion.   These are the same pts (now being labeled as having "irritable larynx syndrome" by some cough centers) who not infrequently have a history of having failed to tolerate ace inhibitors,  dry powder inhalers or biphosphonates or report having atypical/extraesophageal reflux symptoms that don't respond to standard doses of PPI  and are easily confused as having aecopd or asthma flares by even experienced allergists/ pulmonologists (myself included).   >>> agree with eval by [facility] ent if she'll go but in meantime rx off dpi's and on max gerd rx - see avs for instructions unique to this ov

## 2020-07-31 ENCOUNTER — Ambulatory Visit
Admission: RE | Admit: 2020-07-31 | Discharge: 2020-07-31 | Disposition: A | Discharge status: Home / Self Care | Payer: Medicare Other | Source: Ambulatory Visit | Attending: Otolaryngology | Admitting: Otolaryngology

## 2020-07-31 DIAGNOSIS — E041 Nontoxic single thyroid nodule: Secondary | ICD-10-CM

## 2020-08-05 ENCOUNTER — Ambulatory Visit (HOSPITAL_COMMUNITY)
Admission: RE | Admit: 2020-08-05 | Discharge: 2020-08-05 | Disposition: A | Discharge status: Home / Self Care | Payer: Medicare Other | Source: Ambulatory Visit | Attending: Internal Medicine | Admitting: Internal Medicine

## 2020-08-05 ENCOUNTER — Other Ambulatory Visit: Payer: Self-pay

## 2020-08-05 DIAGNOSIS — J449 Chronic obstructive pulmonary disease, unspecified: Secondary | ICD-10-CM | POA: Diagnosis present

## 2020-08-07 ENCOUNTER — Encounter: Payer: Self-pay | Admitting: *Deleted

## 2020-08-07 ENCOUNTER — Telehealth: Payer: Self-pay | Admitting: Internal Medicine

## 2020-08-07 NOTE — Telephone Encounter (Signed)
Called and spoke with patient who stated she was originally returning phone call and patient now stating that she listened to her messages again and it was a call reminding her of her upcoming office visit with Dr Melvyn Novas on 08/11/20 at 11:30am. Patient also asked if there results on the xray. Per Dr Melvyn Novas:    Paula Rockers, MD  08/06/2020 4:24 PM EDT      Notify pt: Reviewed cxr and no acute change so no change in recommendations made at [facility] over xray results per Dr Melvyn Novas with patient. All questions answered and patient expressed full understanding. Nothing further needed at this time.

## 2020-08-11 ENCOUNTER — Ambulatory Visit (INDEPENDENT_AMBULATORY_CARE_PROVIDER_SITE_OTHER): Payer: Medicare Other | Admitting: Internal Medicine

## 2020-08-11 ENCOUNTER — Other Ambulatory Visit: Payer: Self-pay

## 2020-08-11 VITALS — BP 118/80 | HR 79 | Temp 98.4°F | Ht 63.0 in | Wt 142.2 lb

## 2020-08-11 DIAGNOSIS — F17210 Nicotine dependence, cigarettes, uncomplicated: Secondary | ICD-10-CM | POA: Diagnosis not present

## 2020-08-11 DIAGNOSIS — R058 Other specified cough: Secondary | ICD-10-CM | POA: Diagnosis not present

## 2020-08-11 DIAGNOSIS — J449 Chronic obstructive pulmonary disease, unspecified: Secondary | ICD-10-CM

## 2020-08-11 MED ORDER — PANTOPRAZOLE SODIUM 40 MG PO TBEC: 40.0000 mg | DELAYED_RELEASE_TABLET | Freq: Every day | ORAL | 2 refills | Status: DC

## 2020-08-11 MED ORDER — FAMOTIDINE 20 MG PO TABS: ORAL_TABLET | ORAL | 11 refills | Status: DC

## 2020-08-11 MED ORDER — ALBUTEROL SULFATE (2.5 MG/3ML) 0.083% IN NEBU
2.5000 mg | INHALATION_SOLUTION | RESPIRATORY_TRACT | Status: DC | PRN
Start: 2020-08-11 — End: 2021-07-19

## 2020-08-11 MED ORDER — PREDNISONE 10 MG PO TABS: ORAL_TABLET | ORAL | 0 refills | Status: DC

## 2020-08-11 NOTE — Patient Instructions (Addendum)
I very strongly recommend you get the moderna or pfizer vaccine as soon as possible based on your risk of dying from the virus  and the proven safety and benefit of these vaccines against even the delta and omicron variants.  This can save your life as well as  those of your loved ones,  especially if they are also not vaccinated.    The key is to stop smoking completely before smoking completely stops you!    Pantoprazole (protonix) 40 mg   Take  30-60 min before first meal of the day and Pepcid (famotidine)  20 mg after supper  And stop oil based vitamin  GERD (REFLUX)  is an extremely common cause of respiratory symptoms just like yours , many times with no obvious heartburn at all.    It can be treated with medication, but also with lifestyle changes including elevation of the head of your bed (ideally with 6 -8inch blocks under the headboard of your bed),  Smoking cessation, avoidance of late meals, excessive alcohol, and avoid fatty foods, chocolate, peppermint, colas, red wine, and acidic juices such as orange juice.  NO MINT OR MENTHOL PRODUCTS SO NO COUGH DROPS  USE SUGARLESS CANDY INSTEAD (Jolley ranchers or Stover's or Life Savers) or even ice chips will also do - the key is to swallow to prevent all throat clearing. NO OIL BASED VITAMINS - use powdered substitutes.  Avoid fish oil when coughing.   Continue spiriva 2.5  2 pffs first thing in am  For cough > mucinex dm 1200 mg every every hours as needed   Prednisone 10 mg 2 daily until breathing then 1 daily take with breakfast until prednisone    Please remember to go to the lab department @ [facility] for your tests - we will call you with the results when they are available.        Please schedule a follow up office visit in 6-8  weeks with PFTs on return

## 2020-08-11 NOTE — Progress Notes (Addendum)
Paula Carrillo, female    DOB: 1956/02/26     MRN: [id number]   Brief patient profile:  52 yowf active smoker says dx with copd around 2012 and maint on advair and spiriva and referred to pulmonary clinic in [facility]  07/29/2020 by Dr   Huel Cote with worsening sob/cough with globus on advair/spiriva dpi    07/13/20 ent eval: moderate interarytenoid edema and erythema consistent with laryngopharyngeal reflux. 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 Referred to [facility] voice center Thyroid nodule x 4 cm > thyroid US    History of Present Illness  07/29/2020  Pulmonary/ 1st office eval/ Kaston Faughn / [place] [facility]  Chief Complaint  Patient presents with  . Consult    Productive cough with white phlegm, sore throat "something growing in it" since March 2022  Dyspnea:  [employer] shopping but has to go to slow stops each aisle = MMRC3 = can't walk 100 yards even at a slow pace at a flat grade s stopping due to sob   Cough: sense of throat congestion/ globus  on advair/ spiriva  Sleep: < 30 degrees /bed is flat with pillows  SABA use: very confused re how/ when to use it 02  None  rec The key is to stop smoking completely before smoking completely stops you! Plan A = Automatic = Always=    spiriva 2 puffs each am and stop powdered advair and spiriva  Prednisone 10 mg take  4 each am x 2 days,   2 each am x 2 days,  1 each am x 2 days and stop  Plan B = Backup (to supplement plan A, not to replace it) Only use your albuterol inhaler as a rescue medicatio Plan C = Crisis (instead of Plan B but only if Plan B stops working) - only use your albuterol nebulizer if you first try Plan B and it fails to help > ok to use the nebulizer up to every 4 hours but if start needing it regularly call for immediate appointment GERD  diet  Please schedule a follow up office visit in 2 weeks, call sooner if needed with all medications /inhalers/ solutions  in hand so we can verify exactly what you are taking. This includes all medications from all doctors and over the counters     08/11/2020  f/u ov/ office/Tobie Perdue re: probable copd/CB Chief Complaint  Patient presents with  . Follow-up  Dyspnea:  Walks mb 50 ft Cough: worse at hs / mucus white  Sleeping: bed is flat with sev pillows  SABA use: much loss  02: none  Covid status: never vax / never got virus      No obvious day to day or daytime variability or   purulent sputum or mucus plugs or hemoptysis or cp or chest tightness, subjective wheeze or overt sinus or hb symptoms.     Also denies any obvious fluctuation of symptoms with weather or environmental changes or other aggravating or alleviating factors except as outlined above   No unusual exposure hx or h/o childhood pna/ asthma or knowledge of premature birth.  Current Allergies, Complete Past Medical History, Past Surgical History, Family History, and Social History were reviewed in [employer] record.  ROS  The following are not active complaints unless bolded Hoarseness, sore throat, dysphagia, dental problems, itching, sneezing,  nasal congestion or discharge of excess mucus or purulent secretions, ear ache,   fever, chills,  sweats, unintended wt loss or wt gain, classically pleuritic or exertional cp,  orthopnea pnd or arm/hand swelling  or leg swelling, presyncope, palpitations, abdominal pain, anorexia, nausea, vomiting, diarrhea  or change in bowel habits or change in bladder habits, change in stools or change in urine, dysuria, hematuria,  rash, arthralgias, visual complaints, headache, numbness, weakness or ataxia or problems with walking or coordination,  change in mood or  memory.        Current Meds  Medication Sig  . albuterol (PROVENTIL HFA;VENTOLIN HFA) 108 (90 BASE) MCG/ACT inhaler Inhale 2 puffs into the lungs every 6 (six) hours as needed. Shortness of breath  . Calcium  Carb-Cholecalciferol (SUPER CALCIUM 600 + D 400 PO) Take 1 tablet by mouth daily.  . hydrOXYzine (ATARAX/VISTARIL) 25 MG tablet Take 25 mg by mouth 3 (three) times daily as needed.  . montelukast (SINGULAIR) 10 MG tablet Take 10 mg by mouth at bedtime.  . naproxen sodium (ALEVE) 220 MG tablet Take 220 mg by mouth 2 (two) times daily as needed.  . Omega-3 Fatty Acids (FISH OIL) 1000 MG CAPS Take 1 capsule by mouth daily.  Marland Kitchen omeprazole (PRILOSEC) 20 MG capsule Take 20 mg by mouth daily.  . predniSONE (DELTASONE) 10 MG tablet Take  4 each am x 2 days,   2 each am x 2 days,  1 each am x 2 days and stop  . Tiotropium Bromide Monohydrate (SPIRIVA RESPIMAT) 2.5 MCG/ACT AERS 2 puffs each am  . vitamin B-12 (CYANOCOBALAMIN) 500 MCG tablet Take 500 mcg by mouth daily.                   Past Medical History:  Diagnosis Date  . Asthma   . Cancer ([place])   . Chronic abdominal pain   . Chronic back pain   . Chronic neck pain   . Chronic pain   . Colon cancer ([place])   . COPD (chronic obstructive pulmonary disease) ([place])   . Hemorrhoids   . IBS (irritable bowel syndrome)   . Migraines        Objective:     Wt Readings from Last 3 Encounters:  07/29/20 140 lb 12.8 oz (63.9 kg)  03/31/15 165 lb 9.6 oz (75.1 kg)  08/11/14 170 lb (77.1 kg)      Vital signs reviewed  08/11/2020  - Note at rest 02 sats  96% on RA   General appearance:  Chronically ill amb wf congested cough   HEENT : pt wearing mask not removed for exam due to covid -19 concerns.    NECK :  without JVD/Nodes/TM/ nl carotid upstrokes bilaterally   LUNGS: no acc muscle use,  Mod barrel  contour chest wall with bilateral exp rhonchi  and  without cough on insp or exp maneuvers and mod  Hyperresonant  to  percussion bilaterally     CV:  RRR  no s3 or murmur or increase in P2, and no edema   ABD:  soft and nontender with pos mid insp Hoover's  in the supine position. No bruits or organomegaly appreciated, bowel sounds  nl  MS:     ext warm without deformities, calf tenderness, cyanosis or clubbing No obvious joint restrictions   SKIN: warm and dry without lesions    NEURO:  alert, approp, nl sensorium with  no motor or cerebellar deficits apparent.            Labs ordered 08/11/2020   :  allergy profile  alpha one AT phenotype     I personally reviewed images and agree with radiology impression as follows:  CXR:   08/05/20 pa and lat 1. Mild diffuse peribronchial cuffing, concerning for potential bronchitis. 2. Aortic atherosclerosis.    Assessment

## 2020-08-12 ENCOUNTER — Encounter: Payer: Self-pay | Admitting: Internal Medicine

## 2020-08-12 NOTE — Assessment & Plan Note (Addendum)
D/c powdered inhalers 07/29/2020  - added max rx for gerd 08/11/2020   Upper airway cough syndrome (previously labeled PNDS),  is so named because it's frequently impossible to sort out how much is  CR/sinusitis with freq throat clearing (which can be related to primary GERD)   vs  causing  secondary (" extra esophageal")  GERD from wide swings in gastric pressure that occur with throat clearing, often  promoting self use of mint and menthol lozenges that reduce the lower esophageal sphincter tone and exacerbate the problem further in a cyclical fashion.   These are the same pts (now being labeled as having "irritable larynx syndrome" by some cough centers) who not infrequently have a history of having failed to tolerate ace inhibitors,  dry powder inhalers or biphosphonates or report having atypical/extraesophageal reflux symptoms/dysphagia/globus that don't respond to standard doses of PPI  and are easily confused as having aecopd or asthma flares by even experienced allergists/ pulmonologists (myself included).   >>> leave off dpi's and rx for gerd advised

## 2020-08-12 NOTE — Assessment & Plan Note (Addendum)
Active smoker  - Labs ordered 07/29/2020  :  allergy profile   alpha one AT phenotype   - 07/29/2020  After extensive coaching inhaler device,  effectiveness =   spiriva smi 2.5 mg q am and prn saba - Labs ordered 08/11/2020  :  allergy profile   alpha one AT phenotype   - 08/11/2020 started daily pred 20 mg until better then 10 mg daily   - The proper method of use, as well as anticipated side effects, of a metered-dose inhaler were discussed and demonstrated to the patient using teach back method. Improved effectiveness after extensive coaching during this visit to a level of approximately 90 % from a baseline of 75 % > continue smi as least likely to aggravate uacs (see separate a/p)     Group D in terms of symptom/risk and laba/lama/ICS  therefore appropriate rx at this point >>>  Cannot afford combination rx so will use the spiriva she has for now and supplement with prednisone and saba  Re saba: I spent extra time with pt today reviewing appropriate use of albuterol for prn use on exertion with the following points: 1) saba is for relief of sob that does not improve by walking a slower pace or resting but rather if the pt does not improve after trying this first. 2) If the pt is convinced, as many are, that saba helps recover from activity faster then it's easy to tell if this is the case by re-challenging : ie stop, take the inhaler, then p 5 minutes try the exact same activity (intensity of workload) that just caused the symptoms and see if they are substantially diminished or not after saba 3) if there is an activity that reproducibly causes the symptoms, try the saba 15 min before the activity on alternate days   If in fact the saba really does help, then fine to continue to use it prn but advised may need to look closer at the maintenance regimen being used to achieve better control of airways disease with exertion.

## 2020-08-12 NOTE — Assessment & Plan Note (Signed)
Counseled re importance of smoking cessation but did not meet time criteria for separate billing            Each maintenance medication was reviewed in detail including emphasizing most importantly the difference between maintenance and prns and under what circumstances the prns are to be triggered using an action plan format where appropriate.  Total time for H and P, chart review, counseling, reviewing smi  device(s) and generating customized AVS unique to this office visit / same day charting = > 30 min

## 2020-09-02 ENCOUNTER — Other Ambulatory Visit: Payer: Self-pay | Admitting: Otolaryngology

## 2020-09-02 DIAGNOSIS — E041 Nontoxic single thyroid nodule: Secondary | ICD-10-CM

## 2020-09-08 ENCOUNTER — Telehealth: Payer: Self-pay | Admitting: Internal Medicine

## 2020-09-08 MED ORDER — PREDNISONE 10 MG PO TABS: ORAL_TABLET | ORAL | 0 refills | Status: DC

## 2020-09-08 NOTE — Telephone Encounter (Signed)
Called the pt  She has not picked up pred that we called in 08/11/20  She states she needs [place] to [facility] ask [facility] to deliver  [facility] and was advised that they do not deliver  I called the pt back and made here aware  She not would like rx to be sent to [facility]  I called them and gave pt address and confrimed they deliver  Pt should received med today and she has been made aware

## 2020-09-28 ENCOUNTER — Encounter (HOSPITAL_COMMUNITY): Payer: Self-pay | Admitting: Speech Pathology

## 2020-09-28 ENCOUNTER — Ambulatory Visit (HOSPITAL_COMMUNITY): Payer: Medicare Other | Attending: Otolaryngology | Admitting: Speech Pathology

## 2020-09-28 ENCOUNTER — Other Ambulatory Visit: Payer: Self-pay

## 2020-09-28 DIAGNOSIS — R490 Dysphonia: Secondary | ICD-10-CM | POA: Diagnosis present

## 2020-09-28 NOTE — Therapy (Signed)
[place], [place], [address] Phone: [phone]   Fax:  [phone]  Speech Language Pathology Evaluation  Patient Details  Name: Paula Carrillo MRN: [id number] Date of Birth: [birth year] Referring Provider (SLP): Ebbie Latus DO   Encounter Date: 09/28/2020   End of Session - 09/28/20 1205     Visit Number 1    Number of Visits 1    Authorization Type UHC Medicare   OOP 7,550  no co-pay, no auth, no co-ins, no visit limit  [facility] Portal  Medicaid Secondary   SLP Start Time 1030    SLP Stop Time  22    SLP Time Calculation (min) 45 min    Activity Tolerance Patient tolerated treatment well             Past Medical History:  Diagnosis Date   Asthma    Cancer ([place])    Chronic abdominal pain    Chronic back pain    Chronic neck pain    Chronic pain    Colon cancer ([facility])    COPD (chronic obstructive pulmonary disease) ([place])    Hemorrhoids    IBS (irritable bowel syndrome)    Migraines     Past Surgical History:  Procedure Laterality Date   ABDOMINAL HYSTERECTOMY     APPENDECTOMY     BREAST LUMPECTOMY     COLON RESECTION     COLONOSCOPY  07/17/09   KWI:OXBDZ internal hemorrhoids/tortuous colon/3-mm sessile hepatic polyp/sigmoid colon diverticula, small internal hemorrhoids, path: tubular adenoma   COLONOSCOPY N/A 07/23/2012   HGD:JMEQAS polyp in the ascending colon/Two sessile polyps in the rectum/RECTAL BLEEDING DUE TO Large internal hemorrhoids-S/P BANDINGx3    There were no vitals filed for this visit.   Subjective Assessment - 09/28/20 1146     Subjective "I think I am here because I have trouble breathing."    Currently in Pain? No/denies                SLP Evaluation [facility] - 09/28/20 1146       SLP Visit Information   SLP Received On 09/28/20    Referring Provider (SLP) Ebbie Latus DO    Onset Date 08/27/2020      General Information   HPI Paula Carrillo is a 65  y.o. female with with history of Arthritis, colon cancer, and long standing COPD. She was referred by Dr. Fredric Dine for evaluation of hoarseness. Pt was recently evaluated at [facility] by Dr. Harvin Hazel and Dr. Mila Homer for the same, as well as for shortness of breath and chronic productive cough. She has had long-standing symptoms, primarily SOB and cough, for at least 5 years ago, which are gradually worsening at night and with exertion. Their report shows: <She reports she is able to inhale without issue but feels as if exhalation is difficult, helped with inhalers and steroids. She has not had any episodes of stridor. Her voice is not significantly bothersome for her and her exam is consistent with bilateral prominent Reinke's edema with expected changes as noted on Stroboscopy with no obvious leukoplakia or concerning masses. Visualized subglottis is patent. She continues to smoke currently. Given her symptomatology and after view of stroboscopy with SLP as well as continued smoking, she is unfortunately a suboptimal candidate for voice therapy.> She has an appointment for FNA of her thyroid tomorrow at [facility].    Behavioral/Cognition alert, cooperative, [facility]    Mobility Status ambulatory  Balance Screen   Has the patient fallen in the past 6 months No    Has the patient had a decrease in activity level because of a fear of falling?  No    Is the patient reluctant to leave their home because of a fear of falling?  No      Prior Functional Status   Cognitive/Linguistic Baseline Within functional limits    Vocation On disability      Cognition   Overall Cognitive Status Within Functional Limits for tasks assessed      Oral Motor/Sensory Function   Overall Oral Motor/Sensory Function Appears within functional limits for tasks assessed      Motor Speech   Overall Motor Speech Impaired    Respiration Impaired    Level of Impairment Sentence    Phonation Hoarse    Resonance Within  functional limits    Articulation Within functional limitis    Intelligibility Intelligible    Motor Planning Witnin functional limits    Interfering Components Hearing loss    Phonation Impaired    Vocal Abuses Habitual Cough/Throat Clear;Smoking;Vocal Fold Dehydration    Volume Appropriate    Pitch Low            The Voice Handicap Index-10 (VHI-10) was administered. This survey is a series of questions targeting the patient's perception of his/her own voice using a scale of 0-4 (0=Never, 4=Always). Score greater than 11 is abnormal. My voice makes it difficult for people to hear me. 2  People have difficulty understanding me in a noisy room. 2  My voice difficulties restrict personal and social life. 4 I feel left out of conversations because of my voice. 3  My voice problem causes me to lose income. 0  I feel as though I have to strain to produce voice. 2  The clarity of my voice is unpredictable. 2  My voice problem upsets me. 2  My voice makes me feel handicapped. 2  People ask, "What's wrong with your voice?" 3  TOTAL SCORE:  [x]  Abnormal (raw score >11) []  Normal 22/40     Reflux Symptom Index Hoarseness or a problem with your voice 4 Clearing your throat 5 Excess throat mucous or postnasal drip 5 Difficulty swallowing food, liquids, or pills 2 Coughing after you eat or after lying down 4 Breathing difficulties or choking episodes 4 Troublesome or annoying cough 5 Sensation of something sticking in your throat or a lump in your throat 5 Heartburn, chest pain, indigestion, or stomach acid coming up 5 Total RSI Score: 39  Normative data suggests that an RSI of greater than or equal to 13 is clinically significant  Therefore, an RSI > 13 may be indicative of significant reflux disease.  ENT evaluation with Dr. Posey Pronto on 08/27/2020:  <<She has not had any episodes of stridor. Her voice is not significantly bothersome for her and her exam is consistent with bilateral  prominent Reinke's edema with expected changes as noted on Stroboscopy with no obvious leukoplakia or concerning masses. Visualized subglottis is patent. She continues to smoke currently, and now is up to 2 packs per day. Given her symptomatology and after view of stroboscopy with SLP as well as continued smoking, she is unfortunately a suboptimal candidate for voice therapy.>>    SLP Education - 09/28/20 1202     Education Details Provided vocal hygiene information    Person(s) Educated Patient    Methods Explanation;Handout    Comprehension Verbalized understanding  SLP Short Term Goals - 09/28/20 1210       SLP SHORT TERM GOAL #1   Title N/A; evaluation only                Plan - 09/28/20 1208     Clinical Impression Statement Pt presents with hoarse vocal quality in setting of COPD, smoking, and vocal fold dehydration. Pt was only able to sustain /a/, /s/, and /z/ for an average of 3 seconds. Pt states she smokes between a half a pack of cigarettes to 2 packs a day. She recently decreased her coffee intake from 5 cups per day to one. She does not drink any water per her report (only Sprite and coffee). She takes prednisone, omeprazole (inconsistently), and inhalers. Pt presents with mildly hoarse vocal quality, but is judged to be 100% intelligible in a quiet environment. She is most bothered by "pain" and "sore" throat and occasionally difficulty swallowing chips. She will have FNA of her thyroid tomorrow and also follows Dr. Melvyn Novas for pulmonary needs. Pt is not an ideal candidate for voice therapy given her smoking. She was given written recommendations for vocal hygiene. She was advised to quit smoking, increase water intake, and adhere to reflux precautions with elevating the head of her bed, to help her voice and breathing most. She was given my contact information should she have further questions of concerns. Pt is in agreement with plan of care.    Consulted  and Agree with Plan of Care Patient             Patient will benefit from skilled therapeutic intervention in order to improve the following deficits and impairments:   Hoarseness    Problem List Patient Active Problem List   Diagnosis Date Noted   Upper airway cough syndrome 07/30/2020   Cigarette smoker 07/30/2020   COPD GOLD ? 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

## 2020-09-29 ENCOUNTER — Other Ambulatory Visit: Payer: Medicare Other

## 2020-09-29 ENCOUNTER — Ambulatory Visit: Payer: Medicare Other | Admitting: Internal Medicine

## 2020-09-29 NOTE — Progress Notes (Deleted)
Paula Carrillo, female    DOB: [birth year]     MRN: [id number]   Brief patient profile:  84 yowf active smoker says dx with copd around 2012 and maint on advair and spiriva and referred to pulmonary clinic in [facility]  07/29/2020 by Dr   Huel Cote with worsening sob/cough with globus on advair/spiriva dpi    07/13/20 ent eval: moderate interarytenoid edema and erythema consistent with laryngopharyngeal reflux. 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 Referred to [facility] voice center Thyroid nodule x 4 cm > thyroid US    History of Present Illness  07/29/2020  Pulmonary/ 1st office eval/ Melvyn Novas / [facility] Office  Chief Complaint  Patient presents with   Consult    Productive cough with white phlegm, sore throat "something growing in it" since March 2022  Dyspnea:  [employer] shopping but has to go to slow stops each aisle = MMRC3 = can't walk 100 yards even at a slow pace at a flat grade s stopping due to sob   Cough: sense of throat congestion/ globus  on advair/ spiriva  Sleep: < 30 degrees /bed is flat with pillows  SABA use: very confused re how/ when to use it 02  None  rec The key is to stop smoking completely before smoking completely stops you! Plan A = Automatic = Always=    spiriva 2 puffs each am and stop powdered advair and spiriva  Prednisone 10 mg take  4 each am x 2 days,   2 each am x 2 days,  1 each am x 2 days and stop  Plan B = Backup (to supplement plan A, not to replace it) Only use your albuterol inhaler as a rescue medicatio Plan C = Crisis (instead of Plan B but only if Plan B stops working) - only use your albuterol nebulizer if you first try Plan B and it fails to help > ok to use the nebulizer up to every 4 hours but if start needing it regularly call for immediate appointment GERD  diet  Please schedule a follow up office visit in 2 weeks, call sooner if needed with all medications /inhalers/ solutions  in hand so we can verify exactly what you are taking. 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  Pantoprazole (protonix) 40 mg   Take  30-60 min before first meal of the day and Pepcid (famotidine)  20 mg after supper  And stop oil based vitamin GERD diet/ bed blocks  Continue spiriva 2.5  2 pffs first thing in am For cough > mucinex dm 1200 mg every every hours as needed  Prednisone 10 mg 2 daily until breathing then 1 daily take with breakfast until prednisone  Please remember to go to the lab department @ [facility] for your tests - we will call you with the results when they are available. Please schedule a follow up office  visit in 6-8  weeks with PFTs on return    09/29/2020  f/u ov/[place] office/Nitya Cauthon re: probable copd/cb - did not go to lab needs allergy screen/ alpha one phenotype  No chief complaint on file.   Dyspnea:  *** Cough: *** Sleeping: *** SABA use: *** 02: *** Covid status: *** Lung cancer screening: ***   No obvious day to day or daytime variability or assoc excess/ purulent sputum or mucus plugs or hemoptysis or cp or chest tightness, subjective wheeze or overt sinus or hb symptoms.   *** without nocturnal  or early am exacerbation  of respiratory  c/o's or need for noct saba. Also denies any obvious fluctuation of symptoms with weather or environmental changes or other aggravating or alleviating factors except as outlined above   No unusual exposure hx or h/o childhood pna/ asthma or  knowledge of premature birth.  Current Allergies, Complete Past Medical History, Past Surgical History, Family History, and Social History were reviewed in [employer] record.  ROS  The following are not active complaints unless bolded Hoarseness, sore throat, dysphagia, dental problems, itching, sneezing,  nasal congestion or discharge of excess mucus or purulent secretions, ear ache,   fever, chills, sweats, unintended wt loss or wt gain, classically pleuritic or exertional cp,  orthopnea pnd or arm/hand swelling  or leg swelling, presyncope, palpitations, abdominal pain, anorexia, nausea, vomiting, diarrhea  or change in bowel habits or change in bladder habits, change in stools or change in urine, dysuria, hematuria,  rash, arthralgias, visual complaints, headache, numbness, weakness or ataxia or problems with walking or coordination,  change in mood or  memory.        No outpatient medications have been marked as taking for the 09/29/20 encounter (Appointment) with Tanda Rockers, MD.                        Past Medical History:  Diagnosis Date   Asthma    Cancer [facility])    Chronic abdominal pain    Chronic back pain    Chronic neck pain    Chronic pain    Colon cancer (HCC)    COPD (chronic obstructive pulmonary disease) (HCC)    Hemorrhoids    IBS (irritable bowel syndrome)    Migraines        Objective:     09/29/2020       ***   07/29/20 140 lb 12.8 oz (63.9 kg)  03/31/15 165 lb 9.6 oz (75.1 kg)  08/11/14 170 lb (77.1 kg)    Vital signs reviewed  09/29/2020  - Note at rest 02 sats  ***% on ***   General appearance:    ***       LUNGS: no acc muscle use,  Mod bar ***.            Labs ordered 08/11/2020   :  allergy profile   alpha one AT phenotype        Assessment

## 2020-09-30 ENCOUNTER — Other Ambulatory Visit (HOSPITAL_COMMUNITY): Payer: Self-pay | Admitting: Otolaryngology

## 2020-09-30 DIAGNOSIS — E041 Nontoxic single thyroid nodule: Secondary | ICD-10-CM

## 2020-10-08 ENCOUNTER — Ambulatory Visit (HOSPITAL_COMMUNITY)
Admission: RE | Admit: 2020-10-08 | Discharge: 2020-10-08 | Disposition: A | Discharge status: Home / Self Care | Payer: Medicare Other | Source: Ambulatory Visit | Attending: Otolaryngology | Admitting: Otolaryngology

## 2020-10-08 ENCOUNTER — Other Ambulatory Visit: Payer: Self-pay

## 2020-10-08 ENCOUNTER — Encounter (HOSPITAL_COMMUNITY): Payer: Self-pay

## 2020-10-08 DIAGNOSIS — E041 Nontoxic single thyroid nodule: Secondary | ICD-10-CM

## 2020-10-08 DIAGNOSIS — D440 Neoplasm of uncertain behavior of thyroid gland: Secondary | ICD-10-CM | POA: Insufficient documentation

## 2020-10-08 MED ORDER — LIDOCAINE HCL 2 % IJ SOLN
INTRAMUSCULAR | Status: AC
  Filled 2020-10-08: qty 10

## 2020-10-12 LAB — CYTOLOGY - NON PAP

## 2020-10-26 ENCOUNTER — Other Ambulatory Visit: Payer: Self-pay

## 2020-10-26 ENCOUNTER — Emergency Department (HOSPITAL_COMMUNITY): Payer: Medicare Other

## 2020-10-26 ENCOUNTER — Emergency Department (HOSPITAL_COMMUNITY)
Admission: EM | Admit: 2020-10-26 | Discharge: 2020-10-26 | Disposition: A | Discharge status: Home / Self Care | Payer: Medicare Other | Attending: Emergency Medicine | Admitting: Emergency Medicine

## 2020-10-26 ENCOUNTER — Encounter (HOSPITAL_COMMUNITY): Payer: Self-pay | Admitting: Emergency Medicine

## 2020-10-26 DIAGNOSIS — J45909 Unspecified asthma, uncomplicated: Secondary | ICD-10-CM | POA: Insufficient documentation

## 2020-10-26 DIAGNOSIS — Z87891 Personal history of nicotine dependence: Secondary | ICD-10-CM | POA: Diagnosis not present

## 2020-10-26 DIAGNOSIS — K219 Gastro-esophageal reflux disease without esophagitis: Secondary | ICD-10-CM | POA: Insufficient documentation

## 2020-10-26 DIAGNOSIS — Z7951 Long term (current) use of inhaled steroids: Secondary | ICD-10-CM | POA: Diagnosis not present

## 2020-10-26 DIAGNOSIS — J449 Chronic obstructive pulmonary disease, unspecified: Secondary | ICD-10-CM | POA: Diagnosis not present

## 2020-10-26 DIAGNOSIS — K8020 Calculus of gallbladder without cholecystitis without obstruction: Secondary | ICD-10-CM | POA: Diagnosis not present

## 2020-10-26 DIAGNOSIS — Z85038 Personal history of other malignant neoplasm of large intestine: Secondary | ICD-10-CM | POA: Insufficient documentation

## 2020-10-26 DIAGNOSIS — R1084 Generalized abdominal pain: Secondary | ICD-10-CM | POA: Diagnosis present

## 2020-10-26 DIAGNOSIS — R52 Pain, unspecified: Secondary | ICD-10-CM

## 2020-10-26 DIAGNOSIS — R079 Chest pain, unspecified: Secondary | ICD-10-CM

## 2020-10-26 HISTORY — DX: Disorder of thyroid, unspecified: E07.9

## 2020-10-26 LAB — CBC WITH DIFFERENTIAL/PLATELET
Abs Immature Granulocytes: 0.11 10*3/uL — ABNORMAL HIGH (ref 0.00–0.07)
Basophils Absolute: 0.1 10*3/uL (ref 0.0–0.1)
Basophils Relative: 1 %
Eosinophils Absolute: 0.6 10*3/uL — ABNORMAL HIGH (ref 0.0–0.5)
Eosinophils Relative: 4 %
HCT: 42.7 % (ref 36.0–46.0)
Hemoglobin: 14 g/dL (ref 12.0–15.0)
Immature Granulocytes: 1 %
Lymphocytes Relative: 24 %
Lymphs Abs: 3.3 10*3/uL (ref 0.7–4.0)
MCH: 31.2 pg (ref 26.0–34.0)
MCHC: 32.8 g/dL (ref 30.0–36.0)
MCV: 95.1 fL (ref 80.0–100.0)
Monocytes Absolute: 1.2 10*3/uL — ABNORMAL HIGH (ref 0.1–1.0)
Monocytes Relative: 9 %
Neutro Abs: 8.3 10*3/uL — ABNORMAL HIGH (ref 1.7–7.7)
Neutrophils Relative %: 61 %
Platelets: 330 10*3/uL (ref 150–400)
RBC: 4.49 MIL/uL (ref 3.87–5.11)
RDW: 13 % (ref 11.5–15.5)
WBC: 13.7 10*3/uL — ABNORMAL HIGH (ref 4.0–10.5)
nRBC: 0 % (ref 0.0–0.2)

## 2020-10-26 LAB — COMPREHENSIVE METABOLIC PANEL
ALT: 14 U/L (ref 0–44)
AST: 15 U/L (ref 15–41)
Albumin: 3.6 g/dL (ref 3.5–5.0)
Alkaline Phosphatase: 47 U/L (ref 38–126)
Anion gap: 7 (ref 5–15)
BUN: 8 mg/dL (ref 8–23)
CO2: 30 mmol/L (ref 22–32)
Calcium: 9.3 mg/dL (ref 8.9–10.3)
Chloride: 103 mmol/L (ref 98–111)
Creatinine, Ser: 0.63 mg/dL (ref 0.44–1.00)
GFR, Estimated: >60 mL/min (ref >60)
Glucose, Bld: 91 mg/dL (ref 70–99)
Potassium: 3.5 mmol/L (ref 3.5–5.1)
Sodium: 140 mmol/L (ref 135–145)
Total Bilirubin: 0.6 mg/dL (ref 0.3–1.2)
Total Protein: 6.3 g/dL — ABNORMAL LOW (ref 6.5–8.1)

## 2020-10-26 LAB — TROPONIN I (HIGH SENSITIVITY)
Troponin I (High Sensitivity): 4 ng/L (ref <18)
Troponin I (High Sensitivity): 3 ng/L (ref <18)

## 2020-10-26 LAB — LIPASE, BLOOD: Lipase: 34 U/L (ref 11–51)

## 2020-10-26 MED ORDER — PANTOPRAZOLE SODIUM 40 MG IV SOLR
40.0000 mg | Freq: Once | INTRAVENOUS | Status: AC
  Administered 2020-10-26: 40 mg via INTRAVENOUS
  Filled 2020-10-26: qty 40

## 2020-10-26 MED ORDER — IOHEXOL 300 MG/ML  SOLN
100.0000 mL | Freq: Once | INTRAMUSCULAR | Status: AC | PRN
  Administered 2020-10-26: 80 mL via INTRAVENOUS

## 2020-10-26 MED ORDER — ONDANSETRON HCL 4 MG/2ML IJ SOLN
4.0000 mg | Freq: Once | INTRAMUSCULAR | Status: AC
  Administered 2020-10-26: 4 mg via INTRAVENOUS
  Filled 2020-10-26: qty 2

## 2020-10-26 MED ORDER — ONDANSETRON 4 MG PO TBDP: ORAL_TABLET | ORAL | 0 refills | Status: DC

## 2020-10-26 MED ORDER — HYDROCODONE-ACETAMINOPHEN 5-325 MG PO TABS: 1.0000 | ORAL_TABLET | Freq: Four times a day (QID) | ORAL | 0 refills | Status: DC | PRN

## 2020-10-26 MED ORDER — HYDROMORPHONE HCL 1 MG/ML IJ SOLN
0.5000 mg | Freq: Once | INTRAMUSCULAR | Status: AC
  Administered 2020-10-26: 0.5 mg via INTRAVENOUS
  Filled 2020-10-26: qty 1

## 2020-10-26 NOTE — ED Provider Notes (Signed)
[facility] EMERGENCY DEPARTMENT Provider Note   CSN: [id number] Arrival date & time: 10/26/20  0746     History Chief Complaint  Patient presents with   Chest Pain    Paula Carrillo is a 65 y.o. female.  Patient with epigastric and right upper quadrant abdominal pain  The history is provided by the patient and medical records. No language interpreter was used.  Abdominal Pain Pain location:  Generalized Pain quality: aching   Pain radiates to:  Does not radiate Pain severity:  Moderate Onset quality:  Sudden Timing:  Constant Progression:  Waxing and waning Chronicity:  New Context: not alcohol use   Relieved by:  Nothing Associated symptoms: no chest pain, no cough, no diarrhea, no fatigue and no hematuria       Past Medical History:  Diagnosis Date   Asthma    Cancer ([place])    Chronic abdominal pain    Chronic back pain    Chronic neck pain    Chronic pain    Colon cancer (HCC)    COPD (chronic obstructive pulmonary disease) (HCC)    Hemorrhoids    IBS (irritable bowel syndrome)    Migraines    Thyroid disease     Patient Active Problem List   Diagnosis Date Noted   Upper airway cough syndrome 07/30/2020   Cigarette smoker 07/30/2020   COPD GOLD ? 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   Colon adenoma 08/15/2012   GERD (gastroesophageal reflux disease) 08/15/2012   Chest pain 05/24/2011   Musculoskeletal pain 05/24/2011   Bradycardia 05/24/2011   Asthma 05/24/2011   COLON CANCER, PERSONAL HX 07/12/2009   RECTAL BLEEDING 07/08/2009   Abdominal pain 07/08/2009    Past Surgical History:  Procedure Laterality Date   ABDOMINAL HYSTERECTOMY     APPENDECTOMY     BREAST LUMPECTOMY     COLON RESECTION     COLONOSCOPY  07/17/09   [id number] internal hemorrhoids/tortuous colon/3-mm sessile hepatic polyp/sigmoid colon diverticula, small internal hemorrhoids, path: tubular  adenoma   COLONOSCOPY N/A 07/23/2012   [id number] polyp in the ascending colon/Two sessile polyps in the rectum/RECTAL BLEEDING DUE TO Large internal hemorrhoids-S/P BANDINGx3     OB History   No obstetric history on file.     Family History  Problem Relation Age of Onset   Colon cancer Neg Hx    Hypertension Mother    CVA Mother    Parkinson's disease Mother    Emphysema Father     Social History   Tobacco Use   Smoking status: Every Day   Smokeless tobacco: Former    Quit date: 05/09/1974   Tobacco comments:    smokes 1 pack per day 07/29/20  Vaping Use   Vaping Use: Never used  Substance Use Topics   Alcohol use: No    Alcohol/week: 0.0 standard drinks   Drug use: No    Home Medications Prior to Admission medications   Medication Sig Start Date End Date Taking? Authorizing Provider  HYDROcodone-acetaminophen (NORCO/VICODIN) 5-325 MG tablet Take 1 tablet by mouth every 6 (six) hours as needed for moderate pain. 10/26/20  Yes Milton Ferguson, MD  ondansetron (ZOFRAN ODT) 4 MG disintegrating tablet '4mg'$  ODT q4 hours prn nausea/vomit 10/26/20  Yes Milton Ferguson, MD  albuterol (PROVENTIL HFA;VENTOLIN HFA) 108 (90 BASE) MCG/ACT inhaler Inhale 2 puffs into the lungs every 6 (six) hours as needed. Shortness of  breath    [provider]  albuterol (PROVENTIL) (2.5 MG/3ML) 0.083% nebulizer solution Take 3 mLs (2.5 mg total) by nebulization every 4 (four) hours as needed for wheezing or shortness of breath. 08/11/20   Tanda Rockers, MD  Calcium Carb-Cholecalciferol (SUPER CALCIUM 600 + D 400 PO) Take 1 tablet by mouth daily.    [provider]  famotidine (PEPCID) 20 MG tablet One after supper 08/11/20   Tanda Rockers, MD  hydrOXYzine (ATARAX/VISTARIL) 25 MG tablet Take 25 mg by mouth 3 (three) times daily as needed.    [provider]  montelukast (SINGULAIR) 10 MG tablet Take 10 mg by mouth at bedtime.    [provider]  naproxen sodium (ALEVE)  220 MG tablet Take 220 mg by mouth 2 (two) times daily as needed.    [provider]  pantoprazole (PROTONIX) 40 MG tablet Take 1 tablet (40 mg total) by mouth daily. Take 30-60 min before first meal of the day 08/11/20   Tanda Rockers, MD  predniSONE (DELTASONE) 10 MG tablet 2 daily until better then 1 daily 09/08/20   Tanda Rockers, MD  Tiotropium Bromide Monohydrate (SPIRIVA RESPIMAT) 2.5 MCG/ACT AERS 2 puffs each am 07/29/20   Tanda Rockers, MD  vitamin B-12 (CYANOCOBALAMIN) 500 MCG tablet Take 500 mcg by mouth daily.    [provider]    Allergies    Sulfa antibiotics  Review of Systems   Review of Systems  Constitutional:  Negative for appetite change and fatigue.  HENT:  Negative for congestion, ear discharge and sinus pressure.   Eyes:  Negative for discharge.  Respiratory:  Negative for cough.   Cardiovascular:  Negative for chest pain.  Gastrointestinal:  Positive for abdominal pain. Negative for diarrhea.  Genitourinary:  Negative for frequency and hematuria.  Musculoskeletal:  Negative for back pain.  Skin:  Negative for rash.  Neurological:  Negative for seizures and headaches.  Psychiatric/Behavioral:  Negative for hallucinations.    Physical Exam Updated Vital Signs BP 118/74   Pulse (!) 51   Temp 98.3 F (36.8 C) (Oral)   Resp 14   Ht '5\' 4"'$  (1.626 m)   Wt 63.5 kg   SpO2 92%   BMI 24.03 kg/m   Physical Exam Vitals and nursing note reviewed.  Constitutional:      Appearance: She is well-developed.  HENT:     Head: Normocephalic.     Nose: Nose normal.  Eyes:     General: No scleral icterus.    Conjunctiva/sclera: Conjunctivae normal.  Neck:     Thyroid: No thyromegaly.  Cardiovascular:     Rate and Rhythm: Normal rate and regular rhythm.     Heart sounds: No murmur heard.   No friction rub. No gallop.  Pulmonary:     Breath sounds: No stridor. No wheezing or rales.  Chest:     Chest wall: No tenderness.  Abdominal:      General: There is no distension.     Tenderness: There is abdominal tenderness. There is no rebound.  Musculoskeletal:        General: Normal range of motion.     Cervical back: Neck supple.  Lymphadenopathy:     Cervical: No cervical adenopathy.  Skin:    Findings: No erythema or rash.  Neurological:     Mental Status: She is alert and oriented to person, place, and time.     Motor: No abnormal muscle tone.  Coordination: Coordination normal.  Psychiatric:        Behavior: Behavior normal.    ED Results / Procedures / Treatments   Labs (all labs ordered are listed, but only abnormal results are displayed) Labs Reviewed  CBC WITH DIFFERENTIAL/PLATELET - Abnormal; Notable for the following components:      Result Value   WBC 13.7 (*)    Neutro Abs 8.3 (*)    Monocytes Absolute 1.2 (*)    Eosinophils Absolute 0.6 (*)    Abs Immature Granulocytes 0.11 (*)    All other components within normal limits  COMPREHENSIVE METABOLIC PANEL - Abnormal; Notable for the following components:   Total Protein 6.3 (*)    All other components within normal limits  LIPASE, BLOOD  TROPONIN I (HIGH SENSITIVITY)  TROPONIN I (HIGH SENSITIVITY)    EKG None  Radiology DG Chest 1 View  Result Date: 10/26/2020 CLINICAL DATA:  Chest pain, sore throat, cough and headache. EXAM: CHEST  1 VIEW COMPARISON:  08/05/2020. FINDINGS: Trachea is midline. Heart size normal. Lungs are clear. No pleural fluid. IMPRESSION: No acute findings. Electronically Signed   By: Lorin Picket M.D.   On: 10/26/2020 10:11   CT ABDOMEN PELVIS W CONTRAST  Result Date: 10/26/2020 CLINICAL DATA:  65 year old female with abdominal pain onset this morning. EXAM: CT ABDOMEN AND PELVIS WITH CONTRAST TECHNIQUE: Multidetector CT imaging of the abdomen and pelvis was performed using the standard protocol following bolus administration of intravenous contrast. CONTRAST:  54m OMNIPAQUE IOHEXOL 300 MG/ML  SOLN COMPARISON:  [facility] (no images available). FINDINGS: Lower chest: Mild lung base atelectasis, right lower lobe lateral basal segment scarring. No pericardial or pleural effusion. Hepatobiliary: Extensive cholelithiasis. Round and angular gallstones individually about 8 mm diameter. No pericholecystic inflammation. No bile duct enlargement. Evidence of hepatic steatosis but otherwise normal liver enhancement. Pancreas: Negative; main pancreatic duct is at the upper limits of normal. Spleen: Negative; splenule, normal variant. Adrenals/Urinary Tract: Normal adrenal glands. Symmetric renal enhancement and contrast excretion. Small benign right renal mid and lower pole cysts. No nephrolithiasis. No perinephric stranding. Decompressed ureters. Unremarkable bladder. Stomach/Bowel: There is an anastomosis in the distal descending colon on series 2, image 52 with no adverse features. Decompressed rectosigmoid. Decompressed upstream descending colon. Redundant but otherwise negative transverse colon. Decompressed right colon. Cecum partially located in the pelvis. Evidence of prior appendectomy. No large bowel inflammation. Terminal ileum and distal small bowel loops appear decompressed and within normal limits. No dilated small bowel. Decompressed stomach and duodenum. No free air, free fluid. No mesenteric inflammation. There is a very small 2 cm upper abdominal ventral hernia containing some of the transverse mesocolon and vessels on series 2, image 33, located just to the right of midline. Vascular/Lymphatic: Aortoiliac calcified atherosclerosis. Major arterial structures are patent. Portal venous system appears to be patent. No lymphadenopathy. Reproductive: Small 15 mm right fundal fibroid. Left adnexal region surgical clips, the left ovary might be absent. Otherwise negative. Other: No pelvic free fluid. Musculoskeletal: Mild degenerative appearing retrolisthesis in the mid lumbar  spine with disc and endplate degeneration. No acute osseous abnormality identified. IMPRESSION: 1. No acute or inflammatory process identified in the abdomen or pelvis. 2. Cholelithiasis without CT evidence of acute cholecystitis. Hepatic steatosis. 3. Previous appendectomy and distal descending colon anastomosis. Tiny 2 cm ventral upper abdominal hernia containing some of the transverse mesocolon and vessels. 4. Small uterine fibroid. 5. Aortic Atherosclerosis (ICD10-I70.0). Electronically Signed  By: Genevie Ann M.D.   On: 10/26/2020 11:28   US Abdomen Limited RUQ (LIVER/GB)  Result Date: 10/26/2020 CLINICAL DATA:  Right upper quadrant pain. EXAM: ULTRASOUND ABDOMEN LIMITED RIGHT UPPER QUADRANT COMPARISON:  None. FINDINGS: Gallbladder: Multiple shadowing stones in the gallbladder measure up to 8 mm. Scattered ring down artifact. No wall thickening or pericholecystic fluid. Negative sonographic Murphy sign. Common bile duct: Diameter: 4 mm, within normal limits. Liver: Slightly increased in echogenicity. No focal lesion. Portal vein is patent on color Doppler imaging with normal direction of blood flow towards the liver. Other: None. IMPRESSION: 1. Cholelithiasis without acute cholecystitis. 2. Slight hepatic steatosis. Electronically Signed   By: Lorin Picket M.D.   On: 10/26/2020 10:27    Procedures Procedures   Medications Ordered in ED Medications  pantoprazole (PROTONIX) injection 40 mg (40 mg Intravenous Given 10/26/20 0914)  ondansetron (ZOFRAN) injection 4 mg (4 mg Intravenous Given 10/26/20 0914)  HYDROmorphone (DILAUDID) injection 0.5 mg (0.5 mg Intravenous Given 10/26/20 0914)  iohexol (OMNIPAQUE) 300 MG/ML solution 100 mL (80 mLs Intravenous Contrast Given 10/26/20 1114)    ED Course  I have reviewed the triage vital signs and the nursing notes.  Pertinent labs & imaging results that were available during my care of the patient were reviewed by me and considered in my medical decision  making (see chart for details).    MDM Rules/Calculators/A&P                           Patient with gallstones but no cholecystitis.  I spoke to the general surgeon and she will follow-up in 1 to 2 weeks and is given pain medicine and nausea medicine and told to return if she gets worse Final Clinical Impression(s) / ED Diagnoses Final diagnoses:  Pain  Gall stones    Rx / DC Orders ED Discharge Orders          Ordered    HYDROcodone-acetaminophen (NORCO/VICODIN) 5-325 MG tablet  Every 6 hours PRN        10/26/20 1229    ondansetron (ZOFRAN ODT) 4 MG disintegrating tablet        10/26/20 1229             Milton Ferguson, MD 10/28/20 [phone]

## 2020-10-26 NOTE — ED Triage Notes (Signed)
Pt c/o cp, sore throat, and headache that started around 530 this morning.

## 2020-10-26 NOTE — Discharge Instructions (Addendum)
Call Dr. Constance Haw office later today and make  an appointment for this week or next week.  Return if any problems.

## 2020-10-29 ENCOUNTER — Other Ambulatory Visit: Payer: Self-pay

## 2020-10-29 ENCOUNTER — Ambulatory Visit (INDEPENDENT_AMBULATORY_CARE_PROVIDER_SITE_OTHER): Payer: Medicare Other | Admitting: General Surgery

## 2020-10-29 ENCOUNTER — Encounter: Payer: Self-pay | Admitting: General Surgery

## 2020-10-29 VITALS — BP 139/82 | HR 69 | Temp 99.1°F | Resp 16 | Ht 64.0 in | Wt 139.0 lb

## 2020-10-29 DIAGNOSIS — K8020 Calculus of gallbladder without cholecystitis without obstruction: Secondary | ICD-10-CM | POA: Diagnosis not present

## 2020-10-29 NOTE — Patient Instructions (Signed)
I will get in touch with Dr. Melvyn Novas.   I am afraid with your windpipe (Trachea/ vocal cord) swelling and your lung history that you may need surgery at another location.  Will discuss with him and Anesthesia.

## 2020-10-30 ENCOUNTER — Other Ambulatory Visit (HOSPITAL_COMMUNITY)
Admission: RE | Admit: 2020-10-30 | Discharge: 2020-10-30 | Disposition: A | Discharge status: Home / Self Care | Payer: Medicare Other | Source: Ambulatory Visit | Attending: Internal Medicine | Admitting: Internal Medicine

## 2020-10-30 ENCOUNTER — Telehealth: Payer: Self-pay | Admitting: Internal Medicine

## 2020-10-30 DIAGNOSIS — Z20822 Contact with and (suspected) exposure to covid-19: Secondary | ICD-10-CM | POA: Insufficient documentation

## 2020-10-30 DIAGNOSIS — Z01812 Encounter for preprocedural laboratory examination: Secondary | ICD-10-CM | POA: Insufficient documentation

## 2020-10-30 LAB — SARS CORONAVIRUS 2 (TAT 6-24 HRS): SARS Coronavirus 2: NEGATIVE

## 2020-10-30 NOTE — Telephone Encounter (Signed)
Called and spoke with patient to let her know that we should be able to clear her for surgery at her upcoming appointment on 11/12/20 but at this time we do not have anything sooner. She states that she supposed to do PFT on 11/03/20. Advised her to keep that appointment as we may not be able to clear her without it. She expressed understanding. Appointment notes have been updated. Nothing further needed at this time.

## 2020-10-30 NOTE — Progress Notes (Signed)
[facility] History and Physical  Reason for Referral: Gallstones  Referring Physician:  ED   Chief Complaint   New Patient (Initial Visit)     Paula Carrillo is a 65 y.o. female.  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 Melvyn Novas, prior colon cancer who has been having some upper epigastric pain and some reflux and worsening pain with pain onto the right side when Paula Carrillo came to the Ed 2 days ago. Paula Carrillo was worked up and noted to have gallstones but no signs of cholecystitis. Paula Carrillo says Paula Carrillo has been having some discomfort off and on for a while but had been told it was GERD.  Paula Carrillo says that Paula Carrillo does not really eat greasy foods and that nothing triggered this episode. Paula Carrillo was referred to discuss possible cholecystectomy by the ED. Paula Carrillo tells me Paula Carrillo was just recently diagnosed with thyroid cancer and is getting set up for this cancer surgery.  In looking through Paula Carrillo chart it looks like Paula Carrillo actually is being followed by Dr. Ebbie Latus, DO ENT who is scheduling Paula Carrillo for surgery for this and saw Paula Carrillo 10/16/2020. Dr. Melvyn Novas has been following Paula Carrillo in the past and has PFTs ordered for next week and follow up in the early part of September.  Past Medical History:  Diagnosis Date   Asthma    Cancer ([place])    Chronic abdominal pain    Chronic back pain    Chronic neck pain    Chronic pain    Colon cancer (HCC)    COPD (chronic obstructive pulmonary disease) (HCC)    Hemorrhoids    IBS (irritable bowel syndrome)    Migraines    Thyroid disease     Past Surgical History:  Procedure Laterality Date   ABDOMINAL HYSTERECTOMY     APPENDECTOMY     BREAST LUMPECTOMY     COLON RESECTION     COLONOSCOPY  07/17/09   [id number] internal hemorrhoids/tortuous colon/3-mm sessile hepatic polyp/sigmoid colon diverticula, small internal hemorrhoids, path: tubular adenoma   COLONOSCOPY N/A  07/23/2012   [id number] polyp in the ascending colon/Two sessile polyps in the rectum/RECTAL BLEEDING DUE TO Large internal hemorrhoids-S/P BANDINGx3    Family History  Problem Relation Age of Onset   Colon cancer Neg Hx    Hypertension Mother    CVA Mother    Parkinson's disease Mother    Emphysema Father     Social History   Tobacco Use   Smoking status: Every Day   Smokeless tobacco: Former    Quit date: 05/09/1974   Tobacco comments:    smokes 1 pack per day 07/29/20  Vaping Use   Vaping Use: Never used  Substance Use Topics   Alcohol use: No    Alcohol/week: 0.0 standard drinks   Drug use: No    Medications: I have reviewed the patient's current medications. Prior to Admission: (Not in a hospital admission)  Scheduled: Continuous: PRN: Allergies as of 10/29/2020       Reactions   Sulfa Antibiotics Hives, Itching        Medication List        Accurate as of October 29, 2020 11:59 PM. If you have any questions, ask your nurse or doctor.          albuterol 108 (90 Base) MCG/ACT inhaler Commonly known as: VENTOLIN HFA Inhale 2 puffs into the  lungs every 6 (six) hours as needed. Shortness of breath   albuterol (2.5 MG/3ML) 0.083% nebulizer solution Commonly known as: PROVENTIL Take 3 mLs (2.5 mg total) by nebulization every 4 (four) hours as needed for wheezing or shortness of breath.   famotidine 20 MG tablet Commonly known as: Pepcid One after supper   HYDROcodone-acetaminophen 5-325 MG tablet Commonly known as: NORCO/VICODIN Take 1 tablet by mouth every 6 (six) hours as needed for moderate pain.   hydrOXYzine 25 MG tablet Commonly known as: ATARAX/VISTARIL Take 25 mg by mouth 3 (three) times daily as needed.   montelukast 10 MG tablet Commonly known as: SINGULAIR Take 10 mg by mouth at bedtime.   naproxen sodium 220 MG tablet Commonly known as: ALEVE Take 220 mg by mouth 2 (two) times daily as needed.   ondansetron 4 MG disintegrating  tablet Commonly known as: Zofran ODT '4mg'$  ODT q4 hours prn nausea/vomit   pantoprazole 40 MG tablet Commonly known as: Protonix Take 1 tablet (40 mg total) by mouth daily. Take 30-60 min before first meal of the day   predniSONE 10 MG tablet Commonly known as: DELTASONE 2 daily until better then 1 daily   Spiriva Respimat 2.5 MCG/ACT Aers Generic drug: Tiotropium Bromide Monohydrate 2 puffs each am   SUPER CALCIUM 600 + D 400 PO Take 1 tablet by mouth daily.   vitamin B-12 500 MCG tablet Commonly known as: CYANOCOBALAMIN Take 500 mcg by mouth daily.         ROS:  A comprehensive review of systems was negative except for: Respiratory: positive for cough, wheezing, and SOB Gastrointestinal: positive for abdominal pain, nausea, and reflux symptoms Genitourinary: positive for frequency Musculoskeletal: positive for back pain, neck pain, and joint pain Endocrine: positive for thyroid cancer   Blood pressure 139/82, pulse 69, temperature 99.1 F (37.3 C), temperature source Other (Comment), resp. rate 16, height '5\' 4"'$  (1.626 m), weight 139 lb (63 kg), SpO2 95 %. Physical Exam Vitals reviewed.  Constitutional:      Appearance: Paula Carrillo is normal weight.  HENT:     Head: Normocephalic.     Comments: Raspy voice     Nose: Nose normal.     Mouth/Throat:     Mouth: Mucous membranes are moist.  Eyes:     Extraocular Movements: Extraocular movements intact.  Cardiovascular:     Rate and Rhythm: Normal rate and regular rhythm.  Pulmonary:     Effort: Pulmonary effort is normal.     Breath sounds: Wheezing present.  Abdominal:     General: There is no distension.     Palpations: Abdomen is soft.     Tenderness: There is abdominal tenderness.  Musculoskeletal:        General: Normal range of motion.  Skin:    General: Skin is warm.  Neurological:     General: No focal deficit present.     Mental Status: Paula Carrillo is alert and oriented to person, place, and time.  Psychiatric:         Mood and Affect: Mood normal.        Behavior: Behavior normal.        Thought Content: Thought content normal.    Results: Personally reviewed- gallbladder with multiple stones CLINICAL DATA:  Right upper quadrant pain.   EXAM: ULTRASOUND ABDOMEN LIMITED RIGHT UPPER QUADRANT   COMPARISON:  None.   FINDINGS: Gallbladder:   Multiple shadowing stones in the gallbladder measure up to 8 mm. Scattered ring down artifact.  No wall thickening or pericholecystic fluid. Negative sonographic Murphy sign.   Common bile duct:   Diameter: 4 mm, within normal limits.   Liver:   Slightly increased in echogenicity. No focal lesion. Portal vein is patent on color Doppler imaging with normal direction of blood flow towards the liver.   Other: None.   IMPRESSION: 1. Cholelithiasis without acute cholecystitis. 2. Slight hepatic steatosis.     Electronically Signed   By: Lorin Picket M.D.   On: 10/26/2020 10:27  CLINICAL DATA:  65 year old female with abdominal pain onset this morning.   EXAM: CT ABDOMEN AND PELVIS WITH CONTRAST   TECHNIQUE: Multidetector CT imaging of the abdomen and pelvis was performed using the standard protocol following bolus administration of intravenous contrast.   CONTRAST:  13m OMNIPAQUE IOHEXOL 300 MG/ML  SOLN   [facility]  [facility]no images available).   FINDINGS: Lower chest: Mild lung base atelectasis, right lower lobe lateral basal segment scarring. No pericardial or pleural effusion.   Hepatobiliary: Extensive cholelithiasis. Round and angular gallstones individually about 8 mm diameter. No pericholecystic inflammation. No bile duct enlargement. Evidence of hepatic steatosis but otherwise normal liver enhancement.   Pancreas: Negative; main pancreatic duct is at the upper limits of normal.   Spleen: Negative; splenule, normal variant.   Adrenals/Urinary Tract: Normal  adrenal glands. Symmetric renal enhancement and contrast excretion. Small benign right renal mid and lower pole cysts. No nephrolithiasis. No perinephric stranding. Decompressed ureters. Unremarkable bladder.   Stomach/Bowel: There is an anastomosis in the distal descending colon on series 2, image 52 with no adverse features. Decompressed rectosigmoid. Decompressed upstream descending colon. Redundant but otherwise negative transverse colon. Decompressed right colon. Cecum partially located in the pelvis. Evidence of prior appendectomy. No large bowel inflammation. Terminal ileum and distal small bowel loops appear decompressed and within normal limits. No dilated small bowel. Decompressed stomach and duodenum.   No free air, free fluid. No mesenteric inflammation. There is a very small 2 cm upper abdominal ventral hernia containing some of the transverse mesocolon and vessels on series 2, image 33, located just to the right of midline.   Vascular/Lymphatic: Aortoiliac calcified atherosclerosis. Major arterial structures are patent. Portal venous system appears to be patent. No lymphadenopathy.   Reproductive: Small 15 mm right fundal fibroid. Left adnexal region surgical clips, the left ovary might be absent. Otherwise negative.   Other: No pelvic free fluid.   Musculoskeletal: Mild degenerative appearing retrolisthesis in the mid lumbar spine with disc and endplate degeneration. No acute osseous abnormality identified.   IMPRESSION: 1. No acute or inflammatory process identified in the abdomen or pelvis. 2. Cholelithiasis without CT evidence of acute cholecystitis. Hepatic steatosis. 3. Previous appendectomy and distal descending colon anastomosis. Tiny 2 cm ventral upper abdominal hernia containing some of the transverse mesocolon and vessels. 4. Small uterine fibroid. 5. Aortic Atherosclerosis (ICD10-I70.0).     Electronically Signed   By: HGenevie AnnM.D.   On:  10/26/2020 11:28  Assessment & Plan:  Paula KRELLERis a 65y.o. female with gallstones and no cholecystitis. Paula Carrillo has multiple other issues going on currently including needing to have surgery for thyroid cancer. Paula Carr[facility]  Paula Carrillo is having some gallbladder type but no signs of cholecystitis. Paula Carrillo also has concern for edematous vocal cord/ Reinke's edema.  Paula Carrillo is getting PFTs next week.   -Discussed  with Paula Carrillo and Paula Carrillo family that Paula Carrillo has a lot of stuff going on. Discussed that Paula Carrillo needs to get Paula Carrillo thyroid out for cancer and discussed that given Paula Carrillo concerns for Paula Carrillo vocal cord edema and Paula Carrillo lung function that we might not be able to do surgery for Paula Carrillo gallbladder at [facility].  I have discussed the case with Anesthesia and Dr. Charna Elizabeth and due to the risk of issues with extubation and limited resource at [facility] it is going to be better for Paula Carrillo to have Paula Carrillo gallbladder out at [facility] or [facility].   We can get Paula Carrillo referred for cholecystectomy. Notified the patient of this decision. Paula Carrillo wants to be referred to [facility] for cholecystectomy.   All questions were answered to the satisfaction of the patient.  Virl Cagey 10/30/2020, 5:51 PM

## 2020-11-02 ENCOUNTER — Encounter: Payer: Self-pay | Admitting: *Deleted

## 2020-11-03 ENCOUNTER — Encounter (HOSPITAL_COMMUNITY): Payer: Medicare Other

## 2020-11-03 ENCOUNTER — Ambulatory Visit (HOSPITAL_COMMUNITY): Admission: RE | Admit: 2020-11-03 | Payer: Medicare Other | Source: Ambulatory Visit

## 2020-11-04 ENCOUNTER — Other Ambulatory Visit: Payer: Self-pay

## 2020-11-04 ENCOUNTER — Ambulatory Visit (HOSPITAL_COMMUNITY)
Admission: RE | Admit: 2020-11-04 | Discharge: 2020-11-04 | Disposition: A | Discharge status: Home / Self Care | Payer: Medicare Other | Source: Ambulatory Visit | Attending: Internal Medicine | Admitting: Internal Medicine

## 2020-11-04 DIAGNOSIS — J449 Chronic obstructive pulmonary disease, unspecified: Secondary | ICD-10-CM | POA: Insufficient documentation

## 2020-11-04 LAB — PULMONARY FUNCTION TEST
FEF 25-75 Post: 0.87 L/sec
FEF 25-75 Pre: 0.58 L/sec
FEF2575-%Change-Post: 48 %
FEF2575-%Pred-Post: 39 %
FEF2575-%Pred-Pre: 26 %
FEV1-%Change-Post: 20 %
FEV1-%Pred-Post: 52 %
FEV1-%Pred-Pre: 43 %
FEV1-Post: 1.31 L
FEV1-Pre: 1.08 L
FEV1FVC-%Change-Post: -1 %
FEV1FVC-%Pred-Pre: 69 %
FEV6-%Change-Post: 19 %
FEV6-%Pred-Post: 75 %
FEV6-%Pred-Pre: 63 %
FEV6-Post: 2.34 L
FEV6-Pre: 1.96 L
FEV6FVC-%Change-Post: -5 %
FEV6FVC-%Pred-Post: 98 %
FEV6FVC-%Pred-Pre: 103 %
FVC-%Change-Post: 22 %
FVC-%Pred-Post: 76 %
FVC-%Pred-Pre: 62 %
FVC-Post: 2.47 L
FVC-Pre: 2.02 L
Post FEV1/FVC ratio: 53 %
Post FEV6/FVC ratio: 95 %
Pre FEV1/FVC ratio: 54 %
Pre FEV6/FVC Ratio: 100 %
RV % pred: 205 %
RV: 4.21 L
TLC % pred: 122 %
TLC: 6.19 L

## 2020-11-04 MED ORDER — ALBUTEROL SULFATE (2.5 MG/3ML) 0.083% IN NEBU
2.5000 mg | INHALATION_SOLUTION | Freq: Once | RESPIRATORY_TRACT | Status: AC
  Administered 2020-11-04: 2.5 mg via RESPIRATORY_TRACT

## 2020-11-12 ENCOUNTER — Other Ambulatory Visit: Payer: Self-pay

## 2020-11-12 ENCOUNTER — Ambulatory Visit (INDEPENDENT_AMBULATORY_CARE_PROVIDER_SITE_OTHER): Payer: Medicare Other | Admitting: Internal Medicine

## 2020-11-12 ENCOUNTER — Encounter: Payer: Self-pay | Admitting: Internal Medicine

## 2020-11-12 DIAGNOSIS — J449 Chronic obstructive pulmonary disease, unspecified: Secondary | ICD-10-CM | POA: Diagnosis not present

## 2020-11-12 DIAGNOSIS — F17210 Nicotine dependence, cigarettes, uncomplicated: Secondary | ICD-10-CM | POA: Diagnosis not present

## 2020-11-12 DIAGNOSIS — R058 Other specified cough: Secondary | ICD-10-CM | POA: Diagnosis not present

## 2020-11-12 MED ORDER — PANTOPRAZOLE SODIUM 40 MG PO TBEC: 40.0000 mg | DELAYED_RELEASE_TABLET | Freq: Every day | ORAL | 2 refills | Status: DC

## 2020-11-12 MED ORDER — FAMOTIDINE 20 MG PO TABS: ORAL_TABLET | ORAL | 11 refills | Status: DC

## 2020-11-12 MED ORDER — BREZTRI AEROSPHERE 160-9-4.8 MCG/ACT IN AERO: 2.0000 | INHALATION_SPRAY | Freq: Two times a day (BID) | RESPIRATORY_TRACT | 0 refills | Status: AC

## 2020-11-12 MED ORDER — BREZTRI AEROSPHERE 160-9-4.8 MCG/ACT IN AERO: 2.0000 | INHALATION_SPRAY | Freq: Two times a day (BID) | RESPIRATORY_TRACT | 11 refills | Status: DC

## 2020-11-12 MED ORDER — METHYLPREDNISOLONE ACETATE 80 MG/ML IJ SUSP
120.0000 mg | Freq: Once | INTRAMUSCULAR | Status: AC
  Administered 2020-11-12: 120 mg via INTRAMUSCULAR

## 2020-11-12 NOTE — Assessment & Plan Note (Signed)
4-5 min discussion re active cigarette smoking in addition to office E&M  Ask about tobacco use:   ongoing Advise quitting  I took an extended  opportunity with this patient to outline the consequences of continued cigarette use  in airway disorders based on all the data we have from the multiple national lung health studies (perfomed over decades at millions of dollars in cost)  indicating that smoking cessation, not choice of inhalers or physicians, is the most important aspect of her care and is an immediate concern re clearing for surgery .  Assess willingness:  Not committed at this point Assist in quit attempt:  Per PCP when ready Arrange follow up:   Follow up per Primary Care planned

## 2020-11-12 NOTE — Progress Notes (Signed)
Paula Carrillo, female    DOB: 1955-06-27     MRN: [id number]   Brief patient profile:  58 yowf active smoker says dx with copd around 2012 and maint on advair and spiriva and referred to pulmonary clinic in [facility]  07/29/2020 by Dr   Huel Cote with worsening sob/cough with globus on advair/spiriva dpi    07/13/20 ent eval: moderate interarytenoid edema and erythema consistent with laryngopharyngeal reflux. 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 Referred to [facility] voice center Thyroid nodule x 4 cm > thyroid US    History of Present Illness  07/29/2020  Pulmonary/ 1st office eval/ Melvyn Novas / [facility] Office  Chief Complaint  Patient presents with   Consult    Productive cough with white phlegm, sore throat "something growing in it" since March 2022  Dyspnea:  [employer] shopping but has to go to slow stops each aisle = MMRC3 = can't walk 100 yards even at a slow pace at a flat grade s stopping due to sob   Cough: sense of throat congestion/ globus  on advair/ spiriva  Sleep: < 30 degrees /bed is flat with pillows  SABA use: very confused re how/ when to use it 02  None  rec The key is to stop smoking completely before smoking completely stops you! Plan A = Automatic = Always=    spiriva 2 puffs each am and stop powdered advair and spiriva  Prednisone 10 mg take  4 each am x 2 days,   2 each am x 2 days,  1 each am x 2 days and stop  Plan B = Backup (to supplement plan A, not to replace it) Only use your albuterol inhaler as a rescue medicatio Plan C = Crisis (instead of Plan B but only if Plan B stops working) - only use your albuterol nebulizer if you first try Plan B and it fails to help > ok to use the nebulizer up to every 4 hours but if start needing it regularly call for immediate appointment GERD  diet  Please schedule a follow up office visit in 2 weeks, call sooner if needed with all medications /inhalers/ solutions  in hand so we can verify exactly what you are taking. This includes all medications from all doctors and over the counters     08/11/2020  f/u ov/[place] office/Teaira Croft re: probable copd/CB Chief Complaint  Patient presents with   Follow-up  Dyspnea:  Walks mb 50 ft Cough: worse at hs / mucus white  Sleeping: bed is flat with sev pillows  SABA use: much loss  02: none  Covid status: never vax / never got virus   Rec I very strongly recommend you get the moderna or pfizer vaccine   The key is to stop smoking completely before smoking completely stops you! Pantoprazole (protonix) 40 mg   Take  30-60 min before first meal of the day and Pepcid (famotidine)  20 mg after supper  And stop oil based vitamin GERD diet reviewed, bed blocks rec  Continue spiriva 2.5  2 pffs first thing in am For cough > mucinex dm 1200 mg every every hours as needed  Prednisone 10 mg 2 daily until breathing then 1 daily take with breakfast until prednisone  Please remember to go to the lab department @ [facility] for your tests - we will call you with the results when they are available. Please schedule a follow up office  visit in 6-8  weeks with PFTs on return    11/12/2020  f/u ov/[place] office/Kriste Broman re: prob copd/ cb maint on spiriva 2.5 2 each am but not clear taking it / easily confused with meds  / needs GB surgery Chief Complaint  Patient presents with   Follow-up    Patient says her cough is about the same since she last saw Dr.Haniah Penny. She is coughing up clear mucus She reports some increased shortness of breath. No oxygen at home.    Dyspnea:  across the room  Cough: congested mucoid worse in am  Sleeping: sleeping flat/ lots of cough  SABA use: twice daily  02: no 02  Covid status: never infected/ never vaccinated      No obvious day to day or daytime variability or assoc  purulent sputum or mucus plugs or hemoptysis or cp or chest tightness, subjective wheeze or overt sinus or hb  symptoms.     Also denies any obvious fluctuation of symptoms with weather or environmental changes or other aggravating or alleviating factors except as outlined above   No unusual exposure hx or h/o childhood pna/ asthma or knowledge of premature birth.  Current Allergies, Complete Past Medical History, Past Surgical History, Family History, and Social History were reviewed in [employer] record.  ROS  The following are not active complaints unless bolded Hoarseness, sore throat, dysphagia, dental problems, itching, sneezing,  nasal congestion or discharge of excess mucus or purulent secretions, ear ache,   fever, chills, sweats, unintended wt loss or wt gain, classically pleuritic or exertional cp,  orthopnea pnd or arm/hand swelling  or leg swelling, presyncope, palpitations, abdominal pain, anorexia, nausea, vomiting, diarrhea  or change in bowel habits or change in bladder habits, change in stools or change in urine, dysuria, hematuria,  rash, arthralgias, visual complaints, headache, numbness, weakness or ataxia or problems with walking or coordination,  change in mood or  memory.        Current Meds - - NOTE:   Unable to verify as accurately reflecting what pt takes    Medication Sig   albuterol (PROVENTIL HFA;VENTOLIN HFA) 108 (90 BASE) MCG/ACT inhaler Inhale 2 puffs into the lungs every 6 (six) hours as needed. Shortness of breath   albuterol (PROVENTIL) (2.5 MG/3ML) 0.083% nebulizer solution Take 3 mLs (2.5 mg total) by nebulization every 4 (four) hours as needed for wheezing or shortness of breath.   Calcium Carb-Cholecalciferol (SUPER CALCIUM 600 + D 400 PO) Take 1 tablet by mouth daily.   famotidine (PEPCID) 20 MG tablet One after supper   HYDROcodone-acetaminophen (NORCO/VICODIN) 5-325 MG tablet Take 1 tablet by mouth every 6 (six) hours as needed for moderate pain.   hydrOXYzine (ATARAX/VISTARIL) 25 MG tablet Take 25 mg by mouth 3 (three) times daily as  needed.   montelukast (SINGULAIR) 10 MG tablet Take 10 mg by mouth at bedtime.   naproxen sodium (ALEVE) 220 MG tablet Take 220 mg by mouth 2 (two) times daily as needed.   ondansetron (ZOFRAN ODT) 4 MG disintegrating tablet '4mg'$  ODT q4 hours prn nausea/vomit   pantoprazole (PROTONIX) 40 MG tablet Take 1 tablet (40 mg total) by mouth daily. Take 30-60 min before first meal of the day   predniSONE (DELTASONE) 10 MG tablet 2 daily until better then 1 daily   Tiotropium Bromide Monohydrate (SPIRIVA RESPIMAT) 2.5 MCG/ACT AERS 2 puffs each am   vitamin B-12 (CYANOCOBALAMIN) 500 MCG tablet Take 500 mcg by mouth daily.  Past Medical History:  Diagnosis Date   Asthma    Cancer ([place])    Chronic abdominal pain    Chronic back pain    Chronic neck pain    Chronic pain    Colon cancer (HCC)    COPD (chronic obstructive pulmonary disease) (HCC)    Hemorrhoids    IBS (irritable bowel syndrome)    Migraines        Objective:      11/12/2020        140   07/29/20 140 lb 12.8 oz (63.9 kg)  03/31/15 165 lb 9.6 oz (75.1 kg)  08/11/14 170 lb (77.1 kg)     Vital signs reviewed  11/12/2020  - Note at rest 02 sats  97% on RA   General appearance:    chronically ill wf/ very congested cough / easily coconfused with details of care     HEENT : pt wearing mask not removed for exam due to covid -19 concerns.    NECK :  without JVD/Nodes/TM/ nl carotid upstrokes bilaterally   LUNGS: no acc muscle use,  Mod barrel  contour chest wall with bilateral  exp wheeze and  with cough on insp >  exp maneuvers and mod  Hyperresonant  to  percussion bilaterally     CV:  RRR  no s3 or murmur or increase in P2, and no edema   ABD:  soft and nontender with pos mid insp Hoover's  in the supine position. No bruits or organomegaly appreciated, bowel sounds nl  MS:     ext warm without deformities, calf tenderness, cyanosis or clubbing No obvious joint restrictions   SKIN: warm and dry  without lesions    NEURO:  alert, approp, nl sensorium with  no motor or cerebellar deficits apparent.        I personally reviewed images and agree with radiology impression as follows:  CXR:   10/26/20 No acute findings.      Assessment

## 2020-11-12 NOTE — Patient Instructions (Addendum)
Stop spiriva  Plan A = Automatic = Always=  Breztri Take 2 puffs first thing in am and then another 2 puffs about 12 hours later.    Work on inhaler technique:  relax and gently blow all the way out then take a nice smooth full deep breath back in, triggering the inhaler at same time you start breathing in.  Hold for up to 5 seconds if you can. Blow out thru nose. Rinse and gargle with water when done.  If mouth or throat bother you at all,  try brushing teeth/gums/tongue with arm and hammer toothpaste/ make a slurry and gargle and spit out.       Plan B = Backup (to supplement plan A, not to replace it) Only use your albuterol inhaler as a rescue medication to be used if you can't catch your breath by resting or doing a relaxed purse lip breathing pattern.  - The less you use it, the better it will work when you need it. - Ok to use the inhaler up to 2 puffs  every 4 hours if you must but call for appointment if use goes up over your usual need - Don't leave home without it !!  (think of it like the spare tire for your car)   Plan C = Crisis (instead of Plan B but only if Plan B stops working) - only use your albuterol nebulizer if you first try Plan B and it fails to help > ok to use the nebulizer up to every 4 hours but if start needing it regularly call for immediate appointment  Pantoprazole (protonix) 40 mg   Take  30-60 min before first meal of the day and Pepcid (famotidine)  20 mg after supper until return to office - this is the best way to tell whether stomach acid is contributing to your problem.   For cough > mucinex dm 1200 mg every 12 hours as needed   8 inch blocks under head of bed   Depomedrol 120 mg IM today   Please schedule a follow up office visit in 6 weeks, call sooner if needed - always bring all active medications /inhalers / solutions  - call me with discrepancy

## 2020-11-12 NOTE — Assessment & Plan Note (Addendum)
Active smoker  - Labs ordered 07/29/2020  :  allergy profile   alpha one AT phenotype   - 07/29/2020  After extensive coaching inhaler device,  effectiveness =   spiriva smi 2.5 mg q am and prn saba - 08/11/2020 started daily pred 20 mg until better then 10 mg daily > caused thrush so stopped - PFT's 11/04/20   FEV1 1.31  (52 % ) ratio 0.53  p 20 % improvement from saba p ? prior to study with FV curve poor insp flow but no true plateau, classic exp concavity.   - 11/12/2020  After extensive coaching inhaler device,  effectiveness =    80% so try breztri 2bid/ depomedrol 120 mg IM and approp saba Labs ordered 11/12/2020  :  allergy profile   alpha one AT phenotype    DDX of  difficult airways management almost all start with A and  include Adherence, Ace Inhibitors, Acid Reflux, Active Sinus Disease, Alpha 1 Antitripsin deficiency, Anxiety masquerading as Airways dz,  ABPA,  Allergy(esp in young), Aspiration (esp in elderly), Adverse effects of meds,  Active smoking or vaping, A bunch of PE's (a small clot burden can't cause this syndrome unless there is already severe underlying pulm or vascular dz with poor reserve) plus two Bs  = Bronchiectasis and Beta blocker use..and one C= CHF  Adherence is always the initial "prime suspect" and is a multilayered concern that requires a "trust but verify" approach in every patient - starting with knowing how to use medications, especially inhalers, correctly, keeping up with refills and understanding the fundamental difference between maintenance and prns vs those medications only taken for a very short course and then stopped and not refilled.  - see hfa teaching - return with all meds in hand using a trust but verify approach to confirm accurate Medication  Reconciliation The principal here is that until we are certain that the  patients are doing what we've asked, it makes no sense to ask them to do more.   Active smoking also at top of list > see sep a/p  ? Acid  (or non-acid) GERD > always difficult to exclude as up to 75% of pts in some series report no assoc GI/ Heartburn symptoms> rec max (24h)  acid suppression and diet restrictions/ reviewed and instructions given in writing.    ? Allergy > check eos/ igE and rx breztri (high dose ICS) singulair for now and depomedrol 120 mg IM today but hope to avoid pred based on poor tol last try  ? 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

## 2020-11-12 NOTE — Assessment & Plan Note (Addendum)
D/c powdered inhalers 07/29/2020  - added max rx for gerd 08/11/2020 > ? If ever took it  - 11/12/2020 rec max gerd/ bed blocks and f/u at [facility] as previously planned         Each maintenance medication was reviewed in detail including emphasizing most importantly the difference between maintenance and prns and under what circumstances the prns are to be triggered using an action plan format where appropriate.  Total time for H and P, chart review, counseling, reviewing hfa device(s) and generating customized AVS unique to this office visit / same day charting = > 30 min

## 2020-11-12 NOTE — Addendum Note (Signed)
Addended by: Rosana Berger on: 11/12/2020 10:38 AM   Modules accepted: Orders

## 2020-11-18 LAB — CBC WITH DIFFERENTIAL/PLATELET
Basophils Absolute: 0.1 10*3/uL (ref 0.0–0.2)
Basos: 1 %
EOS (ABSOLUTE): 0.5 10*3/uL — ABNORMAL HIGH (ref 0.0–0.4)
Eos: 8 %
Hematocrit: 41.1 % (ref 34.0–46.6)
Hemoglobin: 14.4 g/dL (ref 11.1–15.9)
Immature Grans (Abs): 0 10*3/uL (ref 0.0–0.1)
Immature Granulocytes: 0 %
Lymphocytes Absolute: 2.3 10*3/uL (ref 0.7–3.1)
Lymphs: 38 %
MCH: 31 pg (ref 26.6–33.0)
MCHC: 35 g/dL (ref 31.5–35.7)
MCV: 88 fL (ref 79–97)
Monocytes Absolute: 0.7 10*3/uL (ref 0.1–0.9)
Monocytes: 11 %
Neutrophils Absolute: 2.6 10*3/uL (ref 1.4–7.0)
Neutrophils: 42 %
Platelets: 326 10*3/uL (ref 150–450)
RBC: 4.65 x10E6/uL (ref 3.77–5.28)
RDW: 12.3 % (ref 11.7–15.4)
WBC: 6.1 10*3/uL (ref 3.4–10.8)

## 2020-11-18 LAB — ALPHA-1-ANTITRYPSIN PHENOTYP: A-1 Antitrypsin: 93 mg/dL — ABNORMAL LOW (ref 101–187)

## 2020-11-18 LAB — IGE: IgE (Immunoglobulin E), Serum: 67 IU/mL (ref 6–495)

## 2020-11-23 ENCOUNTER — Other Ambulatory Visit: Payer: Self-pay | Admitting: Otolaryngology

## 2020-12-02 ENCOUNTER — Encounter: Payer: Self-pay | Admitting: Gastroenterology

## 2020-12-08 ENCOUNTER — Ambulatory Visit: Payer: Self-pay | Admitting: General Surgery

## 2020-12-11 NOTE — Pre-Procedure Instructions (Signed)
Surgical Instructions    Your procedure is scheduled on Wednesday, October 5th.  Report to [facility] Main Entrance "A" at 6:30 A.M., then check in with the Admitting office.  Call this number if you have problems the morning of surgery:  [phone]   If you have any questions prior to your surgery date call [phone]: Open Monday-Friday 8am-4pm    Remember:  Do not eat or drink after midnight the night before your surgery    Take these medicines the morning of surgery with A SIP OF WATER  Budeson-Glycopyrrol-Formoterol (BREZTRI AEROSPHERE)  famotidine (PEPCID) pantoprazole (PROTONIX) predniSONE (DELTASONE)   As needed: acetaminophen (TYLENOL) Albuterol inhaler-please bring with you to the hospital Albuterol nebulizer fluticasone (FLONASE) HYDROcodone-acetaminophen (NORCO/VICODIN) ondansetron (ZOFRAN ODT)  As of today, STOP taking any Aspirin (unless otherwise instructed by your surgeon) Aleve, Naproxen, Ibuprofen, Motrin, Advil, Goody's, BC's, all herbal medications, fish oil, and all vitamins.                     Do NOT Smoke (Tobacco/Vaping) or drink Alcohol 24 hours prior to your procedure.  If you use a CPAP at night, you may bring all equipment for your overnight stay.   Contacts, glasses, piercing's, hearing aid's, dentures or partials may not be worn into surgery, please bring cases for these belongings.    For patients admitted to the hospital, discharge time will be determined by your treatment team.   Patients discharged the day of surgery will not be allowed to drive home, and someone needs to stay with them for 24 hours.  NO VISITORS WILL BE ALLOWED IN PRE-OP WHERE PATIENTS GET READY FOR SURGERY.  ONLY 1 SUPPORT PERSON MAY BE PRESENT IN THE WAITING ROOM WHILE YOU ARE IN SURGERY.  IF YOU ARE TO BE ADMITTED, ONCE YOU ARE IN YOUR ROOM YOU WILL BE ALLOWED TWO (2) VISITORS.  Minor children may have two parents present. Special consideration for safety and  communication needs will be reviewed on a case by case basis.   Special instructions:   [place]- Preparing For Surgery  Before surgery, you can play an important role. Because skin is not sterile, your skin needs to be as free of germs as possible. You can reduce the number of germs on your skin by washing with CHG (chlorahexidine gluconate) Soap before surgery.  CHG is an antiseptic cleaner which kills germs and bonds with the skin to continue killing germs even after washing.    Oral Hygiene is also important to reduce your risk of infection.  Remember - BRUSH YOUR TEETH THE MORNING OF SURGERY WITH YOUR REGULAR TOOTHPASTE  Please do not use if you have an allergy to CHG or antibacterial soaps. If your skin becomes reddened/irritated stop using the CHG.  Do not shave (including legs and underarms) for at least 48 hours prior to first CHG shower. It is OK to shave your face.  Please follow these instructions carefully.   Shower the NIGHT BEFORE SURGERY and the MORNING OF SURGERY  If you chose to wash your hair, wash your hair first as usual with your normal shampoo.  After you shampoo, rinse your hair and body thoroughly to remove the shampoo.  Use CHG Soap as you would any other liquid soap. You can apply CHG directly to the skin and wash gently with a scrungie or a clean washcloth.   Apply the CHG Soap to your body ONLY FROM THE NECK DOWN.  Do not use on open  wounds or open sores. Avoid contact with your eyes, ears, mouth and genitals (private parts). Wash Face and genitals (private parts)  with your normal soap.   Wash thoroughly, paying special attention to the area where your surgery will be performed.  Thoroughly rinse your body with warm water from the neck down.  DO NOT shower/wash with your normal soap after using and rinsing off the CHG Soap.  Pat yourself dry with a CLEAN TOWEL.  Wear CLEAN PAJAMAS to bed the night before surgery  Place CLEAN SHEETS on your bed the  night before your surgery  DO NOT SLEEP WITH PETS.   Day of Surgery: Shower with CHG soap. Do not wear jewelry, make up, nail polish, gel polish, artificial nails, or any other type of covering on natural nails including finger and toenails. If patients have artificial nails, gel coating, etc. that need to be removed by a nail salon please have this removed prior to surgery. Surgery may need to be canceled/delayed if the surgeon/ anesthesia feels like the patient is unable to be adequately monitored. Do not wear lotions, powders, perfumes, or deodorant. Do not shave 48 hours prior to surgery.  Do not bring valuables to the hospital. [facility] is not responsible for any belongings or valuables. Wear Clean/Comfortable clothing the morning of surgery Remember to brush your teeth WITH YOUR REGULAR TOOTHPASTE.   Please read over the following fact sheets that you were given.   3 days prior to your procedure or After your COVID test   You are not required to quarantine however you are required to wear a well-fitting mask when you are out and around people not in your household. If your mask becomes wet or soiled, replace with a new one.   Wash your hands often with soap and water for 20 seconds or clean your hands with an alcohol-based hand sanitizer that contains at least 60% alcohol.   Do not share personal items.   Notify your provider:  o if you are in close contact with someone who has COVID  o or if you develop a fever of 100.4 or greater, sneezing, cough, sore throat, shortness of breath or body aches.

## 2020-12-14 ENCOUNTER — Encounter (HOSPITAL_COMMUNITY)
Admission: RE | Admit: 2020-12-14 | Discharge: 2020-12-14 | Disposition: A | Discharge status: Home / Self Care | Payer: Medicare Other | Source: Ambulatory Visit | Attending: Otolaryngology | Admitting: Otolaryngology

## 2020-12-14 ENCOUNTER — Encounter (HOSPITAL_COMMUNITY): Payer: Self-pay

## 2020-12-14 ENCOUNTER — Other Ambulatory Visit: Payer: Self-pay | Admitting: Otolaryngology

## 2020-12-14 ENCOUNTER — Other Ambulatory Visit: Payer: Self-pay

## 2020-12-14 DIAGNOSIS — Z20822 Contact with and (suspected) exposure to covid-19: Secondary | ICD-10-CM | POA: Insufficient documentation

## 2020-12-14 DIAGNOSIS — K589 Irritable bowel syndrome without diarrhea: Secondary | ICD-10-CM | POA: Insufficient documentation

## 2020-12-14 DIAGNOSIS — Z01812 Encounter for preprocedural laboratory examination: Secondary | ICD-10-CM | POA: Diagnosis not present

## 2020-12-14 DIAGNOSIS — G43909 Migraine, unspecified, not intractable, without status migrainosus: Secondary | ICD-10-CM | POA: Diagnosis not present

## 2020-12-14 DIAGNOSIS — K219 Gastro-esophageal reflux disease without esophagitis: Secondary | ICD-10-CM | POA: Insufficient documentation

## 2020-12-14 DIAGNOSIS — K8080 Other cholelithiasis without obstruction: Secondary | ICD-10-CM | POA: Insufficient documentation

## 2020-12-14 DIAGNOSIS — E041 Nontoxic single thyroid nodule: Secondary | ICD-10-CM | POA: Insufficient documentation

## 2020-12-14 DIAGNOSIS — F17210 Nicotine dependence, cigarettes, uncomplicated: Secondary | ICD-10-CM | POA: Diagnosis not present

## 2020-12-14 DIAGNOSIS — G8929 Other chronic pain: Secondary | ICD-10-CM | POA: Diagnosis not present

## 2020-12-14 DIAGNOSIS — J449 Chronic obstructive pulmonary disease, unspecified: Secondary | ICD-10-CM | POA: Diagnosis not present

## 2020-12-14 DIAGNOSIS — Z79899 Other long term (current) drug therapy: Secondary | ICD-10-CM | POA: Diagnosis not present

## 2020-12-14 DIAGNOSIS — Z85038 Personal history of other malignant neoplasm of large intestine: Secondary | ICD-10-CM | POA: Diagnosis not present

## 2020-12-14 HISTORY — DX: Unspecified osteoarthritis, unspecified site: M19.90

## 2020-12-14 LAB — CBC
HCT: 44.1 % (ref 36.0–46.0)
Hemoglobin: 14.1 g/dL (ref 12.0–15.0)
MCH: 30.3 pg (ref 26.0–34.0)
MCHC: 32 g/dL (ref 30.0–36.0)
MCV: 94.8 fL (ref 80.0–100.0)
Platelets: 343 10*3/uL (ref 150–400)
RBC: 4.65 MIL/uL (ref 3.87–5.11)
RDW: 12.8 % (ref 11.5–15.5)
WBC: 8.5 10*3/uL (ref 4.0–10.5)
nRBC: 0 % (ref 0.0–0.2)

## 2020-12-14 LAB — SARS CORONAVIRUS 2 (TAT 6-24 HRS): SARS Coronavirus 2: NEGATIVE

## 2020-12-14 NOTE — Progress Notes (Signed)
PCP - Dr. Oswald Hillock Cardiologist - denies  Chest x-ray - 10/26/20-1 view EKG - 10/27/20 Stress Test - denies ECHO - denies Cardiac Cath - denies  Sleep Study - denies CPAP - denies  Blood Thinner Instructions: n/a Aspirin Instructions: n/a  ERAS Protcol -n/a PRE-SURGERY Ensure or G2- n/a  COVID TEST- 12/14/20; done in PAT.  Anesthesia review: Hx of COPD and asthma  Patient denies shortness of breath, fever, cough and chest pain at PAT appointment   All instructions explained to the patient, with a verbal understanding of the material. Patient agrees to go over the instructions while at home for a better understanding. Patient also instructed to self quarantine after being tested for COVID-19. The opportunity to ask questions was provided.

## 2020-12-15 NOTE — Progress Notes (Signed)
Anesthesia Chart Review:  Case: [id number] Date/Time: 12/16/20 0815   Procedure: TOTAL THYROIDECTOMY   Anesthesia type: General   Pre-op diagnosis: Thyroid nodule   Location: [facility] OR ROOM 08 / [place] [facility]   Surgeons: Ebbie Latus A, DO       DISCUSSION: Patient is a 65 year old female scheduled for the above procedure. Recent US suggestive of multi-nodular goiter. FNA isthmus nodule suspicious for malignancy (Bethesda category V). Of note she was also recently referred to general surgeon Autumn Messing, MD for consideration of future cholecystectomy. She was initially seen in [place] by Dyann Ruddle, MD for symptomatic gallstones but due to her COPD and concern for edematous vocal cord/Reinke's edema, the anesthesiologist at [facility] advised that surgery be done at [facility] or [facility]. Dr. Constance Haw also recommended getting thyroid cancer addressed first since no concern for acute cholecystitis at that time. Dr. Marlou Starks is planning to schedule her cholecystectomy after she recovers from thyroidectomy.  Other history includes smoking, asthma, COPD, chronic pain, IBS, migraines, colon cancer (~ 2007, s/p resection), multi-nodular goiter with suspicious nodule. 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   Last pulmonologist visit with Dr. Melvyn Novas was on 11/12/20. He first saw her on 07/29/20 after referral from Dr. Fredric Dine. She was given a prednisone taper and changed from powdered Advair and Spiriva 18 mcg to Spiriva 2.5 mcg 2 puffs Q AM. Max treatment for GERD added at 08/11/20 visit. Mucinex DM added as needed for cough and another prednisone taper prescribed. At her 11/12/20 visit, he reviewed recent PFTs. He noted she had a very congested cough and seemed easily confused with details of care. She had stopped the prednisone due to thrush. Expiratory wheezes  noted on exam. Still smoking. He noted need for future cholecystectomy. (Note does not mention new concern for thyroid cancer and need for thyroidectomy.) Spiriva also changed to [employer] (Budeson-Glycopyrrol-Formoterol) 2 puffs daily. She was given Depomedrol 120 mg IM x1 to avoid increased prednisone. (Per patient, she has been on daily prednisone 10 mg daily for around a year now.) She was advised to use 8 in blocks under her head, but she says she has been using 3 pillows instead. 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 IgE normal. In regards to cholecystectomy, he wrote, "I believe she should be good to go for GB surgery if I can get her to buy into following instructions and regular f/u but for now unless more urgent I would put off surgery."  Since then total thyroidectomy for suspected thyroid cancer has been recommended.   I called to ask patient how she was doing. She feels at her baseline. She is not on home O2. She has a chronic productive cough, clearish-white phlegm. She says this is chronic. She says breathing is "so-so" to "pretty good" during the day, but when she lies flat she feels like her throat is closing and can notice some wheezing. She notices that she may feel like she may chock with certain foods. Unclear if there is a degree of compressive symptoms relater to her thyroid. She may have some very mild SOB at rest, but primarily SOB is with activity, particularly outside. She is able to go to the grocery store and walk around. She is able to sweep but has to do so slowly. Her back and legs hurt, so she does  not do much vacuuming. She continues to smoke about 1 PPD. She denied hospitalization for COPD exacerbations. She is compliant with her pulmonary regimen including Breztri, prednisone, albuterol nebulizer BID, Flonase, Singulair, Pepcid, Protonix. She said she always has a congested cough but feels her congestion is better  since her medication changes. Her voice is raspy. No significant coughing or conversational dyspnea noted over the telephone call. She denied chest pain or palpitations. She gets occasional mild ankle edema for years. She may get occasional dizziness when she is more SOB, but no syncope. She denied prior stress test, echo, or cath.   Last visit with PCP Dr. Huel Cote was on 11/18/20. Patient was still waiting thyroidectomy to be scheduled, so Dr. Huel Cote contacted ENT to schedule.    12/14/20 presurgical COVID-19 test negative.   I discussed above with anesthesiologist [place], Belenda Cruise, DO. Anesthesia team to evaluate on the day of surgery.    VS: BP 132/78   Pulse 73   Temp 36.6 C   Resp 19   Ht 5\' 4"  (1.626 m)   Wt 61.6 kg   SpO2 97%   BMI 23.33 kg/m    PROVIDERS: Leeanne Rio, MD is PCP ([place] [facility]) Christinia Gully, MD is pulmonologist       LABS: Labs reviewed: Acceptable for surgery. Normal CMET on 10/26/20 except low total protein at 6.3. (all labs ordered are listed, but only abnormal results are displayed)  Labs Reviewed  SARS CORONAVIRUS 2 (TAT 6-24 HRS)  CBC   PFTs 11/04/20:  FVC-Pre 2.02 L   FVC-%Pred-Pre 62 %   FVC-Post 2.47 L   FVC-%Pred-Post 76 %   FVC-%Change-Post 22 %   FEV1-Pre 1.08 L   FEV1-%Pred-Pre 43 %   FEV1-Post 1.31 L   FEV1-%Pred-Post 52 %   FEV1-%Change-Post 20 %   FEV6-Pre 1.96 L   FEV6-%Pred-Pre 63 %   FEV6-Post 2.34 L   FEV6-%Pred-Post 75 %   FEV6-%Change-Post 19 %   Pre FEV1/FVC ratio 54 %   FEV1FVC-%Pred-Pre 69 %   Post FEV1/FVC ratio 53 %   FEV1FVC-%Change-Post -1 %   Pre FEV6/FVC Ratio 100 %   FEV6FVC-%Pred-Pre 103 %   Post FEV6/FVC ratio 95 %   FEV6FVC-%Pred-Post 98 %   FEV6FVC-%Change-Post -5 %   FEF 25-75 Pre 0.58 L/sec   FEF2575-%Pred-Pre 26 %   FEF 25-75 Post 0.87 L/sec   FEF2575-%Pred-Post 39 %   FEF2575-%Change-Post 48 %   RV 4.21 L   RV % pred 205 %   TLC 6.19 L   TLC % pred 122 %  "PFT's 11/04/20    FEV1 1.31  (52 % ) ratio 0.53  p 20 % improvement from saba p ? prior to study with FV curve poor insp flow but no true plateau, classic exp concavity."  Transnasal Video Laryngostroboscopy 08/27/20 ([facility]): Summary of Findings: The videostroboscopic exam revealed A-P and lateral compression of the supraglottis with phonation.  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 Visualized subglottis patent. "SAPMUC": - Asymmetric (L>R Reinke's edema) - Mildly increased amplitude bilaterally -Periodicity appears relatively regular - Mucosal wave increased bilaterally - With ongoing smoking, felt to be a suboptimal candidate for voice therapy  Flexible Laryngoscopy 07/13/20 ([facility]): Indications: Hoarseness Findings: Flexible laryngoscopy shows patent anterior nasal cavity with minimal crusting, no discharge or infection. Copious clear drainage noted, no evidence of purulent drainage. Fossa of Rosenmuller normal bilaterally. Normal base of tongue and  supraglottis; moderate interarytenoid edema and erythema consistent with laryngopharyngeal reflux Normal vocal cord mobility; polypoid degeneration of bilateral true vocal folds noted consistent with Rienke's edema. No obvious ulceration, leukoplakia, or mass lesion noted. Hypopharynx normal without mass, pooling of secretions or aspiration.  IMAGES: 1V CXR 10/26/20: FINDINGS: Trachea is midline. Heart size normal. Lungs are clear. No pleural fluid. IMPRESSION: No acute findings.  US Thyroid 07/31/20: IMPRESSION: 1. Findings suggestive of multinodular goiter. 2. Nodule #1, likely correlating with the palpable area of concern, meets imaging criteria to recommend percutaneous sampling as indicated. 3. The additional subcentimeter nodules do not meet imaging criteria to recommend percutaneous sampling or continued dedicated follow-up.   EKG: EKG 10/26/20: Sinus rhythm Borderline  short PR interval Right axis deviation Abnormal lateral Q waves Prolonged QT interval No significant change since last tracing Confirmed by Aletta Edouard [phone]) on 10/27/2020 4:48:12 PM - On tracing QT is measures at 582 ms; however, QT interval in V2 is 420 ms by my measurement.  CV: N/A   Past Medical History:  Diagnosis Date   Arthritis    Asthma    Cancer ([place])    Chronic abdominal pain    Chronic back pain    Chronic neck pain    Chronic pain    Colon cancer (HCC)    COPD (chronic obstructive pulmonary disease) (HCC)    Hemorrhoids    IBS (irritable bowel syndrome)    Migraines    Thyroid disease     Past Surgical History:  Procedure Laterality Date   ABDOMINAL HYSTERECTOMY     APPENDECTOMY     BREAST LUMPECTOMY     pt denies this   COLON RESECTION     COLONOSCOPY  07/17/2009   VOZ:[id number] internal hemorrhoids/tortuous colon/3-mm sessile hepatic polyp/sigmoid colon diverticula, small internal hemorrhoids, path: tubular adenoma   COLONOSCOPY N/A 07/23/2012   IHK:[id number] polyp in the ascending colon/Two sessile polyps in the rectum/RECTAL BLEEDING DUE TO Large internal hemorrhoids-S/P BANDINGx3    MEDICATIONS:  acetaminophen (TYLENOL) 650 MG CR tablet   albuterol (PROVENTIL HFA;VENTOLIN HFA) 108 (90 BASE) MCG/ACT inhaler   albuterol (PROVENTIL) (2.5 MG/3ML) 0.083% nebulizer solution   Budeson-Glycopyrrol-Formoterol (BREZTRI AEROSPHERE) 160-9-4.8 MCG/ACT AERO   Calcium Carb-Cholecalciferol (SUPER CALCIUM 600 + D 400 PO)   famotidine (PEPCID) 20 MG tablet   fluticasone (FLONASE) 50 MCG/ACT nasal spray   HYDROcodone-acetaminophen (NORCO/VICODIN) 5-325 MG tablet   montelukast (SINGULAIR) 10 MG tablet   naproxen sodium (ALEVE) 220 MG tablet   ondansetron (ZOFRAN ODT) 4 MG disintegrating tablet   pantoprazole (PROTONIX) 40 MG tablet   predniSONE (DELTASONE) 10 MG tablet   vitamin B-12 (CYANOCOBALAMIN) 500 MCG tablet   vitamin C (ASCORBIC ACID) 500 MG tablet   No  current facility-administered medications for this encounter.    Myra Gianotti, PA-C Surgical Short Stay/Anesthesiology [facility] Phone [phone] [facility] Phone [phone] 12/15/2020 4:25 PM

## 2020-12-15 NOTE — Anesthesia Preprocedure Evaluation (Addendum)
Anesthesia Evaluation  Patient identified by MRN, date of birth, ID band Patient awake    Reviewed: Allergy & Precautions, NPO status , Patient's Chart, lab work & pertinent test results  History of Anesthesia Complications Negative for: history of anesthetic complications  Airway Mallampati: I  TM Distance: >3 FB Neck ROM: Full    Dental  (+) Edentulous Upper, Edentulous Lower, Dental Advisory Given   Pulmonary COPD,  COPD inhaler, Current Smoker,   Albuterol given   + wheezing      Cardiovascular negative cardio ROS Normal cardiovascular exam     Neuro/Psych negative neurological ROS     GI/Hepatic Neg liver ROS, GERD  ,IBS Colon Ca   Endo/Other  negative endocrine ROS  Renal/GU negative Renal ROS     Musculoskeletal negative musculoskeletal ROS (+)   Abdominal   Peds  Hematology negative hematology ROS (+)   Anesthesia Other Findings   Reproductive/Obstetrics                           Anesthesia Physical Anesthesia Plan  ASA: 3  Anesthesia Plan: General   Post-op Pain Management:    Induction: Intravenous  PONV Risk Score and Plan: 4 or greater and Ondansetron, Dexamethasone, Midazolam and Scopolamine patch - Pre-op  Airway Management Planned: Oral ETT  Additional Equipment:   Intra-op Plan:   Post-operative Plan: Extubation in OR  Informed Consent: I have reviewed the patients History and Physical, chart, labs and discussed the procedure including the risks, benefits and alternatives for the proposed anesthesia with the patient or authorized representative who has indicated his/her understanding and acceptance.     Dental advisory given  Plan Discussed with: Anesthesiologist and CRNA  Anesthesia Plan Comments: (PAT note written 12/15/2020 by Myra Gianotti, PA-C. )      Anesthesia Quick Evaluation

## 2020-12-16 ENCOUNTER — Ambulatory Visit (HOSPITAL_COMMUNITY): Payer: Medicare Other | Admitting: Anesthesiology

## 2020-12-16 ENCOUNTER — Observation Stay (HOSPITAL_COMMUNITY)
Admission: RE | Admit: 2020-12-16 | Discharge: 2020-12-17 | Disposition: A | Discharge status: Home / Self Care | Payer: Medicare Other | Source: Ambulatory Visit | Attending: Otolaryngology | Admitting: Otolaryngology

## 2020-12-16 ENCOUNTER — Encounter (HOSPITAL_COMMUNITY)
Admission: RE | Disposition: A | Discharge status: Home / Self Care | Payer: Self-pay | Source: Ambulatory Visit | Attending: Otolaryngology

## 2020-12-16 ENCOUNTER — Other Ambulatory Visit: Payer: Self-pay

## 2020-12-16 ENCOUNTER — Ambulatory Visit (HOSPITAL_COMMUNITY): Payer: Medicare Other | Admitting: Vascular Surgery

## 2020-12-16 ENCOUNTER — Encounter (HOSPITAL_COMMUNITY): Payer: Self-pay | Admitting: Otolaryngology

## 2020-12-16 DIAGNOSIS — F17210 Nicotine dependence, cigarettes, uncomplicated: Secondary | ICD-10-CM | POA: Diagnosis not present

## 2020-12-16 DIAGNOSIS — C73 Malignant neoplasm of thyroid gland: Principal | ICD-10-CM | POA: Insufficient documentation

## 2020-12-16 DIAGNOSIS — Z79899 Other long term (current) drug therapy: Secondary | ICD-10-CM | POA: Diagnosis not present

## 2020-12-16 DIAGNOSIS — Z85038 Personal history of other malignant neoplasm of large intestine: Secondary | ICD-10-CM | POA: Insufficient documentation

## 2020-12-16 DIAGNOSIS — J449 Chronic obstructive pulmonary disease, unspecified: Secondary | ICD-10-CM | POA: Insufficient documentation

## 2020-12-16 DIAGNOSIS — J45909 Unspecified asthma, uncomplicated: Secondary | ICD-10-CM | POA: Diagnosis not present

## 2020-12-16 DIAGNOSIS — E041 Nontoxic single thyroid nodule: Secondary | ICD-10-CM | POA: Diagnosis present

## 2020-12-16 HISTORY — PX: THYROIDECTOMY: SHX17

## 2020-12-16 LAB — ALBUMIN
Albumin: 3.2 g/dL — ABNORMAL LOW (ref 3.5–5.0)
Albumin: 3.2 g/dL — ABNORMAL LOW (ref 3.5–5.0)

## 2020-12-16 LAB — MAGNESIUM: Magnesium: 1.7 mg/dL (ref 1.7–2.4)

## 2020-12-16 LAB — CALCIUM: Calcium: 8.2 mg/dL — ABNORMAL LOW (ref 8.9–10.3)

## 2020-12-16 SURGERY — THYROIDECTOMY
Anesthesia: General | Site: Neck

## 2020-12-16 MED ORDER — ONDANSETRON HCL 4 MG/2ML IJ SOLN: 4.0000 mg | INTRAMUSCULAR | Status: DC | PRN

## 2020-12-16 MED ORDER — LIDOCAINE-EPINEPHRINE 1 %-1:100000 IJ SOLN
INTRAMUSCULAR | Status: DC | PRN
  Administered 2020-12-16: 10 mL

## 2020-12-16 MED ORDER — HYDROCODONE-ACETAMINOPHEN 5-325 MG PO TABS
1.0000 | ORAL_TABLET | ORAL | Status: DC | PRN
  Administered 2020-12-16 – 2020-12-17 (×3): 2 via ORAL
  Filled 2020-12-16 (×3): qty 2

## 2020-12-16 MED ORDER — MIDAZOLAM HCL 5 MG/5ML IJ SOLN
INTRAMUSCULAR | Status: DC | PRN
  Administered 2020-12-16: 2 mg via INTRAVENOUS

## 2020-12-16 MED ORDER — ALBUTEROL SULFATE (2.5 MG/3ML) 0.083% IN NEBU
INHALATION_SOLUTION | RESPIRATORY_TRACT | Status: AC
  Filled 2020-12-16: qty 3

## 2020-12-16 MED ORDER — ACETAMINOPHEN 500 MG PO TABS
1000.0000 mg | ORAL_TABLET | Freq: Once | ORAL | Status: AC
  Administered 2020-12-16: 1000 mg via ORAL
  Filled 2020-12-16: qty 2

## 2020-12-16 MED ORDER — PROPOFOL 10 MG/ML IV BOLUS
INTRAVENOUS | Status: DC | PRN
  Administered 2020-12-16: 100 mg via INTRAVENOUS

## 2020-12-16 MED ORDER — FENTANYL CITRATE (PF) 250 MCG/5ML IJ SOLN
INTRAMUSCULAR | Status: AC
  Filled 2020-12-16: qty 5

## 2020-12-16 MED ORDER — LACTATED RINGERS IV SOLN: INTRAVENOUS | Status: DC

## 2020-12-16 MED ORDER — LIDOCAINE-EPINEPHRINE 1 %-1:100000 IJ SOLN
INTRAMUSCULAR | Status: AC
  Filled 2020-12-16: qty 1

## 2020-12-16 MED ORDER — SODIUM CHLORIDE 0.9 % IV SOLN: INTRAVENOUS | Status: DC

## 2020-12-16 MED ORDER — DEXAMETHASONE SODIUM PHOSPHATE 10 MG/ML IJ SOLN
INTRAMUSCULAR | Status: DC | PRN
  Administered 2020-12-16: 10 mg via INTRAVENOUS

## 2020-12-16 MED ORDER — ROCURONIUM BROMIDE 10 MG/ML (PF) SYRINGE
PREFILLED_SYRINGE | INTRAVENOUS | Status: DC | PRN
Start: 2020-12-16 — End: 2020-12-16
  Administered 2020-12-16: 30 mg via INTRAVENOUS

## 2020-12-16 MED ORDER — FENTANYL CITRATE (PF) 250 MCG/5ML IJ SOLN
INTRAMUSCULAR | Status: DC | PRN
  Administered 2020-12-16 (×4): 50 ug via INTRAVENOUS

## 2020-12-16 MED ORDER — ALBUTEROL SULFATE (2.5 MG/3ML) 0.083% IN NEBU
2.5000 mg | INHALATION_SOLUTION | Freq: Once | RESPIRATORY_TRACT | Status: AC
  Administered 2020-12-16: 2.5 mg via RESPIRATORY_TRACT

## 2020-12-16 MED ORDER — SUGAMMADEX SODIUM 200 MG/2ML IV SOLN
INTRAVENOUS | Status: DC | PRN
  Administered 2020-12-16: 200 mg via INTRAVENOUS

## 2020-12-16 MED ORDER — 0.9 % SODIUM CHLORIDE (POUR BTL) OPTIME
TOPICAL | Status: DC | PRN
  Administered 2020-12-16: 200 mL

## 2020-12-16 MED ORDER — OXYCODONE HCL 5 MG/5ML PO SOLN
ORAL | Status: AC
  Administered 2020-12-16: 5 mg
  Filled 2020-12-16: qty 5

## 2020-12-16 MED ORDER — LEVOTHYROXINE SODIUM 100 MCG PO TABS
100.0000 ug | ORAL_TABLET | Freq: Every day | ORAL | Status: DC
  Administered 2020-12-17: 100 ug via ORAL
  Filled 2020-12-16: qty 1

## 2020-12-16 MED ORDER — ONDANSETRON HCL 4 MG/2ML IJ SOLN
INTRAMUSCULAR | Status: DC | PRN
  Administered 2020-12-16: 4 mg via INTRAVENOUS

## 2020-12-16 MED ORDER — OXYCODONE HCL 5 MG/5ML PO SOLN: 5.0000 mg | Freq: Once | ORAL | Status: DC

## 2020-12-16 MED ORDER — LIDOCAINE 2% (20 MG/ML) 5 ML SYRINGE
INTRAMUSCULAR | Status: DC | PRN
  Administered 2020-12-16: 100 mg via INTRAVENOUS

## 2020-12-16 MED ORDER — POLYETHYLENE GLYCOL 3350 17 G PO PACK: 17.0000 g | PACK | Freq: Every day | ORAL | Status: DC | PRN

## 2020-12-16 MED ORDER — PROPOFOL 10 MG/ML IV BOLUS
INTRAVENOUS | Status: AC
  Filled 2020-12-16: qty 20

## 2020-12-16 MED ORDER — SUCCINYLCHOLINE CHLORIDE 200 MG/10ML IV SOSY
PREFILLED_SYRINGE | INTRAVENOUS | Status: DC | PRN
  Administered 2020-12-16: 100 mg via INTRAVENOUS

## 2020-12-16 MED ORDER — DOCUSATE SODIUM 100 MG PO CAPS
100.0000 mg | ORAL_CAPSULE | Freq: Two times a day (BID) | ORAL | Status: DC
  Administered 2020-12-17: 100 mg via ORAL
  Filled 2020-12-16 (×2): qty 1

## 2020-12-16 MED ORDER — FENTANYL CITRATE (PF) 100 MCG/2ML IJ SOLN
INTRAMUSCULAR | Status: AC
  Filled 2020-12-16: qty 2

## 2020-12-16 MED ORDER — HEMOSTATIC AGENTS (NO CHARGE) OPTIME
TOPICAL | Status: DC | PRN
  Administered 2020-12-16: 1 via TOPICAL

## 2020-12-16 MED ORDER — CALCIUM CARBONATE-VITAMIN D 500-200 MG-UNIT PO TABS
2.0000 | ORAL_TABLET | Freq: Two times a day (BID) | ORAL | Status: DC
  Administered 2020-12-16 – 2020-12-17 (×2): 2 via ORAL
  Filled 2020-12-16 (×2): qty 2

## 2020-12-16 MED ORDER — PHENYLEPHRINE HCL-NACL 20-0.9 MG/250ML-% IV SOLN
INTRAVENOUS | Status: DC | PRN
  Administered 2020-12-16: 20 ug/min via INTRAVENOUS

## 2020-12-16 MED ORDER — AMISULPRIDE (ANTIEMETIC) 5 MG/2ML IV SOLN: 10.0000 mg | Freq: Once | INTRAVENOUS | Status: DC | PRN

## 2020-12-16 MED ORDER — PHENYLEPHRINE 40 MCG/ML (10ML) SYRINGE FOR IV PUSH (FOR BLOOD PRESSURE SUPPORT)
PREFILLED_SYRINGE | INTRAVENOUS | Status: DC | PRN
Start: 2020-12-16 — End: 2020-12-16
  Administered 2020-12-16: 120 ug via INTRAVENOUS
  Administered 2020-12-16: 160 ug via INTRAVENOUS
  Administered 2020-12-16: 120 ug via INTRAVENOUS

## 2020-12-16 MED ORDER — FENTANYL CITRATE (PF) 100 MCG/2ML IJ SOLN
25.0000 ug | INTRAMUSCULAR | Status: DC | PRN
  Administered 2020-12-16: 50 ug via INTRAVENOUS
  Administered 2020-12-16 (×2): 25 ug via INTRAVENOUS

## 2020-12-16 MED ORDER — ONDANSETRON HCL 4 MG PO TABS: 4.0000 mg | ORAL_TABLET | ORAL | Status: DC | PRN

## 2020-12-16 MED ORDER — OXYCODONE HCL 5 MG PO TABS: 5.0000 mg | ORAL_TABLET | Freq: Once | ORAL | Status: DC

## 2020-12-16 MED ORDER — CHLORHEXIDINE GLUCONATE 0.12 % MT SOLN
OROMUCOSAL | Status: AC
  Filled 2020-12-16: qty 15

## 2020-12-16 MED ORDER — IBUPROFEN 100 MG/5ML PO SUSP: 400.0000 mg | Freq: Four times a day (QID) | ORAL | Status: DC | PRN

## 2020-12-16 MED ORDER — PROMETHAZINE HCL 25 MG/ML IJ SOLN: 6.2500 mg | INTRAMUSCULAR | Status: DC | PRN

## 2020-12-16 MED ORDER — MIDAZOLAM HCL 2 MG/2ML IJ SOLN
INTRAMUSCULAR | Status: AC
  Filled 2020-12-16: qty 2

## 2020-12-16 MED ORDER — CEFAZOLIN SODIUM-DEXTROSE 2-4 GM/100ML-% IV SOLN
2.0000 g | INTRAVENOUS | Status: AC
  Administered 2020-12-16: 2 g via INTRAVENOUS
  Filled 2020-12-16: qty 100

## 2020-12-16 SURGICAL SUPPLY — 53 items
BAG COUNTER SPONGE SURGICOUNT (BAG) ×2 IMPLANT
BLADE SURG 15 STRL LF DISP TIS (BLADE) ×1 IMPLANT
BLADE SURG 15 STRL SS (BLADE) ×2
CANISTER SUCT 3000ML PPV (MISCELLANEOUS) ×2 IMPLANT
CLEANER TIP ELECTROSURG 2X2 (MISCELLANEOUS) IMPLANT
CNTNR URN SCR LID CUP LEK RST (MISCELLANEOUS) IMPLANT
CONT SPEC 4OZ STRL OR WHT (MISCELLANEOUS)
CORD BIPOLAR FORCEPS 12FT (ELECTRODE) ×2 IMPLANT
COVER SURGICAL LIGHT HANDLE (MISCELLANEOUS) ×2 IMPLANT
DERMABOND ADVANCED (GAUZE/BANDAGES/DRESSINGS) ×1
DERMABOND ADVANCED .7 DNX12 (GAUZE/BANDAGES/DRESSINGS) ×1 IMPLANT
DRAIN JACKSON RD 7FR 3/32 (WOUND CARE) IMPLANT
DRAIN JP 10F RND RADIO (DRAIN) ×2 IMPLANT
DRAIN SNY 10 ROU (WOUND CARE) IMPLANT
DRAPE HALF SHEET 40X57 (DRAPES) ×2 IMPLANT
ELECT COATED BLADE 2.86 ST (ELECTRODE) ×2 IMPLANT
ELECT REM PT RETURN 9FT ADLT (ELECTROSURGICAL) ×2
ELECTRODE REM PT RTRN 9FT ADLT (ELECTROSURGICAL) ×1 IMPLANT
EVACUATOR SILICONE 100CC (DRAIN) ×2 IMPLANT
FORCEPS BIPOLAR SPETZLER 8 1.0 (NEUROSURGERY SUPPLIES) ×2 IMPLANT
GAUZE 4X4 16PLY ~~LOC~~+RFID DBL (SPONGE) ×2 IMPLANT
GLOVE SURG ENC MOIS LTX SZ6.5 (GLOVE) ×2 IMPLANT
GLOVE SURG ENC MOIS LTX SZ7.5 (GLOVE) ×2 IMPLANT
GOWN STRL REUS W/ TWL LRG LVL3 (GOWN DISPOSABLE) ×4 IMPLANT
GOWN STRL REUS W/TWL LRG LVL3 (GOWN DISPOSABLE) ×8
HEMOSTAT ARISTA ABSORB 3G PWDR (HEMOSTASIS) ×2 IMPLANT
HEMOSTAT SURGICEL 2X14 (HEMOSTASIS) IMPLANT
KIT BASIN OR (CUSTOM PROCEDURE TRAY) ×2 IMPLANT
KIT TURNOVER KIT B (KITS) ×2 IMPLANT
LOCATOR NERVE 3 VOLT (DISPOSABLE) IMPLANT
NEEDLE HYPO 25GX1X1/2 BEV (NEEDLE) ×2 IMPLANT
NS IRRIG 1000ML POUR BTL (IV SOLUTION) ×2 IMPLANT
PAD ARMBOARD 7.5X6 YLW CONV (MISCELLANEOUS) ×4 IMPLANT
PENCIL SMOKE EVACUATOR (MISCELLANEOUS) ×2 IMPLANT
POSITIONER HEAD DONUT 9IN (MISCELLANEOUS) IMPLANT
SET WALTER ACTIVATION W/DRAPE (SET/KITS/TRAYS/PACK) ×2 IMPLANT
SHEARS HARMONIC 9CM CVD (BLADE) ×2 IMPLANT
SPONGE INTESTINAL PEANUT (DISPOSABLE) ×4 IMPLANT
STAPLER VISISTAT 35W (STAPLE) ×2 IMPLANT
STRIP CLOSURE SKIN 1/2X4 (GAUZE/BANDAGES/DRESSINGS) ×2 IMPLANT
SUT CHROMIC 4 0 PS 2 18 (SUTURE) ×2 IMPLANT
SUT ETHILON 2 0 FS 18 (SUTURE) IMPLANT
SUT MNCRL AB 4-0 PS2 18 (SUTURE) ×2 IMPLANT
SUT SILK 3 0 PS 1 (SUTURE) ×2 IMPLANT
SUT SILK 3 0 REEL (SUTURE) ×2 IMPLANT
SUT SILK 3 0SH CR/8 30 (SUTURE) ×2 IMPLANT
SUT VIC AB 3-0 SH 27 (SUTURE) ×2
SUT VIC AB 3-0 SH 27X BRD (SUTURE) ×1 IMPLANT
SUT VICRYL 4-0 PS2 18IN ABS (SUTURE) ×2 IMPLANT
TOWEL GREEN STERILE FF (TOWEL DISPOSABLE) ×2 IMPLANT
TRAY ENT MC OR (CUSTOM PROCEDURE TRAY) ×2 IMPLANT
TUBE ENDOTRAC EMG 7X10.2 (MISCELLANEOUS) ×2 IMPLANT
TUBE FEEDING 10FR FLEXIFLO (MISCELLANEOUS) IMPLANT

## 2020-12-16 NOTE — Anesthesia Procedure Notes (Signed)
Procedure Name: Intubation Date/Time: 12/16/2020 8:45 AM Performed by: Griffin Dakin, CRNA Pre-anesthesia Checklist: Patient identified, Emergency Drugs available, Suction available and Patient being monitored Patient Re-evaluated:Patient Re-evaluated prior to induction Oxygen Delivery Method: Circle system utilized Preoxygenation: Pre-oxygenation with 100% oxygen Induction Type: IV induction Ventilation: Mask ventilation without difficulty Laryngoscope Size: Mac and 4 Grade View: Grade II Tube type: Oral (NIMs) Number of attempts: 1 Airway Equipment and Method: Stylet and Oral airway Placement Confirmation: ETT inserted through vocal cords under direct vision, positive ETCO2 and breath sounds checked- equal and bilateral Secured at: 21 cm Tube secured with: Tape Dental Injury: Teeth and Oropharynx as per pre-operative assessment

## 2020-12-16 NOTE — H&P (Signed)
Paula Carrillo is an 65 y.o. female.    Chief Complaint:  Thyroid nodule suspicious for malignancy  HPI: Patient presents today for planned elective procedure.  She denies any interval change in history since office visit on 10/16/2020:  Paula Carrillo is a 65 y.o. female who presents as a return patient, referred by Tildon Husky,*, for discussion of treatment options following FNA of thyroid isthmus nodule. Patient has 4.2 x 4.0 x 2.0cm isthmus nodule, which was suspicious for maligancy on FNA performed on 10/08/2020. Patient continues to smoke on daily basis. She reports history of thyroidectomy in her mother, but is unsure if she had thyroid cancer.No history of radiation exposure.  Past Medical History:  Diagnosis Date   Arthritis    Asthma    Cancer ([facility])    Chronic abdominal pain    Chronic back pain    Chronic neck pain    Chronic pain    Colon cancer ([facility])    COPD (chronic obstructive pulmonary disease) (HCC)    Hemorrhoids    IBS (irritable bowel syndrome)    Migraines    Thyroid disease     Past Surgical History:  Procedure Laterality Date   ABDOMINAL HYSTERECTOMY     APPENDECTOMY     BREAST LUMPECTOMY     pt denies this   COLON RESECTION     COLONOSCOPY  07/17/2009   VOJ:JKKXF internal hemorrhoids/tortuous colon/3-mm sessile hepatic polyp/sigmoid colon diverticula, small internal hemorrhoids, path: tubular adenoma   COLONOSCOPY N/A 07/23/2012   GHW:EXHBZJ polyp in the ascending colon/Two sessile polyps in the rectum/RECTAL BLEEDING DUE TO Large internal hemorrhoids-S/P BANDINGx3    Family History  Problem Relation Age of Onset   Colon cancer Neg Hx    Hypertension Mother    CVA Mother    Parkinson's disease Mother    Emphysema Father     Social History:  reports that she has been smoking cigarettes. She has been smoking an average of 1 pack per day. She quit smokeless tobacco use about 46 years ago. She reports that she does not drink alcohol  and does not use drugs.  Allergies:  Allergies  Allergen Reactions   Sulfa Antibiotics Hives and Itching    Medications Prior to Admission  Medication Sig Dispense Refill   acetaminophen (TYLENOL) 650 MG CR tablet Take 1,300 mg by mouth every 8 (eight) hours as needed for pain.     albuterol (PROVENTIL HFA;VENTOLIN HFA) 108 (90 BASE) MCG/ACT inhaler Inhale 2 puffs into the lungs every 6 (six) hours as needed for shortness of breath.     albuterol (PROVENTIL) (2.5 MG/3ML) 0.083% nebulizer solution Take 3 mLs (2.5 mg total) by nebulization every 4 (four) hours as needed for wheezing or shortness of breath.     Budeson-Glycopyrrol-Formoterol (BREZTRI AEROSPHERE) 160-9-4.8 MCG/ACT AERO Inhale 2 puffs into the lungs 2 (two) times daily. 10.7 g 11   Calcium Carb-Cholecalciferol (SUPER CALCIUM 600 + D 400 PO) Take 1 tablet by mouth daily.     famotidine (PEPCID) 20 MG tablet One after supper (Patient taking differently: Take 20 mg by mouth daily.) 30 tablet 11   fluticasone (FLONASE) 50 MCG/ACT nasal spray Place 1 spray into both nostrils daily as needed for allergies or rhinitis.     HYDROcodone-acetaminophen (NORCO/VICODIN) 5-325 MG tablet Take 1 tablet by mouth every 6 (six) hours as needed for moderate pain. 20 tablet 0   montelukast (SINGULAIR) 10 MG tablet Take 10 mg by mouth at bedtime.  naproxen sodium (ALEVE) 220 MG tablet Take 220 mg by mouth 2 (two) times daily as needed (pain).     ondansetron (ZOFRAN ODT) 4 MG disintegrating tablet 4mg  ODT q4 hours prn nausea/vomit 12 tablet 0   pantoprazole (PROTONIX) 40 MG tablet Take 1 tablet (40 mg total) by mouth daily. Take 30-60 min before first meal of the day 30 tablet 2   predniSONE (DELTASONE) 10 MG tablet Take 10 mg by mouth daily.     vitamin B-12 (CYANOCOBALAMIN) 500 MCG tablet Take 500 mcg by mouth daily.     vitamin C (ASCORBIC ACID) 500 MG tablet Take 500 mg by mouth daily.      Results for orders placed or performed during the  hospital encounter of 12/14/20 (from the past 48 hour(s))  SARS CORONAVIRUS 2 (TAT 6-24 HRS) Nasopharyngeal Nasopharyngeal Swab     Status: None   Collection Time: 12/14/20 10:53 AM   Specimen: Nasopharyngeal Swab  Result Value Ref Range   SARS Coronavirus 2 NEGATIVE NEGATIVE    Comment: (NOTE) SARS-CoV-2 target nucleic acids are NOT DETECTED.  The SARS-CoV-2 RNA is generally detectable in upper and lower respiratory specimens during the acute phase of infection. Negative results do not preclude SARS-CoV-2 infection, do not rule out co-infections with other pathogens, and should not be used as the sole basis for treatment or other patient management decisions. Negative results must be combined with clinical observations, patient history, and epidemiological information. The expected result is Negative.  Fact Sheet for Patients: SugarRoll.be  Fact Sheet for Healthcare Providers: https://www.woods-mathews.com/  This test is not yet approved or cleared by the [place] FDA and  has been authorized for detection and/or diagnosis of SARS-CoV-2 by FDA under an Emergency Use Authorization (EUA). This EUA will remain  in effect (meaning this test can be used) for the duration of the COVID-19 declaration under Se ction 564(b)(1) of the Act, 21 U.S.C. section 360bbb-3(b)(1), unless the authorization is terminated or revoked sooner.  Performed at [facility], [place] [address] [place], [place] [address]   CBC per protocol     Status: None   Collection Time: 12/14/20 11:30 AM  Result Value Ref Range   WBC 8.5 4.0 - 10.5 K/uL   RBC 4.65 3.87 - 5.11 MIL/uL   Hemoglobin 14.1 12.0 - 15.0 g/dL   HCT 44.1 36.0 - 46.0 %   MCV 94.8 80.0 - 100.0 fL   MCH 30.3 26.0 - 34.0 pg   MCHC 32.0 30.0 - 36.0 g/dL   RDW 12.8 11.5 - 15.5 %   Platelets 343 150 - 400 K/uL   nRBC 0.0 0.0 - 0.2 %    Comment: Performed at [facility], [place] [address] [place], [place] [address]   No results found.  ROS: ROS  Blood pressure 123/63, pulse 62, temperature 98.6 F (37 C), temperature source Oral, resp. rate 17, height 5\' 4"  (1.626 m), weight 62.1 kg, SpO2 97 %.  PHYSICAL EXAM: General: No acute distress. Voice hoarse, no voice breaks  Head/Face: Normocephalic, atraumatic. No scars or lesions. No sinus tenderness. Facial nerve intact and equal bilaterally.  Eyes: Pupils are equal, round and reactive to light. Conjunctiva and lids are normal. Normal extraocular mobility.  Nose: No gross deformity or lesions.  Neck: Trachea midline. No masses. No crepitus. Isthmus nodule palpated. Salivary glands normal to palpation without swelling, erythema or mass.  Lymphatic: No lymphadenopathy in the neck.  Respiratory: No stridor or distress.  Extremities: No edema or cyanosis. Warm and well-perfused.  Neurologic: CN II-XII intact. Alert and oriented to self, place and time. Normal reflexes and motor skills, balance and coordination. Moving all extremities without gross abnormality.  Psychiatric: No unusual anxiety or evidence of depression. Appropriate affect.  Studies Reviewed: Cytology results, Thyroid US   Assessment/Plan Paula Carrillo is a 65 y.o. female with extensive tobacco use history with 4cm TIRADS 3 isthmus nodule, suspicious for malignancy on FNA, and subcentimeter thyroid nodules in both thyroid lobes. To OR for total thyroidectomy. Risks of surgery including recurrent laryngeal nerve injury and hyoparathyroidism were reviewed. The patient understands the risks, and all questions were answered to the patient's satisfaction. Patient expressed understanding and agreement to proceed with planned surgical procedure.    Paula Carrillo 12/16/2020, 8:26 AM

## 2020-12-16 NOTE — Progress Notes (Signed)
ENT Post Operative Note  Subjective: Patient seen and examined, up in chair, resting comfortably. Reports pain controlled. States she is hungry, awaiting her tray.   Vitals:   12/16/20 1705 12/16/20 1742  BP: 120/74 122/72  Pulse: 64 71  Resp: 14 16  Temp:  98.6 F (37 C)  SpO2: 95% 96%    OBJECTIVE  Gen: alert, cooperative, appropriate Head/ENT: EOMI, mucus membranes moist and pink, conjunctiva clear Central neck incision C/D/I with Steristrips intact. Neck soft, with no evidence of seroma or hematoma. JP drain exiting left neck with sanguinous drainage. Respiratory: Voice without dysphonia. Non-labored breathing, no accessory muscle use, good O2 saturations on room air Neuro: CN II-XII grossly intact   ASSESSMENT/ PLAN Paula Carrillo is a 65 y.o. female who is POD 0 from total thyroidectomy.  -Continue observation. Anticipate discharge tomorrow if labs stable. Will remove JP drain prior to DC.  -Continue JP drain to bulb suction. Empty, record output, and recharge every 6 hours. -Continue MIVF; will saline lock when tolerating adequate PO intake -Encourage IS use, ambulation to tolerance with assist -Advance diet as tolerated -Pain control -Ca/PTH pending; Mg wnl  -Oscal 500mg  BID  Thank you for allowing me to participate in the care of this patient. Please do not hesitate to contact me with any questions or concerns.   Jason Coop, [facility] Cell: [phone]

## 2020-12-16 NOTE — Progress Notes (Signed)
Pacu RN Report to floor given  Gave report to [facility]. 6N10. Discussed surgery, meds given in OR and Pacu, VS, IV fluids given, EBL, urine output, pain and other pertinent information. Also discussed if pt had any family or friends here or belongings with them.   Labs sent while in Pacu. Pt swallowing fine, just very painful. Niece will go up to room to see pt.   Pt exits my care.

## 2020-12-16 NOTE — Op Note (Signed)
OPERATIVE NOTE  LLUVIA [facility]YNNE Date/Time of Admission: 12/16/2020  5:51 AM  CSN: [id number];KYH:[id number] Attending Provider: Ebbie Latus A, DO Room/Bed: MCPO/NONE DOB: 1956-02-16 Age: 65 y.o.   Pre-Op Diagnosis: Thyroid nodule  Post-Op Diagnosis: Thyroid nodule  Procedure: Procedure(s): TOTAL THYROIDECTOMY  Anesthesia: General  Surgeon(s): Jason Coop, DO  Assistant: Melida Quitter, MD  Staff: Circulator: Burroughs, Lisbeth Renshaw, RN; [place] Crane, Amy T, RN Scrub Person: Melody Comas, RN; Dollene Cleveland T Vendor Representative : Roseanne Kaufman  Implants: * No implants in log *  Specimens: ID Type Source Tests Collected by Time Destination  1 : Thyroid--stitch superior Tissue PATH ENT excision SURGICAL PATHOLOGY Jalacia Mattila A, DO 15/03/7614 0737     Complications: None  EBL: 30 ML  Condition: stable  Operative Findings: 2 of  the 4 parathyroid gland were identified and  were nerurovascularly intact. The bilateral recurrent laryngeal nerves were intact to visual examination and electrical stimulation at 0.4  mA on the right and 0.4  mA on the left.  Description of Operation: The patient was identified in the preoperative holding area.  Informed written consent including risks, benefits, alternatives, and possible complications including bleeding and nerve injury were obtained.  Patient was then taken, under the care of anesthesia, to the operating suite.  Patient was placed in the supine position on the operating table and then general anesthesia was administered using a NIMS endotracheal tube per anesthesia's protocol.  Incision site on anterior neck was marked and infiltrated with 10cc Lidocaine 1% with 1:100,000 epinephrine.  A transverse incision, approximately 5cm in length was made within a skin crease with a 15 blade scalpel. The incision was carried down through the platysma. Inferiorly and superiorly based subplatysmal flaps were raised to the level of  the clavicle and thyroid cartilage, respectively.  Four 2-0 silk sutures were used to retract the skin flaps at each corner of the incision. The midline raphe of the strap muscles was then identified and separated with the Bovie electrocautery in anatomic midline. The strap muscles were then dissected from the left  thyroid capsule. The strap muscles were retracted laterally. The superior pole vasculature was then identified and surrounding tissue was dissected free using both sharp and blunt instrumentation.  The superior pole vasculature was then clamped, cut, and ligated using Harmonic Scalpel.  The inferior pole was then identified and surrounding tissue were dissected free using both sharp and blunt instrumentation.  The inferior pole vessels were then clamped, cut, and ligated using Harmonic Scalpel. The thyroid was then retracted medially.  The middle thyroid vein was identified and ligated.  The recurrent laryngeal was then identified in the tracheoesophageal groove.  The nerve integrity monitoring probe was then used to stimulate the recurrent laryngeal nerve to ensure its identification and integrity.  The nerve was carefully dissected superiorly until it entered the larynx.  The left   thyroid was removed from the anterior tracheal wall through Berry's Ligament. Similar procedure was then performed on the right. The thyroid was then passed off the field to be evaluated by pathology. Spot bipolar electrocautery was used to achieve meticulous hemostasis, and the dissected nerve was once again stimulated and found to be functional.  The wound bed was copiously irrigated and once again inspected for any bleeding.  There was none.  Arista was placed in the wound bed.  A 10 french JP drain was passed through a separate stab incision lateral to the surgical site and sutured in place using 2-0 silk  suture. The strap muscles were then reapproximated using a 3-0 chromic suture in a running, locking fashion. The  incision was then closed in a layered fashion. The platysma was closed with interrupted 3-0 Vicryl sutures.  The subcutaneous tissue was then closed with buried interrupted 4-0 vicryl sutures.  4-0 Monocryl was then used to approximate the epidermis. The skin was then closed with Dermabond.  Finally, Steristrips were placed over the incision. The patient was returned to the care of anesthesia.   Assistance was required throughout the surgical procedure including surgical planning, retraction, management of bleeding and surgical decision-making throughout the operation.    Jason Coop, [facility]  12/16/2020

## 2020-12-16 NOTE — Anesthesia Postprocedure Evaluation (Signed)
Anesthesia Post Note  Patient: Paula Carrillo  Procedure(s) Performed: TOTAL THYROIDECTOMY (Neck)     Patient location during evaluation: PACU Anesthesia Type: General Level of consciousness: sedated Pain management: pain level controlled Vital Signs Assessment: post-procedure vital signs reviewed and stable Respiratory status: spontaneous breathing and respiratory function stable Cardiovascular status: stable Postop Assessment: no apparent nausea or vomiting Anesthetic complications: no   No notable events documented.  Last Vitals:  Vitals:   12/16/20 1335 12/16/20 1445  BP: (!) 101/59   Pulse: 84 82  Resp: 15 14  Temp:    SpO2: 96% 98%    Last Pain:  Vitals:   12/16/20 1445  TempSrc:   PainSc: 6                  Paula Carrillo

## 2020-12-16 NOTE — Transfer of Care (Signed)
Immediate Anesthesia Transfer of Care Note  Patient: Paula Carrillo  Procedure(s) Performed: TOTAL THYROIDECTOMY (Neck)  Patient Location: PACU  Anesthesia Type:General  Level of Consciousness: awake, alert  and oriented  Airway & Oxygen Therapy: Patient Spontanous Breathing and Patient connected to face mask oxygen  Post-op Assessment: Report given to RN and Post -op Vital signs reviewed and stable  Post vital signs: Reviewed and stable  Last Vitals:  Vitals Value Taken Time  BP 127/63 12/16/20 1211  Temp    Pulse 83 12/16/20 1214  Resp 31 12/16/20 1214  SpO2 99 % 12/16/20 1214  Vitals shown include unvalidated device data.  Last Pain:  Vitals:   12/16/20 0732  TempSrc:   PainSc: 0-No pain         Complications: No notable events documented.

## 2020-12-17 ENCOUNTER — Other Ambulatory Visit (HOSPITAL_COMMUNITY): Payer: Self-pay

## 2020-12-17 ENCOUNTER — Encounter (HOSPITAL_COMMUNITY): Payer: Self-pay | Admitting: Otolaryngology

## 2020-12-17 DIAGNOSIS — C73 Malignant neoplasm of thyroid gland: Secondary | ICD-10-CM | POA: Diagnosis not present

## 2020-12-17 LAB — CALCIUM: Calcium: 8.2 mg/dL — ABNORMAL LOW (ref 8.9–10.3)

## 2020-12-17 LAB — PTH, INTACT AND CALCIUM
Calcium, Total (PTH): 8.6 mg/dL — ABNORMAL LOW (ref 8.7–10.3)
PTH: 8 pg/mL — ABNORMAL LOW (ref 15–65)

## 2020-12-17 LAB — ALBUMIN: Albumin: 2.8 g/dL — ABNORMAL LOW (ref 3.5–5.0)

## 2020-12-17 MED ORDER — LEVOTHYROXINE SODIUM 100 MCG PO TABS
100.0000 ug | ORAL_TABLET | Freq: Every day | ORAL | 2 refills | Status: DC
  Filled 2020-12-17: qty 30, 30d supply, fill #0

## 2020-12-17 MED ORDER — CALCIUM CARBONATE-VITAMIN D3 600-400 MG-UNIT PO TABS
2.0000 | ORAL_TABLET | Freq: Two times a day (BID) | ORAL | 2 refills | Status: DC
  Filled 2020-12-17: qty 120, 30d supply, fill #0

## 2020-12-17 MED ORDER — DOCUSATE SODIUM 100 MG PO CAPS
100.0000 mg | ORAL_CAPSULE | Freq: Two times a day (BID) | ORAL | 0 refills | Status: DC
  Filled 2020-12-17: qty 10, 5d supply, fill #0

## 2020-12-17 MED ORDER — HYDROCODONE-ACETAMINOPHEN 5-325 MG PO TABS
1.0000 | ORAL_TABLET | Freq: Four times a day (QID) | ORAL | 0 refills | Status: AC | PRN
  Filled 2020-12-17: qty 28, 7d supply, fill #0

## 2020-12-17 NOTE — Progress Notes (Signed)
Paula Carrillo to be D/C'd per MD order. Discussed with the patient and all questions fully answered. ? VSS, Skin clean, dry and intact without evidence of skin break down, no evidence of skin tears noted. ? IV catheter discontinued intact. Site without signs and symptoms of complications. Dressing and pressure applied. ? An After Visit Summary was printed and given to the patient. Patient informed where to pickup prescriptions. ? D/c education completed with patient/family including follow up instructions, medication list, d/c activities limitations if indicated, with other d/c instructions as indicated by MD - patient able to verbalize understanding, all questions fully answered.  ? Patient instructed to return to ED, call 911, or call MD for any changes in condition.  ? Patient to be escorted via [place], and D/C home via private auto.

## 2020-12-17 NOTE — Discharge Summary (Signed)
Physician Discharge Summary  Patient ID: Paula Carrillo MRN: [id number] DOB/AGE: [birth year] 65 y.o.  Admit date: 12/16/2020 Discharge date: 12/17/2020  Admission Diagnoses:  Active Problems:   Thyroid nodule   Discharge Diagnoses:  Same  Surgeries: Procedure(s): TOTAL THYROIDECTOMY on 12/16/2020   Consultants: None  Discharged Condition: Improved  Hospital Course: Paula Carrillo is an 66 y.o. female who was admitted 12/16/2020 with a chief complaint/ diagnosis of thyroid nodule. She was brought to the operating room on 12/16/2020 and underwent the above named procedures without complication. She was admitted for routine observation. On POD #1, she was doing well, ambulating, and tolerating a regular diet. Drain was removed and patient was deemed stable for discharge.   Physical Exam:  General: Awake and alert, no acute distress Neck: Incision C/D/I with steristrips in place. Neck soft, with no evidence of seroma or hematoma. JP drain exiting with 5cc serosanguinous drainage.  Respiratory: Respiratory effort is normal on room air.  Recent vital signs:  Vitals:   12/17/20 0755 12/17/20 1129  BP: (!) 130/109 119/72  Pulse: 63 69  Resp: 20 20  Temp: 98.2 F (36.8 C) 98.5 F (36.9 C)  SpO2: 98% 98%    Recent laboratory studies:  Results for orders placed or performed during the hospital encounter of 12/16/20  Albumin  Result Value Ref Range   Albumin 3.2 (L) 3.5 - 5.0 g/dL  Magnesium  Result Value Ref Range   Magnesium 1.7 1.7 - 2.4 mg/dL  PTH, intact and calcium  Result Value Ref Range   PTH 8 (L) 15 - 65 pg/mL   Calcium, Total (PTH) 8.6 (L) 8.7 - 10.3 mg/dL   PTH Interp Comment   Calcium  Result Value Ref Range   Calcium 8.2 (L) 8.9 - 10.3 mg/dL  Calcium  Result Value Ref Range   Calcium 8.2 (L) 8.9 - 10.3 mg/dL  Albumin  Result Value Ref Range   Albumin 3.2 (L) 3.5 - 5.0 g/dL  Albumin  Result Value Ref Range   Albumin 2.8 (L) 3.5 - 5.0 g/dL     Discharge Medications:   Allergies as of 12/17/2020       Reactions   Sulfa Antibiotics Hives, Itching        Medication List     STOP taking these medications    acetaminophen 650 MG CR tablet Commonly known as: TYLENOL   SUPER CALCIUM 600 + D 400 PO       TAKE these medications    albuterol 108 (90 Base) MCG/ACT inhaler Commonly known as: VENTOLIN HFA Inhale 2 puffs into the lungs every 6 (six) hours as needed for shortness of breath.   albuterol (2.5 MG/3ML) 0.083% nebulizer solution Commonly known as: PROVENTIL Take 3 mLs (2.5 mg total) by nebulization every 4 (four) hours as needed for wheezing or shortness of breath.   Breztri Aerosphere 160-9-4.8 MCG/ACT Aero Generic drug: Budeson-Glycopyrrol-Formoterol Inhale 2 puffs into the lungs 2 (two) times daily.   Calcium Citrate-Vitamin D 500-333 MG-UNIT Chew Chew 2 tablets by mouth in the morning and at bedtime.   docusate sodium 100 MG capsule Commonly known as: COLACE Take 1 capsule (100 mg total) by mouth 2 (two) times daily.   famotidine 20 MG tablet Commonly known as: Pepcid One after supper What changed:  how much to take how to take this when to take this additional instructions   fluticasone 50 MCG/ACT nasal spray Commonly known as: FLONASE Place 1 spray into both nostrils daily as  needed for allergies or rhinitis.   HYDROcodone-acetaminophen 5-325 MG tablet Commonly known as: NORCO/VICODIN Take 1 tablet by mouth every 6 (six) hours as needed for up to 7 days for moderate pain.   levothyroxine 100 MCG tablet Commonly known as: SYNTHROID Take 1 tablet (100 mcg total) by mouth daily at 6 (six) AM. Start taking on: December 18, 2020   montelukast 10 MG tablet Commonly known as: SINGULAIR Take 10 mg by mouth at bedtime.   naproxen sodium 220 MG tablet Commonly known as: ALEVE Take 220 mg by mouth 2 (two) times daily as needed (pain).   ondansetron 4 MG disintegrating tablet Commonly  known as: Zofran ODT 4mg  ODT q4 hours prn nausea/vomit   pantoprazole 40 MG tablet Commonly known as: Protonix Take 1 tablet (40 mg total) by mouth daily. Take 30-60 min before first meal of the day   predniSONE 10 MG tablet Commonly known as: DELTASONE Take 10 mg by mouth daily.   vitamin B-12 500 MCG tablet Commonly known as: CYANOCOBALAMIN Take 500 mcg by mouth daily.   vitamin C 500 MG tablet Commonly known as: ASCORBIC ACID Take 500 mg by mouth daily.               Discharge Care Instructions  (From admission, onward)           Start     Ordered   12/17/20 0000  Leave dressing on - Keep it clean, dry, and intact until clinic visit        12/17/20 1322            Diagnostic Studies: No results found.  Disposition: Discharge disposition: 01-Home or Self Care       Discharge Instructions     Leave dressing on - Keep it clean, dry, and intact until clinic visit   Complete by: As directed         Follow-up Information     Jhoanna Heyde, [place], DO. Go on 12/30/2020.   Specialty: Otolaryngology Why: Follow up as scheduled on 10/19 Contact information: [address] [place] [id number] [phone]                  Signed: [place] 12/17/2020, 1:23 PM

## 2020-12-17 NOTE — Discharge Instructions (Signed)
[facility] THYROID / PARATHYROID SURGERY Post Operative Instructions Office Number [phone]  The Surgery Itself Thyroid surgery involves general anesthesia. Patients may be quite sedated for  several hours after surgery and may remain sleepy for much of the day. Nausea and vomiting is  occasionally seen, and usually resolves by the evening of surgery - even without additional  medications. Some patients stay one night in the hospital and are discharged the next day. Other  patients can go home the evening of surgery. Many patients will have a drain in place after  surgery-this is removed the following day before you go home from the hospital or in the office 1-3  days after surgery.   Your Incision Your incision is closed with absorbable sutures and is covered with a small strip of tape or skin  glue. You can shower and wash your hair as usual starting 24-48 hours after your drain is  removed, as directed by your surgeon. If you did not have a drain in place after surgery, you may  shower 24 hours after surgery. You may wash in a bathtub prior to that time if you are careful not  to get your neck wet. Do not soak or scrub the incision. You might notice bruising around your  incision or upper chest and slight swelling above the scar when you are upright. In addition, the  scar may become pink and hard. This hardening will peak at about 3 weeks and may result in  some tightness or difficulty swallowing, which will disappear over the next 2 to 3 months. You  should apply sunscreen on your incision once directed by your surgeon (usually starting one month  after surgery) EVERY day for the first year after surgery. This will prevent a red or pink scar and  give you the best cosmetic result for your scar. A daily moisturizer with sunscreen (example Oil of  Olay with SPF 15) is fine.   Limitations You can start resuming normal activities as tolerated 7 days after surgery. For some  patients, lifting  can cause pain and stretching at the surgery site for up to 2-3 weeks after surgery.  You should not drive or drink alcohol while taking pain medications. Most people can return to  work/school in 1-2 weeks after surgery, but there may be physical limitations as far as what you  may do while at work.   Medications ? Pain medication should be used for pain as prescribed. Pain is expected after surgery. Your  neck will be sore and pain will be worse when the neck is stretched and when you swallow.  As the surgical site heals, pain will resolve over the course of a week. It is not uncommon  for pain to get worse when you first go home because your activity may increase, but from  that point on the pain should improve every day. Pain medications can cause nausea,  which can be prevented if you take them with food or milk. ? You may be given a stool softener (Colace) because pain medications may make you  constipated. You can also use an over the counter stool softener.  ? You may be given Bacitracin ointment. If you are, it should be applied to the drain exit site  three times a day for 2 days after the drain is removed. It should also be applied to the  drain site before and after your first shower after surgery. It is normal to have some red  or  pink drainage from your drain exit site for 1-2 days after it is removed. ? If you were taking thyroid hormone tablets before your operation, you will continue this after  surgery, but sometimes your surgeon will change the dose. If you were not taking thyroid  hormone prior to your operation, your surgeon may prescribe these tablets following  surgery if the entire thyroid is removed. During your post-operative visits, you may have a  blood test to measure your levels of thyroid hormone and your dose of medication may be  adjusted accordingly. ? If you had parathyroid surgery or a total thyroidectomy, you may be instructed to take  extra  calcium supplements until your blood calcium levels stabilize. These usually have to be  purchased "over the counter" at a drug store and your surgeon will give you specific  instructions. Generic brands are fine. Calcium carbonate with Vitamin D or TUMS are  usually recommended. If you take any medications for gastric reflux (heartburn), you may  be instructed to take Calcium Citrate with vitamin D instead of the other types of calcium.  Your surgeon will instruct you on which type of calcium supplement to purchase and how  many tablets to take each day after surgery. ? Take all of your routine medications as prescribed, unless told otherwise by your surgeon.  Any medications which thin the blood should be avoided until your surgeon tells you to  resume them.  ? IT IS OK TO TAKE OVER THE COUNTER PAIN MEDICATION  (IBUPROFEN, NAPROXEN, or ACETAMINOPHEN) IN  ADDITION TO YOUR PRESCRIBED MEDICATIONS. DO NOT  TAKE ASPIRIN UNLESS CLEARED WITH YOUR SURGEON.  ? Limit Acetaminophen/Tylenol to less than 4,000mg /day  ? Limit Ibuprofen/Motrin to less than 3,600mg /day  Pain The main complaint following thyroid surgery is pain with swallowing and neck movement. Some  people experience a dull ache, while others feel a sharp pain. This should not keep you from eating  anything you want or moving your neck and will improve daily after surgery. It is normal to have a  sore throat as well.   Voice Your voice may go through some temporary changes with fluctuations in volume and clarity  (hoarseness). Generally, it will be better in the mornings and "tire" toward the end of the day. This  can last for variable periods of time, but should clear in 8-10 weeks at most.   Cough You may feel like you have phlegm in your throat or a sore throat. This is usually because there  was a tube in your windpipe while you were asleep that caused irritation that you perceive as  phlegm. You will notice that if you  cough, very little phlegm will come up. This should clear up in 4  to 5 days.  Hypocalcemia (Low blood calcium) In some patients who have thyroid and parathyroid surgery, the parathyroid glands do not function  properly immediately after surgery. This is usually temporary and causes the blood calcium level to  drop below normal (hypocalcemia). Symptoms of hypocalcemia include numbness and tingling of  your lips (like they fell asleep), in your hands and in your feet. Some patients experience a  "crawling" sensation in the skin, muscle cramps or headaches. These symptoms can appear  between 24 and 72 hours after surgery. It is rare for them to start more than 72 hours after surgery.  If this happens, take 2 extra calcium tablets. If the symptoms do not resolve within one hour, you  should  call your doctor. If this happens during the business day, call the nurse triage line. If this  happens in the evening or over the weekend, call the on call physician or go to the ER so your  blood calcium levels can be checked. You can take extra calcium supplements (which will help to  resolve symptoms) as you are contacting the clinic or coming to the ER. Taking extra calcium  supplements if you do not need them will not cause you any harm.  Reasons to call your surgeon's office ? Persistent fever over 101 F ? Increasing neck swelling ? Numbness or tingling around your mouth or fingertips  ? Pain that is not relieved by your medications ? Purulent drainage (pus) from the incision ? Redness surrounding the incision that is worsening or getting bigger ? Bleeding is possible after surgery and the most serious cases may cause trouble breathing.  Symptoms include rapid swelling in the neck, trouble breathing, red and purple discoloration of  the skin over the incision. If breathing difficulty occurs with this rapid neck swelling, call the  doctor immediately, go to the closest emergency room or call 911.

## 2020-12-18 LAB — PARATHYROID HORMONE, INTACT (NO CA): PTH: 7 pg/mL — ABNORMAL LOW (ref 15–65)

## 2020-12-18 LAB — SURGICAL PATHOLOGY

## 2021-01-06 NOTE — Pre-Procedure Instructions (Addendum)
Surgical Instructions                 Your procedure is scheduled on Monday, November 7th.             Report to [facility]tr[facility] "A" at 12:00 P.M.., then check in with the Admitting office.             Call this number if you have problems the morning of surgery:             [phone]    If you have any questions prior to your surgery date call [phone]: Open Monday-Friday 8am-4pm                 Remember:             Do not eat after midnight the night before your surgery  You may drink clear liquids until 11:00 a.m. the morning of your surgery.   Clear liquids allowed are: Water, Non-Citrus Juices (without pulp), Carbonated Beverages, Clear Tea, Black Coffee Only, and Gatorade                            Take these medicines the morning of surgery with A SIP OF WATER  Budeson-Glycopyrrol-Formoterol (BREZTRI AEROSPHERE)  levothyroxine (SYNTHROID)  pantoprazole (PROTONIX) predniSONE (DELTASONE)    As needed: acetaminophen (TYLENOL) Albuterol inhaler-please bring with you to the hospital Albuterol nebulizer fluticasone (FLONASE) ondansetron (ZOFRAN ODT)   As of today, STOP taking any Aspirin (unless otherwise instructed by your surgeon) Aleve, Naproxen, Ibuprofen, Motrin, Advil, Goody's, BC's, all herbal medications, fish oil, and all vitamins.                     Do NOT Smoke (Tobacco/Vaping) or drink Alcohol 24 hours prior to your procedure.   If you use a CPAP at night, you may bring all equipment for your overnight stay.   Contacts, glasses, piercing's, hearing aid's, dentures or partials may not be worn into surgery, please bring cases for these belongings.    For patients admitted to the hospital, discharge time will be determined by your treatment team.   Patients discharged the day of surgery will not be allowed to drive home, and someone needs to stay with them for 24 hours.   NO VISITORS WILL BE ALLOWED IN PRE-OP WHERE PATIENTS GET READY FOR  SURGERY.  ONLY 1 SUPPORT PERSON MAY BE PRESENT IN THE WAITING ROOM WHILE YOU ARE IN SURGERY.  IF YOU ARE TO BE ADMITTED, ONCE YOU ARE IN YOUR ROOM YOU WILL BE ALLOWED TWO (2) VISITORS.  Minor children may have two parents present. Special consideration for safety and communication needs will be reviewed on a case by case basis.     Special instructions:   [facility]- Preparing For Surgery   Before surgery, you can play an important role. Because skin is not sterile, your skin needs to be as free of germs as possible. You can reduce the number of germs on your skin by washing with CHG (chlorahexidine gluconate) Soap before surgery.  CHG is an antiseptic cleaner which kills germs and bonds with the skin to continue killing germs even after washing.     Oral Hygiene is also important to reduce your risk of infection.  Remember - BRUSH YOUR TEETH THE MORNING OF SURGERY WITH YOUR REGULAR TOOTHPASTE   Please do not use if you have an allergy to CHG or  antibacterial soaps. If your skin becomes reddened/irritated stop using the CHG.  Do not shave (including legs and underarms) for at least 48 hours prior to first CHG shower. It is OK to shave your face.   Please follow these instructions carefully.                                                                                                                               Shower the NIGHT BEFORE SURGERY and the MORNING OF SURGERY   If you chose to wash your hair, wash your hair first as usual with your normal shampoo.   After you shampoo, rinse your hair and body thoroughly to remove the shampoo.   Use CHG Soap as you would any other liquid soap. You can apply CHG directly to the skin and wash gently with a scrungie or a clean washcloth.    Apply the CHG Soap to your body ONLY FROM THE NECK DOWN.  Do not use on open wounds or open sores. Avoid contact with your eyes, ears, mouth and genitals (private parts). Wash Face and genitals (private parts)   with your normal soap.    Wash thoroughly, paying special attention to the area where your surgery will be performed.   Thoroughly rinse your body with warm water from the neck down.   DO NOT shower/wash with your normal soap after using and rinsing off the CHG Soap.   Pat yourself dry with a CLEAN TOWEL.   Wear CLEAN PAJAMAS to bed the night before surgery   Place CLEAN SHEETS on your bed the night before your surgery   DO NOT SLEEP WITH PETS.     Day of Surgery: Shower with CHG soap. Do not wear jewelry, make up, nail polish, gel polish, artificial nails, or any other type of covering on natural nails including finger and toenails. If patients have artificial nails, gel coating, etc. that need to be removed by a nail salon please have this removed prior to surgery. Surgery may need to be canceled/delayed if the surgeon/ anesthesia feels like the patient is unable to be adequately monitored. Do not wear lotions, powders, perfumes, or deodorant. Do not shave 48 hours prior to surgery.  Do not bring valuables to the hospital. [facility] is not responsible for any belongings or valuables. Wear Clean/Comfortable clothing the morning of surgery Remember to brush your teeth WITH YOUR REGULAR TOOTHPASTE.   Please read over the following fact sheets that you were given.     3 days prior to your procedure or After your COVID test    You are not required to quarantine however you are required to wear a well-fitting mask when you are out and around people not in your household. If your mask becomes wet or soiled, replace with a new one.    Wash your hands often with soap and water for 20 seconds or clean your hands with an alcohol-based hand sanitizer  that contains at least 60% alcohol.    Do not share personal items.    Notify your provider:   o if you are in close contact with someone who has COVID   o or if you develop a fever of 100.4 or greater, sneezing, cough, sore  throat, shortness of breath or body aches.

## 2021-01-07 ENCOUNTER — Encounter (HOSPITAL_COMMUNITY): Payer: Self-pay

## 2021-01-07 ENCOUNTER — Other Ambulatory Visit: Payer: Self-pay

## 2021-01-07 ENCOUNTER — Ambulatory Visit: Payer: Medicare Other | Admitting: Gastroenterology

## 2021-01-07 ENCOUNTER — Encounter (HOSPITAL_COMMUNITY)
Admission: RE | Admit: 2021-01-07 | Discharge: 2021-01-07 | Disposition: A | Discharge status: Home / Self Care | Payer: Medicare Other | Source: Ambulatory Visit | Attending: General Surgery | Admitting: General Surgery

## 2021-01-07 VITALS — BP 122/76 | HR 79 | Temp 98.1°F | Resp 19 | Ht 64.0 in | Wt 139.0 lb

## 2021-01-07 DIAGNOSIS — Z01812 Encounter for preprocedural laboratory examination: Secondary | ICD-10-CM | POA: Insufficient documentation

## 2021-01-07 DIAGNOSIS — Z01818 Encounter for other preprocedural examination: Secondary | ICD-10-CM

## 2021-01-07 LAB — CBC
HCT: 41.3 % (ref 36.0–46.0)
Hemoglobin: 13.2 g/dL (ref 12.0–15.0)
MCH: 30.3 pg (ref 26.0–34.0)
MCHC: 32 g/dL (ref 30.0–36.0)
MCV: 94.7 fL (ref 80.0–100.0)
Platelets: 403 10*3/uL — ABNORMAL HIGH (ref 150–400)
RBC: 4.36 MIL/uL (ref 3.87–5.11)
RDW: 12.8 % (ref 11.5–15.5)
WBC: 7.2 10*3/uL (ref 4.0–10.5)
nRBC: 0 % (ref 0.0–0.2)

## 2021-01-07 NOTE — Progress Notes (Signed)
PCP - Dr. Oswald Hillock Cardiologist - denies   Chest x-ray - 10/26/20-1 view EKG - 10/27/20 Stress Test - denies ECHO - denies Cardiac Cath - denies   Sleep Study - denies CPAP - denies   Blood Thinner Instructions: n/a Aspirin Instructions: n/a   ERAS Protcol -Clear liquids until 1100 DOS PRE-SURGERY Ensure or G2- n/a   COVID TEST- not indicated. Ambulatory surgery.   Anesthesia review: Hx of COPD and asthma   Patient denies shortness of breath, fever, cough and chest pain at PAT appointment     All instructions explained to the patient, with a verbal understanding of the material. Patient agrees to go over the instructions while at home for a better understanding. Patient also instructed to self quarantine after being tested for COVID-19. The opportunity to ask questions was provided.

## 2021-01-12 ENCOUNTER — Encounter: Payer: Self-pay | Admitting: Internal Medicine

## 2021-01-12 ENCOUNTER — Other Ambulatory Visit: Payer: Self-pay

## 2021-01-12 ENCOUNTER — Ambulatory Visit (INDEPENDENT_AMBULATORY_CARE_PROVIDER_SITE_OTHER): Payer: Medicare Other | Admitting: Internal Medicine

## 2021-01-12 DIAGNOSIS — J449 Chronic obstructive pulmonary disease, unspecified: Secondary | ICD-10-CM | POA: Diagnosis not present

## 2021-01-12 DIAGNOSIS — F17210 Nicotine dependence, cigarettes, uncomplicated: Secondary | ICD-10-CM

## 2021-01-12 NOTE — Progress Notes (Signed)
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 Referred to [facility] voice center Thyroid nodule x 4 cm > thyroid US > nov 7th thyroidectomy at [facility]     History of Present Illness  07/29/2020  Pulmonary/ 1st office eval/ Melvyn Novas / [facility]  Chief Complaint  Patient presents with   Consult    Productive cough with white phlegm, sore throat "something growing in it" since March 2022  Dyspnea:  [employer] shopping but has to go to slow stops each aisle = MMRC3 = can't walk 100 yards even at a slow pace at a flat grade s stopping due to sob   Cough: sense of throat congestion/ globus  on advair/ spiriva  Sleep: < 30 degrees /bed is flat with pillows  SABA use: very confused re how/ when to use it 02  None  rec The key is to stop smoking completely before smoking completely stops you! Plan A = Automatic = Always=    spiriva 2 puffs each am and stop powdered advair and spiriva  Prednisone 10 mg take  4 each am x 2 days,   2 each am x 2 days,  1 each am x 2 days and stop  Plan B = Backup (to supplement plan A, not to replace it) Only use your albuterol inhaler as a rescue medicatio Plan C = Crisis (instead of Plan B but only if Plan B stops working) - only use your albuterol nebulizer if you first try Plan B and it fails to help > ok to use the nebulizer up to every 4 hours but if start needing it regularly call for immediate appointment GERD  diet  Please schedule a follow up office visit in 2 weeks, call sooner if needed with  all medications /inhalers/ solutions in hand so we can verify exactly what you are taking. This includes all medications from all doctors and over the counters     08/11/2020  f/u ov/[place] office/Jerie Basford re: probable copd/CB Chief Complaint  Patient presents with   Follow-up  Dyspnea:  Walks mb 50 ft Cough: worse at hs / mucus white  Sleeping: bed is flat with sev pillows  SABA use: much loss  02: none  Covid status: never vax / never got virus   Rec I very strongly recommend you get the moderna or pfizer vaccine   The key is to stop smoking completely before smoking completely stops you! Pantoprazole (protonix) 40 mg   Take  30-60 min before first meal of the day and Pepcid (famotidine)  20 mg after supper  And stop oil based vitamin GERD diet reviewed, bed blocks rec  Continue spiriva 2.5  2 pffs first thing in am For cough > mucinex dm 1200 mg every every hours as needed  Prednisone 10 mg 2 daily until breathing then 1 daily take with breakfast until prednisone  Please remember to go to the lab department @ [facility] for your tests - we will call you with the results when they are  available. Please schedule a follow up office visit in 6-8  weeks with PFTs on return    11/12/2020  f/u ov/[place] office/Rip Hawes re: prob copd/ cb maint on spiriva 2.5 2 each am but not clear taking it / easily confused with meds  / needs GB surgery Chief Complaint  Patient presents with   Follow-up    Patient says her cough is about the same since she last saw Dr.Dave Mannes. She is coughing up clear mucus She reports some increased shortness of breath. No oxygen at home.    Dyspnea:  across the room  Cough: congested mucoid worse in am  Sleeping: sleeping flat/ lots of cough  SABA use: twice daily  02: no 02  Covid status: never infected/ never vaccinated  Rec Stop spiriva Plan A = Automatic = Always=  Breztri Take 2 puffs first thing in am and then another 2 puffs about 12 hours later.    Work on inhaler technique:  Plan B = Backup (to supplement plan A, not to replace it) Only use your albuterol inhaler as a rescue medication ar)  Plan C = Crisis (instead of Plan B but only if Plan B stops working) - only use your albuterol nebulizer if you first try Plan B Pantoprazole (protonix) 40 mg   Take  30-60 min before first meal of the day and Pepcid (famotidine)  20 mg after supper until return to office  For cough > mucinex dm 1200 mg every 12 hours as needed  8 inch blocks under head of bed  Depomedrol 120 mg IM today  Please schedule a follow up office visit in 6 weeks, call sooner if needed - always bring all active medications /inhalers / solutions  - call me with discrepancy   12/16/20  [facility] thyroidectomy    01/12/2021  f/u ov/[place] office/Larine Fielding re: copd GOLD 2  maint on breztri  / still smoking / did not bring meds / no longer prednisone / facing gb surgery / Marlou Starks  Chief Complaint  Patient presents with   Follow-up    Feels SOB and cough have improved since last OV    Dyspnea:  now able to do [employer] shopping but  still doe x steps = MMRC1 = can walk nl pace, flat grade, can't hurry or go uphills or steps s sob   Cough: some at hs / does not keep her up > min mucoid  Sleeping: flat bed / 2 pillows / am congestion better  SABA use: not needing  02: none  Covid status: never vax or infected  Lung cancer screening: ordered by [facility]    No obvious day to day or daytime variability or assoc excess/ purulent sputum or mucus plugs or hemoptysis or cp or chest tightness, subjective wheeze or overt sinus or hb symptoms.    Also denies any obvious fluctuation of symptoms with weather or environmental changes or other aggravating or alleviating factors except as outlined above   No unusual exposure hx or h/o childhood pna/ asthma or knowledge of premature birth.  Current Allergies, Complete Past Medical History, Past Surgical History, Family History, and Social History were  reviewed in [employer] record.  ROS  The following are not active complaints unless bolded Hoarseness, sore throat, dysphagia, dental problems, itching, sneezing,  nasal congestion or discharge of excess mucus or purulent secretions, ear ache,   fever, chills, sweats, unintended wt loss or wt gain, classically pleuritic or exertional cp,  orthopnea pnd or arm/hand swelling  or leg swelling, presyncope, palpitations, abdominal pain/gallstones  anorexia, nausea, vomiting, diarrhea  or change in bowel habits or change in bladder habits, change in stools or change in urine, dysuria, hematuria,  rash, arthralgias, visual complaints, headache, numbness, weakness or ataxia or problems with walking or coordination,  change in mood or  memory.        Current Meds  Medication Sig   acetaminophen (TYLENOL) 325 MG tablet Take 650 mg by mouth every 6 (six) hours as needed for moderate pain.   albuterol (PROVENTIL HFA;VENTOLIN HFA) 108 (90 BASE) MCG/ACT inhaler Inhale 2 puffs into the lungs every 6 (six) hours as needed for shortness of breath.   albuterol (PROVENTIL) (2.5 MG/3ML) 0.083% nebulizer solution Take 3 mLs (2.5 mg total) by nebulization every 4 (four) hours as needed for wheezing or shortness of breath.   Budeson-Glycopyrrol-Formoterol (BREZTRI AEROSPHERE) 160-9-4.8 MCG/ACT AERO Inhale 2 puffs into the lungs 2 (two) times daily.   Calcium Carbonate-Vitamin D3 600-400 MG-UNIT TABS Take 2 tablets by mouth in the morning and at bedtime.   docusate sodium (COLACE) 100 MG capsule Take 1 capsule (100 mg total) by mouth 2 (two) times daily.   famotidine (PEPCID) 20 MG tablet One after supper   fluticasone (FLONASE) 50 MCG/ACT nasal spray Place 1 spray into both nostrils daily as needed for allergies or rhinitis.   levothyroxine (SYNTHROID) 100 MCG tablet Take 1 tablet (100 mcg total) by mouth daily at 6 (six) AM.   montelukast (SINGULAIR) 10 MG tablet Take 10 mg by mouth at bedtime.    ondansetron (ZOFRAN ODT) 4 MG disintegrating tablet 4mg  ODT q4 hours prn nausea/vomit   pantoprazole (PROTONIX) 40 MG tablet Take 1 tablet (40 mg total) by mouth daily. Take 30-60 min before first meal of the day   predniSONE (DELTASONE) 10 MG tablet Take 10 mg by mouth daily.   vitamin B-12 (CYANOCOBALAMIN) 500 MCG tablet Take 500 mcg by mouth daily.   vitamin C (ASCORBIC ACID) 500 MG tablet Take 500 mg by mouth daily.             Past Medical History:  Diagnosis Date   Asthma    Cancer ([place])    Chronic abdominal pain    Chronic back pain    Chronic neck pain    Chronic pain    Colon cancer (HCC)    COPD (chronic obstructive pulmonary disease) (HCC)    Hemorrhoids    IBS (irritable bowel syndrome)    Migraines        Objective:    01/12/2021        139   11/12/2020        140   07/29/20 140 lb 12.8 oz (63.9 kg)  03/31/15 165 lb 9.6 oz (75.1 kg)  08/11/14 170 lb (77.1 kg)     Vital signs reviewed  01/12/2021  - Note at rest 02 sats  97% on RA   General appearance:    amb wf slt hoarse slt congested cough    HEENT : pt wearing mask not removed for exam due to covid - 19 concerns.    NECK :  without JVD/Nodes/TM/ nl carotid upstrokes bilaterally   LUNGS: no acc muscle use,  Mild barrel  contour chest wall with bilateral  Distant bs s audible wheeze and  without cough on insp or exp maneuvers  and mild  Hyperresonant  to  percussion bilaterally     CV:  RRR  no s3 or murmur or  increase in P2, and no edema   ABD:  soft and nontender with pos end  insp Hoover's  in the supine position. No bruits or organomegaly appreciated, bowel sounds nl  MS:   Nl gait/  ext warm without deformities, calf tenderness, cyanosis or clubbing No obvious joint restrictions   SKIN: warm and dry without lesions    NEURO:  alert, approp, nl sensorium with  no motor or cerebellar deficits apparent.           Assessment

## 2021-01-12 NOTE — Assessment & Plan Note (Addendum)
Active smoker/ MZ - 07/29/2020  After extensive coaching inhaler device,  effectiveness =   spiriva smi 2.5 mg q am and prn saba - 08/11/2020 started daily pred 20 mg until better then 10 mg daily > caused thrush so stopped - PFT's 11/04/20   FEV1 1.31  (52 % ) ratio 0.53  p 20 % improvement from saba p ? prior to study with FV curve poor insp flow but no true plateau, classic exp concavity.  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 2) If the pt is convinced, as many are, that saba helps recover from activity faster then it's easy to tell if this is the case by re-challenging : ie stop, take the inhaler, then p 5 minutes try the exact same activity (intensity of workload) that just caused the symptoms and see if they are substantially diminished or not after saba 3) if there is an activity that reproducibly causes the symptoms, try the saba 15 min before the activity on alternate days   If in fact the saba really does help, then fine to continue to use it prn but advised may need to look closer at the maintenance regimen being used to achieve better control of airways disease with exertion.   Alpha one AZ status also reviewed -see avs for instructions unique to this ov  F/u at 6 months

## 2021-01-12 NOTE — Assessment & Plan Note (Addendum)
Counseled re importance of smoking cessation but did not meet time criteria for separate billing     Low-dose CT lung cancer screening is recommended for patients who are 41-64 years of age with a 20+ pack-year history of smoking, and who are currently smoking or quit <=15 years ago.  >>> she is eligible but says [facility] taking care of this           Each maintenance medication was reviewed in detail including emphasizing most importantly the difference between maintenance and prns and under what circumstances the prns are to be triggered using an action plan format where appropriate.  Total time for H and P, chart review, counseling, reviewing hfa device(s) and generating customized AVS unique to this office visit / same day charting = 33 min

## 2021-01-12 NOTE — Patient Instructions (Signed)
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

## 2021-01-13 ENCOUNTER — Other Ambulatory Visit (HOSPITAL_COMMUNITY)
Admission: RE | Admit: 2021-01-13 | Discharge: 2021-01-13 | Disposition: A | Discharge status: Home / Self Care | Payer: Medicare Other | Source: Ambulatory Visit | Attending: Otolaryngology | Admitting: Otolaryngology

## 2021-01-13 DIAGNOSIS — E890 Postprocedural hypothyroidism: Secondary | ICD-10-CM | POA: Diagnosis not present

## 2021-01-13 DIAGNOSIS — R609 Edema, unspecified: Secondary | ICD-10-CM | POA: Insufficient documentation

## 2021-01-13 DIAGNOSIS — C73 Malignant neoplasm of thyroid gland: Secondary | ICD-10-CM | POA: Insufficient documentation

## 2021-01-13 LAB — TSH: TSH: 0.043 u[IU]/mL — ABNORMAL LOW (ref 0.350–4.500)

## 2021-01-14 LAB — PTH, INTACT AND CALCIUM
Calcium, Total (PTH): 9.4 mg/dL (ref 8.7–10.3)
PTH: 16 pg/mL (ref 15–65)

## 2021-01-18 ENCOUNTER — Ambulatory Visit (HOSPITAL_COMMUNITY): Payer: Medicare Other | Admitting: Certified Registered"

## 2021-01-18 ENCOUNTER — Other Ambulatory Visit: Payer: Self-pay

## 2021-01-18 ENCOUNTER — Ambulatory Visit (HOSPITAL_COMMUNITY): Payer: Medicare Other

## 2021-01-18 ENCOUNTER — Encounter (HOSPITAL_COMMUNITY)
Admission: RE | Disposition: A | Discharge status: Home / Self Care | Payer: Self-pay | Source: Ambulatory Visit | Attending: General Surgery

## 2021-01-18 ENCOUNTER — Ambulatory Visit (HOSPITAL_COMMUNITY)
Admission: RE | Admit: 2021-01-18 | Discharge: 2021-01-18 | Disposition: A | Discharge status: Home / Self Care | Payer: Medicare Other | Source: Ambulatory Visit | Attending: General Surgery | Admitting: General Surgery

## 2021-01-18 ENCOUNTER — Encounter (HOSPITAL_COMMUNITY): Payer: Self-pay | Admitting: General Surgery

## 2021-01-18 DIAGNOSIS — M1990 Unspecified osteoarthritis, unspecified site: Secondary | ICD-10-CM | POA: Diagnosis not present

## 2021-01-18 DIAGNOSIS — K8080 Other cholelithiasis without obstruction: Secondary | ICD-10-CM | POA: Diagnosis present

## 2021-01-18 DIAGNOSIS — K8010 Calculus of gallbladder with chronic cholecystitis without obstruction: Secondary | ICD-10-CM | POA: Insufficient documentation

## 2021-01-18 DIAGNOSIS — F17200 Nicotine dependence, unspecified, uncomplicated: Secondary | ICD-10-CM | POA: Diagnosis not present

## 2021-01-18 DIAGNOSIS — K660 Peritoneal adhesions (postprocedural) (postinfection): Secondary | ICD-10-CM | POA: Insufficient documentation

## 2021-01-18 DIAGNOSIS — Z419 Encounter for procedure for purposes other than remedying health state, unspecified: Secondary | ICD-10-CM

## 2021-01-18 DIAGNOSIS — K219 Gastro-esophageal reflux disease without esophagitis: Secondary | ICD-10-CM | POA: Diagnosis not present

## 2021-01-18 DIAGNOSIS — J449 Chronic obstructive pulmonary disease, unspecified: Secondary | ICD-10-CM | POA: Diagnosis not present

## 2021-01-18 DIAGNOSIS — K439 Ventral hernia without obstruction or gangrene: Secondary | ICD-10-CM | POA: Diagnosis not present

## 2021-01-18 HISTORY — PX: CHOLECYSTECTOMY: SHX55

## 2021-01-18 SURGERY — LAPAROSCOPIC CHOLECYSTECTOMY
Anesthesia: General | Site: Abdomen

## 2021-01-18 MED ORDER — CEFAZOLIN SODIUM-DEXTROSE 2-4 GM/100ML-% IV SOLN
INTRAVENOUS | Status: AC
  Filled 2021-01-18: qty 100

## 2021-01-18 MED ORDER — DEXAMETHASONE SODIUM PHOSPHATE 10 MG/ML IJ SOLN
INTRAMUSCULAR | Status: AC
  Filled 2021-01-18: qty 1

## 2021-01-18 MED ORDER — ROCURONIUM BROMIDE 10 MG/ML (PF) SYRINGE
PREFILLED_SYRINGE | INTRAVENOUS | Status: AC
  Filled 2021-01-18: qty 10

## 2021-01-18 MED ORDER — LACTATED RINGERS IV SOLN: INTRAVENOUS | Status: DC

## 2021-01-18 MED ORDER — EPHEDRINE 5 MG/ML INJ
INTRAVENOUS | Status: AC
  Filled 2021-01-18: qty 5

## 2021-01-18 MED ORDER — CHLORHEXIDINE GLUCONATE CLOTH 2 % EX PADS: 6.0000 | MEDICATED_PAD | Freq: Once | CUTANEOUS | Status: DC

## 2021-01-18 MED ORDER — PROPOFOL 10 MG/ML IV BOLUS
INTRAVENOUS | Status: AC
  Filled 2021-01-18: qty 20

## 2021-01-18 MED ORDER — DEXAMETHASONE SODIUM PHOSPHATE 10 MG/ML IJ SOLN
INTRAMUSCULAR | Status: DC | PRN
  Administered 2021-01-18: 8 mg via INTRAVENOUS

## 2021-01-18 MED ORDER — SUGAMMADEX SODIUM 200 MG/2ML IV SOLN
INTRAVENOUS | Status: DC | PRN
  Administered 2021-01-18: 150 mg via INTRAVENOUS

## 2021-01-18 MED ORDER — CEFAZOLIN SODIUM-DEXTROSE 2-4 GM/100ML-% IV SOLN
2.0000 g | INTRAVENOUS | Status: AC
  Administered 2021-01-18: 2 g via INTRAVENOUS

## 2021-01-18 MED ORDER — HYDROCODONE-ACETAMINOPHEN 5-325 MG PO TABS
ORAL_TABLET | ORAL | Status: AC
  Filled 2021-01-18: qty 1

## 2021-01-18 MED ORDER — EPHEDRINE SULFATE-NACL 50-0.9 MG/10ML-% IV SOSY
PREFILLED_SYRINGE | INTRAVENOUS | Status: DC | PRN
  Administered 2021-01-18 (×3): 5 mg via INTRAVENOUS

## 2021-01-18 MED ORDER — LIDOCAINE 2% (20 MG/ML) 5 ML SYRINGE
INTRAMUSCULAR | Status: DC | PRN
  Administered 2021-01-18: 60 mg via INTRAVENOUS

## 2021-01-18 MED ORDER — 0.9 % SODIUM CHLORIDE (POUR BTL) OPTIME
TOPICAL | Status: DC | PRN
  Administered 2021-01-18: 1000 mL

## 2021-01-18 MED ORDER — FENTANYL CITRATE (PF) 100 MCG/2ML IJ SOLN
INTRAMUSCULAR | Status: DC | PRN
  Administered 2021-01-18: 50 ug via INTRAVENOUS
  Administered 2021-01-18: 100 ug via INTRAVENOUS
  Administered 2021-01-18 (×2): 50 ug via INTRAVENOUS

## 2021-01-18 MED ORDER — PHENYLEPHRINE 40 MCG/ML (10ML) SYRINGE FOR IV PUSH (FOR BLOOD PRESSURE SUPPORT)
PREFILLED_SYRINGE | INTRAVENOUS | Status: AC
  Filled 2021-01-18: qty 10

## 2021-01-18 MED ORDER — SODIUM CHLORIDE 0.9 % IR SOLN
Status: DC | PRN
  Administered 2021-01-18: 1000 mL

## 2021-01-18 MED ORDER — ACETAMINOPHEN 500 MG PO TABS
ORAL_TABLET | ORAL | Status: AC
  Administered 2021-01-18: 1000 mg via ORAL
  Filled 2021-01-18: qty 2

## 2021-01-18 MED ORDER — FENTANYL CITRATE (PF) 100 MCG/2ML IJ SOLN
INTRAMUSCULAR | Status: AC
  Filled 2021-01-18: qty 2

## 2021-01-18 MED ORDER — SODIUM CHLORIDE (PF) 0.9 % IJ SOLN
INTRAVENOUS | Status: DC | PRN
  Administered 2021-01-18: 15 mL

## 2021-01-18 MED ORDER — CHLORHEXIDINE GLUCONATE 0.12 % MT SOLN
OROMUCOSAL | Status: AC
  Administered 2021-01-18: 15 mL via OROMUCOSAL
  Filled 2021-01-18: qty 15

## 2021-01-18 MED ORDER — ONDANSETRON HCL 4 MG/2ML IJ SOLN: 4.0000 mg | Freq: Once | INTRAMUSCULAR | Status: DC | PRN

## 2021-01-18 MED ORDER — FENTANYL CITRATE (PF) 100 MCG/2ML IJ SOLN
25.0000 ug | INTRAMUSCULAR | Status: DC | PRN
  Administered 2021-01-18: 25 ug via INTRAVENOUS

## 2021-01-18 MED ORDER — ACETAMINOPHEN 500 MG PO TABS: 1000.0000 mg | ORAL_TABLET | ORAL | Status: AC

## 2021-01-18 MED ORDER — PROPOFOL 10 MG/ML IV BOLUS
INTRAVENOUS | Status: DC | PRN
  Administered 2021-01-18: 120 mg via INTRAVENOUS

## 2021-01-18 MED ORDER — GABAPENTIN 300 MG PO CAPS: 300.0000 mg | ORAL_CAPSULE | ORAL | Status: AC

## 2021-01-18 MED ORDER — CHLORHEXIDINE GLUCONATE 0.12 % MT SOLN: 15.0000 mL | Freq: Once | OROMUCOSAL | Status: AC

## 2021-01-18 MED ORDER — HYDROCODONE-ACETAMINOPHEN 5-325 MG PO TABS: 1.0000 | ORAL_TABLET | Freq: Four times a day (QID) | ORAL | 0 refills | Status: DC | PRN

## 2021-01-18 MED ORDER — ROCURONIUM BROMIDE 10 MG/ML (PF) SYRINGE
PREFILLED_SYRINGE | INTRAVENOUS | Status: DC | PRN
  Administered 2021-01-18: 50 mg via INTRAVENOUS

## 2021-01-18 MED ORDER — ONDANSETRON HCL 4 MG/2ML IJ SOLN
INTRAMUSCULAR | Status: DC | PRN
  Administered 2021-01-18: 4 mg via INTRAVENOUS

## 2021-01-18 MED ORDER — FENTANYL CITRATE (PF) 250 MCG/5ML IJ SOLN
INTRAMUSCULAR | Status: AC
  Filled 2021-01-18: qty 5

## 2021-01-18 MED ORDER — BUPIVACAINE-EPINEPHRINE 0.25% -1:200000 IJ SOLN
INTRAMUSCULAR | Status: DC | PRN
  Administered 2021-01-18: 21 mL

## 2021-01-18 MED ORDER — PHENYLEPHRINE 40 MCG/ML (10ML) SYRINGE FOR IV PUSH (FOR BLOOD PRESSURE SUPPORT)
PREFILLED_SYRINGE | INTRAVENOUS | Status: DC | PRN
  Administered 2021-01-18: 120 ug via INTRAVENOUS

## 2021-01-18 MED ORDER — MIDAZOLAM HCL 2 MG/2ML IJ SOLN
INTRAMUSCULAR | Status: AC
  Filled 2021-01-18: qty 2

## 2021-01-18 MED ORDER — MIDAZOLAM HCL 2 MG/2ML IJ SOLN
INTRAMUSCULAR | Status: DC | PRN
  Administered 2021-01-18: 2 mg via INTRAVENOUS

## 2021-01-18 MED ORDER — BUPIVACAINE-EPINEPHRINE (PF) 0.25% -1:200000 IJ SOLN
INTRAMUSCULAR | Status: AC
  Filled 2021-01-18: qty 30

## 2021-01-18 MED ORDER — GABAPENTIN 300 MG PO CAPS
ORAL_CAPSULE | ORAL | Status: AC
  Administered 2021-01-18: 300 mg via ORAL
  Filled 2021-01-18: qty 1

## 2021-01-18 MED ORDER — ONDANSETRON HCL 4 MG/2ML IJ SOLN
INTRAMUSCULAR | Status: AC
  Filled 2021-01-18: qty 2

## 2021-01-18 MED ORDER — ORAL CARE MOUTH RINSE: 15.0000 mL | Freq: Once | OROMUCOSAL | Status: AC

## 2021-01-18 MED ORDER — LIDOCAINE 2% (20 MG/ML) 5 ML SYRINGE
INTRAMUSCULAR | Status: AC
  Filled 2021-01-18: qty 5

## 2021-01-18 MED ORDER — HYDROCODONE-ACETAMINOPHEN 5-325 MG PO TABS
1.0000 | ORAL_TABLET | Freq: Once | ORAL | Status: AC
  Administered 2021-01-18: 1 via ORAL

## 2021-01-18 SURGICAL SUPPLY — 40 items
APPLIER CLIP 5 13 M/L LIGAMAX5 (MISCELLANEOUS) ×3
BAG COUNTER SPONGE SURGICOUNT (BAG) ×2 IMPLANT
BAG SURGICOUNT SPONGE COUNTING (BAG) ×1
BLADE CLIPPER SURG (BLADE) IMPLANT
CANISTER SUCT 3000ML PPV (MISCELLANEOUS) ×3 IMPLANT
CATH REDDICK CHOLANGI 4FR 50CM (CATHETERS) ×3 IMPLANT
CHLORAPREP W/TINT 26 (MISCELLANEOUS) ×3 IMPLANT
CLIP APPLIE 5 13 M/L LIGAMAX5 (MISCELLANEOUS) ×1 IMPLANT
COVER MAYO STAND STRL (DRAPES) ×3 IMPLANT
COVER SURGICAL LIGHT HANDLE (MISCELLANEOUS) ×3 IMPLANT
DERMABOND ADVANCED (GAUZE/BANDAGES/DRESSINGS) ×2
DERMABOND ADVANCED .7 DNX12 (GAUZE/BANDAGES/DRESSINGS) ×1 IMPLANT
DEVICE TROCAR PUNCTURE CLOSURE (ENDOMECHANICALS) ×3 IMPLANT
DRAPE C-ARM 42X72 X-RAY (DRAPES) ×3 IMPLANT
ELECT REM PT RETURN 9FT ADLT (ELECTROSURGICAL) ×3
ELECTRODE REM PT RTRN 9FT ADLT (ELECTROSURGICAL) ×1 IMPLANT
ENDOLOOP SUT PDS II  0 18 (SUTURE) ×3
ENDOLOOP SUT PDS II 0 18 (SUTURE) ×1 IMPLANT
GLOVE SURG ENC MOIS LTX SZ7.5 (GLOVE) ×3 IMPLANT
GOWN STRL REUS W/ TWL LRG LVL3 (GOWN DISPOSABLE) ×3 IMPLANT
GOWN STRL REUS W/TWL LRG LVL3 (GOWN DISPOSABLE) ×9
IV CATH AUTO 14GX1.75 SAFE ORG (IV SOLUTION) ×3 IMPLANT
KIT BASIN OR (CUSTOM PROCEDURE TRAY) ×3 IMPLANT
KIT TURNOVER KIT B (KITS) ×3 IMPLANT
NS IRRIG 1000ML POUR BTL (IV SOLUTION) ×3 IMPLANT
PAD ARMBOARD 7.5X6 YLW CONV (MISCELLANEOUS) ×3 IMPLANT
POUCH SPECIMEN RETRIEVAL 10MM (ENDOMECHANICALS) ×3 IMPLANT
SCISSORS LAP 5X35 DISP (ENDOMECHANICALS) ×3 IMPLANT
SET IRRIG TUBING LAPAROSCOPIC (IRRIGATION / IRRIGATOR) ×3 IMPLANT
SET TUBE SMOKE EVAC HIGH FLOW (TUBING) ×3 IMPLANT
SLEEVE ENDOPATH XCEL 5M (ENDOMECHANICALS) ×6 IMPLANT
SPECIMEN JAR SMALL (MISCELLANEOUS) ×3 IMPLANT
SUT MNCRL AB 4-0 PS2 18 (SUTURE) ×3 IMPLANT
TOWEL GREEN STERILE (TOWEL DISPOSABLE) ×3 IMPLANT
TOWEL GREEN STERILE FF (TOWEL DISPOSABLE) ×3 IMPLANT
TRAY LAPAROSCOPIC MC (CUSTOM PROCEDURE TRAY) ×3 IMPLANT
TROCAR XCEL BLUNT TIP 100MML (ENDOMECHANICALS) ×3 IMPLANT
TROCAR XCEL NON-BLD 11X100MML (ENDOMECHANICALS) ×3 IMPLANT
TROCAR XCEL NON-BLD 5MMX100MML (ENDOMECHANICALS) ×3 IMPLANT
WATER STERILE IRR 1000ML POUR (IV SOLUTION) ×3 IMPLANT

## 2021-01-18 NOTE — Interval H&P Note (Signed)
History and Physical Interval Note:  01/18/2021 11:58 AM  Paula Carrillo  has presented today for surgery, with the diagnosis of GALLSTONES.  The various methods of treatment have been discussed with the patient and family. After consideration of risks, benefits and other options for treatment, the patient has consented to  Procedure(s): LAPAROSCOPIC CHOLECYSTECTOMY WITH INTRAOPERATIVE CHOLANGIOGRAM, POSSIBLE OPEN (N/A) as a surgical intervention.  The patient's history has been reviewed, patient examined, no change in status, stable for surgery.  I have reviewed the patient's chart and labs.  Questions were answered to the patient's satisfaction.     Autumn Messing III

## 2021-01-18 NOTE — Anesthesia Preprocedure Evaluation (Signed)
Anesthesia Evaluation  Patient identified by MRN, date of birth, ID band Patient awake    Reviewed: Allergy & Precautions, NPO status , Patient's Chart, lab work & pertinent test results  Airway Mallampati: II  TM Distance: >3 FB Neck ROM: Full    Dental  (+) Dental Advisory Given, Edentulous Lower, Edentulous Upper   Pulmonary asthma , COPD,  COPD inhaler, Current SmokerPatient did not abstain from smoking.,    Pulmonary exam normal breath sounds clear to auscultation       Cardiovascular negative cardio ROS Normal cardiovascular exam Rhythm:Regular Rate:Normal     Neuro/Psych  Headaches, negative psych ROS   GI/Hepatic Neg liver ROS, GERD  ,  Endo/Other  negative endocrine ROS  Renal/GU negative Renal ROS     Musculoskeletal  (+) Arthritis ,   Abdominal   Peds  Hematology negative hematology ROS (+)   Anesthesia Other Findings Day of surgery medications reviewed with the patient.  Reproductive/Obstetrics                             Anesthesia Physical Anesthesia Plan  ASA: 3  Anesthesia Plan: General   Post-op Pain Management:    Induction: Intravenous  PONV Risk Score and Plan: 3 and Midazolam, Dexamethasone and Ondansetron  Airway Management Planned: Oral ETT  Additional Equipment:   Intra-op Plan:   Post-operative Plan: Extubation in OR  Informed Consent: I have reviewed the patients History and Physical, chart, labs and discussed the procedure including the risks, benefits and alternatives for the proposed anesthesia with the patient or authorized representative who has indicated his/her understanding and acceptance.     Dental advisory given  Plan Discussed with: CRNA  Anesthesia Plan Comments:         Anesthesia Quick Evaluation

## 2021-01-18 NOTE — Transfer of Care (Signed)
Immediate Anesthesia Transfer of Care Note  Patient: Paula Carrillo  Procedure(s) Performed: LAPAROSCOPIC CHOLECYSTECTOMY WITH INTRAOPERATIVE CHOLANGIOGRAM (Abdomen)  Patient Location: PACU  Anesthesia Type:General  Level of Consciousness: drowsy and patient cooperative  Airway & Oxygen Therapy: Patient Spontanous Breathing  Post-op Assessment: Report given to RN  Post vital signs: Reviewed and stable  Last Vitals:  Vitals Value Taken Time  BP 136/83 01/18/21 1450  Temp    Pulse 94 01/18/21 1456  Resp 36 01/18/21 1456  SpO2 94 % 01/18/21 1456  Vitals shown include unvalidated device data.  Last Pain:  Vitals:   01/18/21 1217  TempSrc:   PainSc: 8       Patients Stated Pain Goal: 0 (19/01/22 2411)  Complications: No notable events documented.

## 2021-01-18 NOTE — Op Note (Signed)
01/18/2021  2:40 PM  PATIENT:  Paula Carrillo  65 y.o. female  PRE-OPERATIVE DIAGNOSIS:  GALLSTONES  POST-OPERATIVE DIAGNOSIS:  GALLSTONES  PROCEDURE:  Procedure(s): LAPAROSCOPIC CHOLECYSTECTOMY WITH INTRAOPERATIVE CHOLANGIOGRAM (N/A)  SURGEON:  Surgeon(s) and Role:    * Jovita Kussmaul, MD - Primary    * Clovis Riley, MD - Assisting  PHYSICIAN ASSISTANT:   ASSISTANTS: as above   ANESTHESIA:   local and general  EBL:  minimal   BLOOD ADMINISTERED:none  DRAINS: none   LOCAL MEDICATIONS USED:  MARCAINE     SPECIMEN:  Source of Specimen:  gallbladder  DISPOSITION OF SPECIMEN:  PATHOLOGY  COUNTS:  YES  TOURNIQUET:  * No tourniquets in log *  DICTATION: .Dragon Dictation  After informed consent was obtained the patient was brought to the operating room and placed in the supine position on the operating table.  After adequate induction of general anesthesia the patient's abdomen was prepped with ChloraPrep, allowed to dry, and draped in usual sterile manner.  An appropriate timeout was performed.  Given her previous surgery a site was chosen in the right upper quadrant to try to access the abdominal cavity.  This area was infiltrated with quarter percent Marcaine.  A small stab incision was made with a 15 blade knife.  A 5 mm Optiview port and camera were used to bluntly dissected the layers of the abdominal wall under direct vision until access was gained to the abdominal cavity.  The abdomen is then insufflated with carbon dioxide without difficulty.  The camera was placed through the port and the abdomen was inspected.  The right upper quadrant was relatively free.  There were some adhesions to the anterior abdominal wall along the midline.  Another 5 mm port was placed lateral to the first 1 under direct vision.  The adhesions superiorly to the midline were very filmy and were taken down sharply with laparoscopic scissors.  I was able to identify a small epigastric  ventral hernia that was easily reduced.  A small incision was made overlying this hernia site and a 10 mm port was placed through the fascial defect.  Another 5 mm port was placed under direct vision high in the epigastric area.  The camera was then placed through the 10 mm port and I was able to place a blunt grasper through the lateralmost 5 mm port and use it to grasp the dome of the gallbladder and elevated anteriorly and superiorly.  The other blunt grasper was placed through the other 5 mm port and used to retract on the body and neck of the gallbladder.  A dissector was placed through the highest epigastric port and using the cautery the peritoneal reflection overlying the gallbladder neck cystic duct junction was open.  Blunt dissection was carried out in this area until the gallbladder neck cystic duct junction was readily identified and a good critical window was created.  The cystic duct seemed a little dilated and seem to either have a stone in the cystic duct or an inflamed lymph node adjacent to it.  It was difficult to tell the difference between the 2.  A single clip was placed on the gallbladder neck.  A small ductotomy was made just below the clip with the laparoscopic scissors.  A 14-gauge Angiocath was placed percutaneously through the anterior abdominal wall under direct vision.  A Reddick cholangiogram catheter was placed through the Angiocath and flushed.  The Reddick catheter was placed in the cystic duct  and anchored in place with a clip.  A cholangiogram was obtained.  We were able to see common duct distally and the branching proximally but there was too much extravasation of the contrast to get many more details about a possible stone in the cystic duct stump.  Our catheter was clearly in the cystic duct though and the anatomy was clear on this.  At this point in order to avoid any possible injury to the common duct we elected to control the cystic duct with a clip and a Endoloop once the  cystic duct was divided.  Posterior this the cystic artery was also identified and dissected circumferentially until a good window was created.  2 clips were placed proximally and 1 distally on the artery and the artery was divided between the 2.  Next the gallbladder was separated from the liver bed using the hook electrocautery.  Once the gallbladder was completely detached from the liver bed the liver bed was inspected and appeared to be hemostatic.  The camera was then moved to the highest epigastric port.  A laparoscopic bag was inserted through the 10 mm port and the gallbladder was placed within the bag and the bag was sealed.  The gallbladder and bag were then removed through the 10 mm port site without difficulty.  The fascial defect was then closed with a 0 Vicryl using a suture passer.  The abdomen was then inspected again and the liver bed was hemostatic.  The abdomen was irrigated with copious amounts of saline.  The ports were then removed under direct vision without difficulty.  The skin incisions were closed with interrupted 4-0 Monocryl subcuticular stitches.  Dermabond dressings were applied.  The patient tolerated the procedure well.  At the end of the case all needle sponge and instrument counts were correct.  The patient was then awakened and taken recovery in stable condition.  PLAN OF CARE: Discharge to home after PACU  PATIENT DISPOSITION:  PACU - hemodynamically stable.   Delay start of Pharmacological VTE agent (>24hrs) due to surgical blood loss or risk of bleeding: not applicable

## 2021-01-18 NOTE — H&P (Signed)
REFERRING PHYSICIAN: Virl Cagey, MD  PROVIDER: Landry Corporal, MD  MRN: [id number] DOB: [birth year] DATE OF ENCOUNTER: 12/08/2020 Subjective  Chief Complaint: New Consultation (Gallbladder and Thyroid)   History of Present Illness: Paula Carrillo is a 65 y.o. female who is seen today as an office consultation at the request of Dr. Constance Haw for evaluation of New Consultation (Gallbladder and Thyroid) .   We are asked to see the patient in consultation by Dr. Blake Divine to evaluate her for gallstones. The patient is a 65 year old white female who has been experiencing epigastric and right upper quadrant pain for the last couple months. The pain occurs on almost daily basis. Some days are worse than others. Her pain is associated with some nausea and vomiting but she has been able to keep some food down. She underwent CT scan to evaluate this pain and was found to have stones in the gallbladder but no gallbladder wall thickening or ductal dilatation. Her liver functions were normal. She was also noted to have a very small epigastric ventral hernia. She is scheduled for thyroidectomy next week with the ENT doctors  Review of Systems: A complete review of systems was obtained from the patient. I have reviewed this information and discussed as appropriate with the patient. See HPI as well for other ROS.  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)   Asthma   Bradycardia   Calculus of gallbladder without cholecystitis without obstruction   Abdominal pain   Chest pain   Chronic lumbar pain   Chronic obstructive pulmonary disease, unspecified (CMS-HCC)   Cigarette smoker   Colon adenoma   Diarrhea   GERD (gastroesophageal reflux disease)    Hemorrhoids   Musculoskeletal pain   Personal history of malignant neoplasm of large intestine   Rectal bleeding   Thyroid nodule   Upper airway cough syndrome   Past Surgical History:  Procedure Laterality Date   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    Allergies  Allergen Reactions   Sulfa (Sulfonamide Antibiotics) Hives and Itching   Current Outpatient Medications on File Prior to Visit  Medication Sig Dispense Refill   albuterol 90 mcg/actuation inhaler Inhale into the lungs every 6 (six) hours as needed   budesonide-glycopyrrolate-formoterol (BREZTRI AEROSPHERE) 160-9-4.8 mcg/actuation inhaler Inhale 2 inhalations into the lungs 2 (two) times daily   HYDROcodone-acetaminophen (NORCO) 5-325 mg tablet Take 1 tablet by mouth every 6 (six) hours as needed   ondansetron (ZOFRAN-ODT) 4 MG disintegrating tablet DISSOLVE 1 TABLET IN MOUTH EVERY 4 HOURS AS NEEDED FOR NAUSEA AND VOMITING   No current facility-administered medications on file prior to visit.   Family History  Problem Relation Age of Onset   Skin cancer Mother   Skin cancer Brother   High blood pressure (Hypertension) Brother   Hyperlipidemia (Elevated cholesterol) Brother   Colon cancer Brother    Social History   Tobacco Use  Smoking Status Current Every Day Smoker  Smokeless Tobacco Never Used    Social History   Socioeconomic History   Marital status: Legally Separated  Tobacco Use   Smoking status: Current Every Day Smoker   Smokeless tobacco: Never Used  Substance and Sexual Activity   Alcohol use: Never   Drug use: Never   Objective:  Vitals:  BP: 132/72  Temp: 36.6 C (97.9 F)  Weight: 62.1 kg (136 lb 12.8 oz)  Height: 162.6 cm (5\' 4" )   Body mass index is 23.48 kg/m.  Physical Exam Vitals reviewed.  Constitutional:  General: She is not in acute distress. Appearance: Normal appearance.  HENT:  Head: Normocephalic and atraumatic.  Right Ear: External ear normal.  Left Ear: External ear  normal.  Nose: Nose normal.  Mouth/Throat:  Mouth: Mucous membranes are moist.  Pharynx: Oropharynx is clear.  Eyes:  General: No scleral icterus. Extraocular Movements: Extraocular movements intact.  Conjunctiva/sclera: Conjunctivae normal.  Pupils: Pupils are equal, round, and reactive to light.  Neck:  Comments: She does have an enlarged thyroid gland Cardiovascular:  Rate and Rhythm: Normal rate and regular rhythm.  Pulses: Normal pulses.  Heart sounds: Normal heart sounds.  Pulmonary:  Effort: Pulmonary effort is normal. No respiratory distress.  Breath sounds: Normal breath sounds.  Abdominal:  General: Bowel sounds are normal.  Palpations: Abdomen is soft.  Tenderness: There is abdominal tenderness.  Comments: There is a well-healed midline scar. She has moderate diffuse abdominal tenderness that seems worse in the right upper quadrant. There is no palpable evidence of hernia. There is no palpable mass  Musculoskeletal:  General: No swelling, tenderness or deformity. Normal range of motion.  Cervical back: Normal range of motion and neck supple.  Skin: General: Skin is warm and dry.  Coloration: Skin is not jaundiced.  Neurological:  General: No focal deficit present.  Mental Status: She is alert and oriented to person, place, and time.  Psychiatric:  Mood and Affect: Mood normal.  Behavior: Behavior normal.     Labs, Imaging and Diagnostic Testing:  Assessment and Plan:  Diagnoses and all orders for this visit:  Calculus of gallbladder without cholecystitis without obstruction    The patient appears to have symptomatic gallstones. Because of the risk of further painful episodes and possible pancreatitis I feel she would benefit from having her gallbladder removed. She would also like to have this done. I have discussed with her in detail the risks and benefits of the operation as well as some of the technical aspects including the risk of common bile duct  injury and she understands and wishes to proceed. We will certainly try to do this laparoscopically but given her previous surgery it may have to be done open. She is scheduled for thyroidectomy next week. We will plan for surgery after she has recovered. I have encouraged her to try to stop smoking before the time of surgery

## 2021-01-18 NOTE — Anesthesia Procedure Notes (Signed)
Procedure Name: Intubation Date/Time: 01/18/2021 1:32 PM Performed by: Barrington Ellison, CRNA Pre-anesthesia Checklist: Patient identified, Emergency Drugs available, Suction available and Patient being monitored Patient Re-evaluated:Patient Re-evaluated prior to induction Oxygen Delivery Method: Circle System Utilized Preoxygenation: Pre-oxygenation with 100% oxygen Induction Type: IV induction Ventilation: Mask ventilation without difficulty Laryngoscope Size: Mac and 3 Grade View: Grade I Tube type: Oral Tube size: 7.0 mm Number of attempts: 1 Airway Equipment and Method: Stylet and Oral airway Placement Confirmation: ETT inserted through vocal cords under direct vision, positive ETCO2 and breath sounds checked- equal and bilateral Secured at: 21 cm Tube secured with: Tape Dental Injury: Teeth and Oropharynx as per pre-operative assessment

## 2021-01-19 ENCOUNTER — Encounter (HOSPITAL_COMMUNITY): Payer: Self-pay | Admitting: General Surgery

## 2021-01-19 NOTE — Anesthesia Postprocedure Evaluation (Signed)
Anesthesia Post Note  Patient: CAILEN TEXEIRA  Procedure(s) Performed: LAPAROSCOPIC CHOLECYSTECTOMY WITH INTRAOPERATIVE CHOLANGIOGRAM (Abdomen)     Patient location during evaluation: PACU Anesthesia Type: General Level of consciousness: awake and alert Pain management: pain level controlled Vital Signs Assessment: post-procedure vital signs reviewed and stable Respiratory status: spontaneous breathing, nonlabored ventilation, respiratory function stable and patient connected to nasal cannula oxygen Cardiovascular status: blood pressure returned to baseline and stable Postop Assessment: no apparent nausea or vomiting Anesthetic complications: no   No notable events documented.  Last Vitals:  Vitals:   01/18/21 1508 01/18/21 1523  BP: 128/79 130/73  Pulse: 82 72  Resp: 17 15  Temp:  36.4 C  SpO2: 94% 92%    Last Pain:  Vitals:   01/18/21 1523  TempSrc:   PainSc: [place]

## 2021-01-20 LAB — SURGICAL PATHOLOGY

## 2021-02-23 LAB — COLOGUARD
COLOGUARD: NEGATIVE
COLOGUARD: NEGATIVE

## 2021-04-20 ENCOUNTER — Other Ambulatory Visit: Payer: Self-pay

## 2021-04-20 ENCOUNTER — Ambulatory Visit (INDEPENDENT_AMBULATORY_CARE_PROVIDER_SITE_OTHER): Payer: Medicare Other | Admitting: Gastroenterology

## 2021-04-20 ENCOUNTER — Encounter: Payer: Self-pay | Admitting: Gastroenterology

## 2021-04-20 VITALS — BP 125/75 | HR 75 | Temp 97.3°F | Ht 64.0 in | Wt 139.2 lb

## 2021-04-20 DIAGNOSIS — K6289 Other specified diseases of anus and rectum: Secondary | ICD-10-CM

## 2021-04-20 DIAGNOSIS — K219 Gastro-esophageal reflux disease without esophagitis: Secondary | ICD-10-CM

## 2021-04-20 MED ORDER — NITROGLYCERIN 0.4 % RE OINT: 1.0000 "application " | TOPICAL_OINTMENT | Freq: Two times a day (BID) | RECTAL | 0 refills | Status: DC

## 2021-04-20 MED ORDER — PANTOPRAZOLE SODIUM 40 MG PO TBEC: 40.0000 mg | DELAYED_RELEASE_TABLET | Freq: Every day | ORAL | 3 refills | Status: AC

## 2021-04-20 NOTE — Patient Instructions (Signed)
I recommend a colonoscopy when you are able to do so.  For reflux: start taking pantoprazole (Protonix) once each morning, 30 minutes before breakfast. You can continue Pepcid as needed.  I have sent in the rectal cream to use twice a day per rectum. Make sure to use gloves when applying and wash hands thereafter. Let me know if this is not covered  We will see you in 3 months!  I enjoyed seeing you again today! As you know, I value our relationship and want to provide genuine, compassionate, and quality care. I welcome your feedback. If you receive a survey regarding your visit,  I greatly appreciate you taking time to fill this out. See you next time!  Annitta Needs, PhD, ANP-BC [facility]

## 2021-04-20 NOTE — Progress Notes (Signed)
Referring Provider: Leeanne Rio, MD Primary Care Physician:  Leeanne Rio, MD Primary GI: Dr. Abbey Chatters   Chief Complaint  Patient presents with   Abdominal Pain    Better now. Had gallbladder removed and hernia repaired. Bowels are doing ok. Had some diarrhea after gallbladder removed but it's better   rectal burning/pain    Wants some more cream    HPI:   Paula Carrillo is a 66 y.o. female presenting today with a history of  colon cancer many years ago s/p resection, last colonoscopy 2014 with single polyp in ascending colon, two sessile polyps in rectum, large internal hemorrhoids s/p banding X 3. Pathology: tubular adenoma and hyperplastic.   In interim from last seen, had cholecystectomy, hernia repair, and thyroidectomy. She is much improved with abdominal pain, N/V. GERD remains in issue. Taking Pepcid but not ideally managing symptoms. No N/V. No dysphagia. No abdominal pain.   Sometimes burning and itching in rectum. Has rectal discomfort with BM. No rectal bleeding. States she had a salve that came with gloves that helped. Cologuard negative recently. Appears she was prescribed Rectiv in past.   She does not want to pursue colonoscopy right now.    Past Medical History:  Diagnosis Date   Arthritis    Asthma    Cancer ([place])    Chronic abdominal pain    Chronic back pain    Chronic neck pain    Chronic pain    Colon cancer (HCC)    COPD (chronic obstructive pulmonary disease) (HCC)    Hemorrhoids    IBS (irritable bowel syndrome)    Migraines    Thyroid cancer ([place])    Thyroid disease     Past Surgical History:  Procedure Laterality Date   ABDOMINAL HYSTERECTOMY     APPENDECTOMY     BREAST LUMPECTOMY     pt denies this   CHOLECYSTECTOMY N/A 01/18/2021   Procedure: LAPAROSCOPIC CHOLECYSTECTOMY WITH INTRAOPERATIVE CHOLANGIOGRAM;  Surgeon: Jovita Kussmaul, MD;  Location: [facility];  Service: General;  Laterality: N/A;   COLON RESECTION      COLONOSCOPY  07/17/2009   HEN:IDPOE internal hemorrhoids/tortuous colon/3-mm sessile hepatic polyp/sigmoid colon diverticula, small internal hemorrhoids, path: tubular adenoma   COLONOSCOPY N/A 07/23/2012   UMP:NTIRWE polyp in the ascending colon/Two sessile polyps in the rectum/RECTAL BLEEDING DUE TO Large internal hemorrhoids-S/P BANDINGx3   THYROIDECTOMY N/A 12/16/2020   Procedure: TOTAL THYROIDECTOMY;  Surgeon: Jason Coop, DO;  Location: [facility];  Service: ENT;  Laterality: N/A;    Current Outpatient Medications  Medication Sig Dispense Refill   acetaminophen (TYLENOL) 325 MG tablet Take 650 mg by mouth every 6 (six) hours as needed for moderate pain.     acetaminophen (TYLENOL) 650 MG CR tablet Take 650-1,300 mg by mouth every 8 (eight) hours as needed for pain.     albuterol (PROVENTIL HFA;VENTOLIN HFA) 108 (90 BASE) MCG/ACT inhaler Inhale 2 puffs into the lungs every 6 (six) hours as needed for shortness of breath.     albuterol (PROVENTIL) (2.5 MG/3ML) 0.083% nebulizer solution Take 3 mLs (2.5 mg total) by nebulization every 4 (four) hours as needed for wheezing or shortness of breath.     Calcium Carbonate-Vitamin D3 600-400 MG-UNIT TABS Take 2 tablets by mouth in the morning and at bedtime. 120 tablet 2   famotidine (PEPCID) 20 MG tablet One after supper 30 tablet 11   fluticasone (FLONASE) 50 MCG/ACT nasal spray Place 1  spray into both nostrils daily as needed for allergies or rhinitis.     HYDROcodone-acetaminophen (NORCO/VICODIN) 5-325 MG tablet Take 1 tablet by mouth every 6 (six) hours as needed for moderate pain or severe pain. 15 tablet 0   levothyroxine (SYNTHROID) 125 MCG tablet Take 125 mcg by mouth daily.     montelukast (SINGULAIR) 10 MG tablet Take 10 mg by mouth at bedtime.     Nitroglycerin 0.4 % OINT Place 1 application rectally 2 (two) times daily. Per rectum. Wear gloves while applying and wash hands thereafter. 30 g 0   pantoprazole (PROTONIX) 40 MG tablet Take  1 tablet (40 mg total) by mouth daily. 30 minutes before breakfast 90 tablet 3   vitamin B-12 (CYANOCOBALAMIN) 500 MCG tablet Take 500 mcg by mouth daily.     vitamin C (ASCORBIC ACID) 500 MG tablet Take 500 mg by mouth daily.     levothyroxine (SYNTHROID) 100 MCG tablet Take 1 tablet (100 mcg total) by mouth daily at 6 (six) AM. (Patient not taking: Reported on 04/20/2021) 30 tablet 2   No current facility-administered medications for this visit.    Allergies as of 04/20/2021 - Review Complete 04/20/2021  Allergen Reaction Noted   Sulfa antibiotics Hives and Itching 07/11/2012    Family History  Problem Relation Age of Onset   Colon cancer Neg Hx    Hypertension Mother    CVA Mother    Parkinson's disease Mother    Emphysema Father     Social History   Socioeconomic History   Marital status: Legally Separated    Spouse name: Not on file   Number of children: Not on file   Years of education: Not on file   Highest education level: Not on file  Occupational History   Not on file  Tobacco Use   Smoking status: Every Day    Packs/day: 1.00    Types: Cigarettes   Smokeless tobacco: Former    Quit date: 05/09/1974   Tobacco comments:    smokes 1 pack per day 07/29/20  Vaping Use   Vaping Use: Never used  Substance and Sexual Activity   Alcohol use: No    Alcohol/week: 0.0 standard drinks   Drug use: No   Sexual activity: Not on file  Other Topics Concern   Not on file  Social History Narrative   Not on file   Social Determinants of Health   Financial Resource Strain: Not on file  Food Insecurity: Not on file  Transportation Needs: Not on file  Physical Activity: Not on file  Stress: Not on file  Social Connections: Not on file    Review of Systems: Gen: Denies fever, chills, anorexia. Denies fatigue, weakness, weight loss.  CV: Denies chest pain, palpitations, syncope, peripheral edema, and claudication. Resp: Denies dyspnea at rest, cough, wheezing, coughing  up blood, and pleurisy. GI: see HPI Derm: Denies rash, itching, dry skin Psych: Denies depression, anxiety, memory loss, confusion. No homicidal or suicidal ideation.  Heme: Denies bruising, bleeding, and enlarged lymph nodes.  Physical Exam: BP 125/75    Pulse 75    Temp (!) 97.3 F (36.3 C) (Temporal)    Ht 5\' 4"  (1.626 m)    Wt 139 lb 3.2 oz (63.1 kg)    BMI 23.89 kg/m  General:   Alert and oriented. No distress noted. Pleasant and cooperative.  Head:  Normocephalic and atraumatic. Eyes:  Conjuctiva clear without scleral icterus. Mouth:  mask in place Lungs: expiratory wheeze  bilaterally Cardiac: S1 S2 present without murmurs Abdomen:  +BS, soft, non-tender and non-distended. No rebound or guarding. No HSM or masses noted. Rectal: small external hemorrhoid tag. Mild discomfort with DRE. No mass. No obvious fissure.  Msk:  Symmetrical without gross deformities. Normal posture. Extremities:  Without edema. Neurologic:  Alert and  oriented x4 Psych:  Alert and cooperative. Normal mood and affect.  ASSESSMENT: Paula Carrillo is a 66 y.o. female presenting today with a history of  colon cancer many years ago s/p resection, last colonoscopy 2014 with single polyp in ascending colon, two sessile polyps in rectum, large internal hemorrhoids s/p banding X 3. Pathology: tubular adenoma and hyperplastic. Chronic GERD.  History of colon cancer: overdue for surveillance. She is declining colonoscopy at this point. Cologuard recently negative. I have advised colonoscopy when willing. She is aware she is high risk due to history of colon cancer.  GERD: not ideally managed with Pepcid. Will resume Protonix daily.  Rectal pain: history of fissure. No rectal bleeding. Just mild discomfort on DRE. Difficult historian. Could have small fissure. She did well with Rectiv in past. Will send this in again. May not be affordable; if so, will see about [facility] cream.   PLAN:  Colonoscopy  when willing Protonix daily sent to pharmacy Rectiv BID: sent to pharmacy  Return in 3 months  Annitta Needs, PhD, [facility]

## 2021-04-21 ENCOUNTER — Telehealth: Payer: Self-pay

## 2021-04-21 NOTE — Telephone Encounter (Signed)
FYIVicente Males  Phone call from [facility] regarding pt's Rx for Rectiv 0.4% ointment was denied because it was put in 30g X 10 days but if the pharmacy changes it to 30 days it does not need a PA. If the pt has to have more before 30 days she will need a PA. Spoke with Vince concerning this.   Phoned to [facility] in [place] @ [phone] spoke with Raquel Sarna and advised her of the same and she changed it in the system and the Rx went through for the pt and they will text her when Rx is ready.   Phoned the pt and advised of the above and that to use it as directed because if more is required a PA will be needed. Pt expressed understanding.

## 2021-04-21 NOTE — Telephone Encounter (Signed)
PA done on Cover My Meds for Rectiv 0.4% Ointment. Pt has tried/failed: Anusol-HC and Proctcort. Waiting for Cover My Meds.

## 2021-07-19 ENCOUNTER — Encounter: Payer: Self-pay | Admitting: Internal Medicine

## 2021-07-19 ENCOUNTER — Ambulatory Visit (INDEPENDENT_AMBULATORY_CARE_PROVIDER_SITE_OTHER): Payer: Medicare Other | Admitting: Internal Medicine

## 2021-07-19 DIAGNOSIS — F17210 Nicotine dependence, cigarettes, uncomplicated: Secondary | ICD-10-CM | POA: Diagnosis not present

## 2021-07-19 DIAGNOSIS — J449 Chronic obstructive pulmonary disease, unspecified: Secondary | ICD-10-CM | POA: Diagnosis not present

## 2021-07-19 MED ORDER — ALBUTEROL SULFATE (2.5 MG/3ML) 0.083% IN NEBU: 2.5000 mg | INHALATION_SOLUTION | RESPIRATORY_TRACT | 1 refills | Status: AC | PRN

## 2021-07-19 MED ORDER — PREDNISONE 10 MG PO TABS: ORAL_TABLET | ORAL | 0 refills | Status: DC

## 2021-07-19 NOTE — Progress Notes (Signed)
? ?Paula Carrillo, female    DOB: 10/21/1955     MRN: [id number] ? ? ?Brief patient profile:  ?76 yowf MZ/active smoker says dx with copd around 2012 and maint on advair and spiriva and referred to pulmonary clinic in [facility]  07/29/2020 by Dr   Huel Cote with worsening sob/cough with globus on advair/spiriva dpi  ? ? ?07/13/20 ent eval: ?moderate interarytenoid edema and erythema consistent with laryngopharyngeal reflux. 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 Referred to [facility] ?Thyroid nodule x 4 cm > thyroid US > nov 7th thyroidectomy at [facility]  ? ? ? ?History of Present Illness  ?07/29/2020  Pulmonary/ 1st office eval/ Melvyn Novas / [facility]  ?Chief Complaint  ?Patient presents with  ? Consult  ?  Productive cough with white phlegm, sore throat "something growing in it" since March 2022  ?Dyspnea:  [employer] shopping but has to go to slow stops each aisle ?= MMRC3 = can't walk 100 yards even at a slow pace at a flat grade s stopping due to sob   ?Cough: sense of throat congestion/ globus  on advair/ spiriva  ?Sleep: < 30 degrees /bed is flat with pillows  ?SABA use: very confused re how/ when to use it ?02  None  ?rec ?The key is to stop smoking completely before smoking completely stops you! ?Plan A = Automatic = Always=    spiriva 2 puffs each am and stop powdered advair and spiriva  ?Prednisone 10 mg take  4 each am x 2 days,   2 each am x 2 days,  1 each am x 2 days and stop  ?Plan B = Backup (to supplement plan A, not to replace it) ?Only use your albuterol inhaler as a rescue medicatio ?Plan C = Crisis (instead of Plan B but only if Plan B stops working) ?- only use your albuterol nebulizer if you first try Plan B and it fails to help > ok to use the nebulizer up to every 4 hours but if start needing it regularly call for immediate appointment ?GERD  diet ? Please schedule a follow up office visit in 2 weeks, call sooner if needed with  all medications /inhalers/ solutions in hand so we can verify exactly what you are taking. This includes all medications from all doctors and over the counters   ? ? ? ?11/12/2020  f/u ov/[place] office/Teegan Guinther re: prob copd/ cb maint on spiriva 2.5 2 each am but not clear taking it / easily confused with meds  / needs GB surgery ?Chief Complaint  ?Patient presents with  ? Follow-up  ?  Patient says her cough is about the same since she last saw Dr.Earlee Herald. She is coughing up clear mucus She reports some increased shortness of breath. No oxygen at home.   ? Dyspnea:  across the room  ?Cough: congested mucoid worse in am  ?Sleeping: sleeping flat/ lots of cough  ?SABA use: twice daily  ?02: no 02  ?Covid status: never infected/ never vaccinated  ?Rec ?Stop spiriva ?Plan A = Automatic = Always=  Breztri Take 2 puffs first thing in am and then another 2 puffs about 12 hours later.  ? Work on inhaler technique:  ?Plan B = Backup (to supplement plan A, not to replace it) ?Only use your albuterol inhaler as a rescue medication ar)  ?Plan C = Crisis (instead of Plan B but only if Plan B stops working) ?-  only use your albuterol nebulizer if you first try Plan B ?Pantoprazole (protonix) 40 mg   Take  30-60 min before first meal of the day and Pepcid (famotidine)  20 mg after supper until return to office  ?For cough > mucinex dm 1200 mg every 12 hours as needed  ?8 inch blocks under head of bed  ?Depomedrol 120 mg IM today  ?Please schedule a follow up office visit in 6 weeks, call sooner if needed - always bring all active medications /inhalers / solutions  - call me with discrepancy  ? ?12/16/20  [facility] thyroidectomy  ? ? ?01/12/2021  f/u ov/[place] office/Maryem Shuffler re: copd GOLD 2  maint on breztri  / still smoking / did not bring meds / no longer prednisone / facing gb surgery / Marlou Starks  ?Chief Complaint  ?Patient presents with  ? Follow-up  ?  Feels SOB and cough have improved since last OV   ? 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 ? ? ?07/19/2021  f/u ov/[place] office/Garrett Bowring re: GOLD 2 copd/MZ  maint on breztri  just 2 each am and way too much albuterol/ still smoking ?Chief Complaint  ?Patient presents with  ? Follow-up  ?  Cough has improved. Allergies bothering patient.   ?Dyspnea:  still does [employer] shopping leaning on car ?Cough: congested / mucoid esp in am  ?Sleeping: using neb at hs and in am helps but still noct wheeze ?SABA use: way too much  ?02: none  ?  ? ?No obvious day to day or daytime variability or assoc excess/ purulent sputum or mucus plugs or hemoptysis or cp or chest tightness or overt sinus or hb symptoms.  ? ?  Also denies any obvious fluctuation of symptoms with weather or environmental changes or other aggravating or alleviating factors except as outlined above  ? ?No unusual exposure hx or h/o childhood pna/ asthma or knowledge of premature birth. ? ?Current Allergies, Complete Past Medical History, Past Surgical History, Family History, and Social History were reviewed in [employer] record. ? ?ROS  The following are not active complaints unless bolded ?Hoarseness, sore throat, dysphagia, dental problems, itching, sneezing,  nasal congestion or discharge of excess mucus or purulent secretions, ear ache,   fever, chills, sweats, unintended wt loss or wt gain, classically pleuritic or exertional cp,  orthopnea pnd or arm/hand swelling  or leg swelling,  presyncope, palpitations, abdominal pain, anorexia, nausea, vomiting, diarrhea  or change in bowel habits or change in bladder habits, change in stools or change in urine, dysuria, hematuria,  rash, arthralgias, visual complaints, headache, numbness, weakness or ataxia or problems with walking or coordination,  change in mood or  memory. ?      ? ?Current Meds  ?Medication Sig  ? acetaminophen (TYLENOL) 325 MG tablet Take 650 mg by mouth  every 6 (six) hours as needed for moderate pain.  ? acetaminophen (TYLENOL) 650 MG CR tablet Take 650-1,300 mg by mouth every 8 (eight) hours as needed for pain.  ? albuterol (PROVENTIL HFA;VENTOLIN HFA) 108 (90 BASE) MCG/ACT inhaler Inhale 2 puffs into the lungs every 6 (six) hours as needed for shortness of breath.  ? albuterol (PROVENTIL) (2.5 MG/3ML) 0.083% nebulizer solution Take 3 mLs (2.5 mg total) by nebulization every 4 (four) hours as needed for wheezing or shortness of breath.  ? famotidine (PEPCID) 20 MG tablet One after supper  ? fluticasone (FLONASE) 50 MCG/ACT nasal spray Place 1 spray into both nostrils daily as needed for allergies or rhinitis.  ? HYDROcodone-acetaminophen (NORCO/VICODIN) 5-325 MG tablet Take 1 tablet by mouth every 6 (six) hours as needed for moderate pain or severe pain.  ? levothyroxine (SYNTHROID) 125 MCG tablet Take 125 mcg by mouth daily.  ? montelukast (SINGULAIR) 10 MG tablet Take 10 mg by mouth at bedtime.  ? Nitroglycerin 0.4 % OINT Place 1 application rectally 2 (two) times daily. Per rectum. Wear gloves while applying and wash hands thereafter.  ? pantoprazole (PROTONIX) 40 MG tablet Take 1 tablet (40 mg total) by mouth daily. 30 minutes before breakfast  ? vitamin B-12 (CYANOCOBALAMIN) 500 MCG tablet Take 500 mcg by mouth daily.  ? vitamin C (ASCORBIC ACID) 500 MG tablet Take 500 mg by mouth daily.  ?     ?  ? ?  ?  ? ?Past Medical History:  ?Diagnosis Date  ? Asthma   ? Cancer [facility])   ? Chronic abdominal pain   ? Chronic back pain   ?  Chronic neck pain   ? Chronic pain   ? Colon cancer ([place])   ? COPD (chronic obstructive pulmonary disease) ([place])   ? Hemorrhoids   ? IBS (irritable bowel syndrome)   ? Migraines   ? ? ?  ? ?Objective:  ?  ?Wts ?5/8/202

## 2021-07-19 NOTE — Patient Instructions (Addendum)
Plan A = Automatic = Always=    Breztri Take 2 puffs first thing in am and then another 2 puffs about 12 hours later.  ?  ?Work on inhaler technique:  relax and gently blow all the way out then take a nice smooth full deep breath back in, triggering the inhaler at same time you start breathing in.  Hold for up to 5 seconds if you can. Blow out thru nose. Rinse and gargle with water when done.  If mouth or throat bother you at all,  try brushing teeth/gums/tongue with arm and hammer toothpaste/ make a slurry and gargle and spit out.  ? ?   ? ?Plan B = Backup (to supplement plan A, not to replace it) ?Only use your albuterol inhaler as a rescue medication to be used if you can't catch your breath by resting or doing a relaxed purse lip breathing pattern.  ?- The less you use it, the better it will work when you need it. ?- Ok to use the inhaler up to 2 puffs  every 4 hours if you must but call for appointment if use goes up over your usual need ?- Don't leave home without it !!  (think of it like the spare tire for your car)  ? ?Plan C = Crisis (instead of Plan B but only if Plan B stops working) ?- only use your albuterol nebulizer if you first try Plan B and it fails to help > ok to use the nebulizer up to every 4 hours but if start needing it regularly call for immediate appointment ? ?  ?Prednisone 10 mg take  4 each am x 2 days,   2 each am x 2 days,  1 each am x 2 days and stop  ? ?Counseled re importance of smoking cessation but did not meet time criteria for separate billing   ? ?Please schedule a follow up office visit in 6 weeks, call sooner if needed with all medications /inhalers/ solutions in hand so we can verify exactly what you are taking. This includes all medications from all doctors and over the [employer] separate them into two bags:  the ones you take automatically, no matter what, vs the ones you take just when you feel you need them "BAG #2 is UP TO YOU"  - this will really help [place] help  you take your medications more effectively.  ?   ?

## 2021-07-20 ENCOUNTER — Encounter: Payer: Self-pay | Admitting: Internal Medicine

## 2021-07-20 NOTE — Assessment & Plan Note (Signed)
Counseled re importance of smoking cessation but did not meet time criteria for separate billing  ? ?Lung cancer screening per [facility] per pet  ? ? ?    ?  ? ?Each maintenance medication was reviewed in detail including emphasizing most importantly the difference between maintenance and prns and under what circumstances the prns are to be triggered using an action plan format where appropriate. ? ?Total time for H and P, chart review, counseling, reviewing dpi/hfa/neb device(s) and generating customized AVS unique to this office visit / same day charting = 35 min  ?     ?

## 2021-07-20 NOTE — Assessment & Plan Note (Signed)
Active smoker/ MZ ?- 07/29/2020  After extensive coaching inhaler device,  effectiveness =   spiriva smi 2.5 mg q am and prn saba ?- 08/11/2020 started daily pred 20 mg until better then 10 mg daily > caused thrush so stopped ?- PFT's 11/04/20   FEV1 1.31  (52 % ) ratio 0.53  p 20 % improvement from saba p ? prior to study with FV curve poor insp flow but no true plateau, classic exp concavity.  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 ? Group D (now reclassified as E) in terms of symptom/risk and laba/lama/ICS  therefore appropriate rx at this point >>>  Continue breztri and more approp use of saba  ? ?  ?Note ?Did not follow any of the instructions from previous visit so simply repeated them all today using an ABC action plan with one course Prednisone 10 mg take  4 each am x 2 days,   2 each am x 2 days,  1 each am x 2 days and stop to see if can reset her B2 receptors and reduce dependency on SABBA ?

## 2021-08-04 ENCOUNTER — Encounter: Payer: Self-pay | Admitting: Gastroenterology

## 2021-08-04 ENCOUNTER — Ambulatory Visit (INDEPENDENT_AMBULATORY_CARE_PROVIDER_SITE_OTHER): Payer: Medicare Other | Admitting: Gastroenterology

## 2021-08-04 VITALS — BP 138/100 | HR 82 | Temp 97.3°F | Ht 64.0 in | Wt 133.2 lb

## 2021-08-04 DIAGNOSIS — R197 Diarrhea, unspecified: Secondary | ICD-10-CM | POA: Diagnosis not present

## 2021-08-04 DIAGNOSIS — Z85038 Personal history of other malignant neoplasm of large intestine: Secondary | ICD-10-CM

## 2021-08-04 MED ORDER — COLESTIPOL HCL 1 G PO TABS: 2.0000 g | ORAL_TABLET | Freq: Two times a day (BID) | ORAL | 1 refills | Status: DC

## 2021-08-04 NOTE — Progress Notes (Signed)
Gastroenterology Office Note     Primary Care Physician:  Leeanne Rio, MD  Primary Gastroenterologist: Dr. Abbey Chatters   Chief Complaint   Chief Complaint  Patient presents with   Colonoscopy    Still has diarrhea     History of Present Illness   Paula Carrillo is a 66 y.o. female presenting today in follow-up with a history of colon cancer many years ago s/p resection, last colonoscopy 2014 with single polyp in ascending colon, two sessile polyps in rectum, large internal hemorrhoids s/p banding X 3. Pathology: tubular adenoma and hyperplastic.   Nov 2022 cholecystectomy. Has had looser stool since that time. Has noted chronic history of looser stool off and on.   No rectal bleeding. Pantoprazole once daily for GERD.   Past Medical History:  Diagnosis Date   Arthritis    Asthma    Cancer ([place])    Chronic abdominal pain    Chronic back pain    Chronic neck pain    Chronic pain    Colon cancer ([facility])    COPD (chronic obstructive pulmonary disease) (H[facility])    Hemorrhoids    IBS (irritable bowel syndrome)    Migraines    Thyroid cancer ([place])    Thyroid disease     Past Surgical History:  Procedure Laterality Date   ABDOMINAL HYSTERECTOMY     APPENDECTOMY     BREAST LUMPECTOMY     pt denies this   CHOLECYSTECTOMY N/A 01/18/2021   Procedure: LAPAROSCOPIC CHOLECYSTECTOMY WITH INTRAOPERATIVE CHOLANGIOGRAM;  Surgeon: Jovita Kussmaul, MD;  Location: [place];  Service: General;  Laterality: N/A;   COLON RESECTION     COLONOSCOPY  07/17/2009   NFA:OZHYQ internal hemorrhoids/tortuous colon/3-mm sessile hepatic polyp/sigmoid colon diverticula, small internal hemorrhoids, path: tubular adenoma   COLONOSCOPY N/A 07/23/2012   [facility]:QIONGE polyp in the ascending colon/Two sessile polyps in the rectum/RECTAL BLEEDING DUE TO Large internal hemorrhoids-S/P BANDINGx3   THYROIDECTOMY N/A 12/16/2020   Procedure: TOTAL THYROIDECTOMY;  Surgeon: Jason Coop, DO;  Location: [facility]  OR;  Service: ENT;  Laterality: N/A;    Current Outpatient Medications  Medication Sig Dispense Refill   acetaminophen (TYLENOL) 325 MG tablet Take 650 mg by mouth every 6 (six) hours as needed for moderate pain.     acetaminophen (TYLENOL) 650 MG CR tablet Take 650-1,300 mg by mouth every 8 (eight) hours as needed for pain.     albuterol (PROVENTIL HFA;VENTOLIN HFA) 108 (90 BASE) MCG/ACT inhaler Inhale 2 puffs into the lungs every 6 (six) hours as needed for shortness of breath.     albuterol (PROVENTIL) (2.5 MG/3ML) 0.083% nebulizer solution Take 3 mLs (2.5 mg total) by nebulization every 4 (four) hours as needed for wheezing or shortness of breath. 75 mL 1   Calcium Carbonate-Vitamin D3 600-400 MG-UNIT TABS Take 2 tablets by mouth in the morning and at bedtime. 120 tablet 2   famotidine (PEPCID) 20 MG tablet One after supper 30 tablet 11   fluticasone (FLONASE) 50 MCG/ACT nasal spray Place 1 spray into both nostrils daily as needed for allergies or rhinitis.     levothyroxine (SYNTHROID) 125 MCG tablet Take 125 mcg by mouth daily.     montelukast (SINGULAIR) 10 MG tablet Take 10 mg by mouth at bedtime.     Nitroglycerin 0.4 % OINT Place 1 application rectally 2 (two) times daily. Per rectum. Wear gloves while applying and wash hands thereafter. 30 g 0   pantoprazole (PROTONIX) 40 MG  tablet Take 1 tablet (40 mg total) by mouth daily. 30 minutes before breakfast 90 tablet 3   vitamin B-12 (CYANOCOBALAMIN) 500 MCG tablet Take 500 mcg by mouth daily.     vitamin C (ASCORBIC ACID) 500 MG tablet Take 500 mg by mouth daily.     No current facility-administered medications for this visit.    Allergies as of 08/04/2021 - Review Complete 08/04/2021  Allergen Reaction Noted   Sulfa antibiotics Hives and Itching 07/11/2012    Family History  Problem Relation Age of Onset   Colon cancer Neg Hx    Hypertension Mother    CVA Mother    Parkinson's disease Mother    Emphysema Father     Social  History   Socioeconomic History   Marital status: Legally Separated    Spouse name: Not on file   Number of children: Not on file   Years of education: Not on file   Highest education level: Not on file  Occupational History   Not on file  Tobacco Use   Smoking status: Every Day    Packs/day: 1.00    Types: Cigarettes   Smokeless tobacco: Former    Quit date: 05/09/1974   Tobacco comments:    smokes 1 pack per day 07/29/20  Vaping Use   Vaping Use: Never used  Substance and Sexual Activity   Alcohol use: No    Alcohol/week: 0.0 standard drinks   Drug use: No   Sexual activity: Not Currently  Other Topics Concern   Not on file  Social History Narrative   Not on file   Social Determinants of Health   Financial Resource Strain: Not on file  Food Insecurity: Not on file  Transportation Needs: Not on file  Physical Activity: Not on file  Stress: Not on file  Social Connections: Not on file  Intimate Partner Violence: Not on file     Review of Systems   Gen: Denies any fever, chills, fatigue, weight loss, lack of appetite.  CV: Denies chest pain, heart palpitations, peripheral edema, syncope.  Resp: Denies shortness of breath at rest or with exertion. Denies wheezing or cough.  GI: see HPI GU : Denies urinary burning, urinary frequency, urinary hesitancy MS: Denies joint pain, muscle weakness, cramps, or limitation of movement.  Derm: Denies rash, itching, dry skin Psych: Denies depression, anxiety, memory loss, and confusion Heme: Denies bruising, bleeding, and enlarged lymph nodes.   Physical Exam   BP (!) 138/100 (BP Location: Right Arm, Patient Position: Sitting, Cuff Size: Normal)   Pulse 82   Temp (!) 97.3 F (36.3 C) (Temporal)   Ht '5\' 4"'$  (1.626 m)   Wt 133 lb 3.2 oz (60.4 kg)   SpO2 96%   BMI 22.86 kg/m  General:   Alert and oriented. Pleasant and cooperative. Well-nourished and well-developed.  Head:  Normocephalic and atraumatic. Eyes:  Without  icterus Lungs: coarse, scattered rhonchi Cardiac: S1 S2 present without murmurs Abdomen:  +BS, soft, non-tender and non-distended. No HSM noted. No guarding or rebound. No masses appreciated.  Rectal:  Deferred  Msk:  Symmetrical without gross deformities. Normal posture. Extremities:  Without edema. Neurologic:  Alert and  oriented x4;  grossly normal neurologically. Skin:  Intact without significant lesions or rashes. Psych:  Alert and cooperative. Normal mood and affect.   Assessment   Paula Carrillo is a 66 y.o. female presenting today in follow-up with a history of colon cancer many years ago s/p resection, last colonoscopy  2014 with single polyp in ascending colon, two sessile polyps in rectum, large internal hemorrhoids s/p banding X 3. Pathology: tubular adenoma and hyperplastic.   Needs surveillance colonoscopy at this time. Now noting intermittent looser stool since cholecystectomy Nov 2022. 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 Not consistent with infectious process. Unable to rule out microscopic colitis but less likely.   GERD: pantoprazole once daily controls symptoms.      PLAN    Proceed with colonoscopy by Dr. Abbey Chatters  in near future with random colonic biopsies: the risks, benefits, and alternatives have been discussed with the patient in detail. The patient states understanding and desires to proceed.  Trial of Colestid once to twice a day Pantoprazole daily 6 month return   Annitta Needs, PhD, [facility] Gastroenterology

## 2021-08-04 NOTE — Patient Instructions (Signed)
I have sent in a medication called Colestid to your pharmacy. Take 2 tablets once in the morning. If you do well with this, you can take it twice a day! Monitor for constipation and decrease dose if this occurs.  We are arranging a colonoscopy with Dr. Abbey Chatters in the near future!  We will see you in 6 months!  I enjoyed seeing you again today! As you know, I value our relationship and want to provide genuine, compassionate, and quality care. I welcome your feedback. If you receive a survey regarding your visit,  I greatly appreciate you taking time to fill this out. See you next time!  Annitta Needs, PhD, ANP-BC [facility] Gastroenterology

## 2021-08-05 ENCOUNTER — Other Ambulatory Visit: Payer: Self-pay

## 2021-08-05 ENCOUNTER — Telehealth: Payer: Self-pay

## 2021-08-05 MED ORDER — CLENPIQ 10-3.5-12 MG-GM -GM/175ML PO SOLN: 1.0000 | ORAL | 0 refills | Status: DC

## 2021-08-05 NOTE — Telephone Encounter (Signed)
PA for TCS submitted via [facility] website. PA# [id number], valid 09/06/21-12/05/21.

## 2021-08-31 ENCOUNTER — Encounter: Payer: Self-pay | Admitting: Internal Medicine

## 2021-08-31 ENCOUNTER — Ambulatory Visit (INDEPENDENT_AMBULATORY_CARE_PROVIDER_SITE_OTHER): Payer: Medicare Other | Admitting: Internal Medicine

## 2021-08-31 DIAGNOSIS — F17210 Nicotine dependence, cigarettes, uncomplicated: Secondary | ICD-10-CM | POA: Diagnosis not present

## 2021-08-31 DIAGNOSIS — J449 Chronic obstructive pulmonary disease, unspecified: Secondary | ICD-10-CM | POA: Diagnosis not present

## 2021-08-31 NOTE — Progress Notes (Signed)
Paula Carrillo, female    DOB: March 24, 1955     MRN: [id number]   Brief patient profile:  58 yowf MZ/active smoker says dx with copd around 2012 and maint on advair and spiriva and referred to pulmonary clinic in [place]  07/29/2020 by Dr   Huel Cote with worsening sob/cough with globus on advair/spiriva dpi    07/13/20 ent eval: moderate interarytenoid edema and erythema consistent with laryngopharyngeal reflux. 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 Referred to [facility] voice center Thyroid nodule x 4 cm > thyroid US > nov 7th thyroidectomy at [facility]     History of Present Illness  07/29/2020  Pulmonary/ 1st office eval/ Paula Carrillo / [facility]  Chief Complaint  Patient presents with   Consult    Productive cough with white phlegm, sore throat "something growing in it" since March 2022  Dyspnea:  [employer] shopping but has to go to slow stops each aisle = MMRC3 = can't walk 100 yards even at a slow pace at a flat grade s stopping due to sob   Cough: sense of throat congestion/ globus  on advair/ spiriva  Sleep: < 30 degrees /bed is flat with pillows  SABA use: very confused re how/ when to use it 02  None  rec The key is to stop smoking completely before smoking completely stops you! Plan A = Automatic = Always=    spiriva 2 puffs each am and stop powdered advair and spiriva  Prednisone 10 mg take  4 each am x 2 days,   2 each am x 2 days,  1 each am x 2 days and stop  Plan B = Backup (to supplement plan A, not to replace it) Only use your albuterol inhaler as a rescue medicatio Plan C = Crisis (instead of Plan B but only if Plan B stops working) - only use your albuterol nebulizer if you first try Plan B and it fails to help > ok to use the nebulizer up to every 4 hours but if start needing it regularly call for immediate appointment GERD  diet  Please schedule a follow up office visit in 2 weeks, call sooner if needed with all  medications /inhalers/ solutions in hand so we can verify exactly what you are taking. This includes all medications from all doctors and over the counters      11/12/2020  f/u ov/[place] office/Paula Carrillo re: prob copd/ cb maint on spiriva 2.5 2 each am but not clear taking it / easily confused with meds  / needs GB surgery Chief Complaint  Patient presents with   Follow-up    Patient says her cough is about the same since she last saw Dr.Leanny Moeckel. She is coughing up clear mucus She reports some increased shortness of breath. No oxygen at home.    Dyspnea:  across the room  Cough: congested mucoid worse in am  Sleeping: sleeping flat/ lots of cough  SABA use: twice daily  02: no 02  Covid status: never infected/ never vaccinated  Rec Stop spiriva Plan A = Automatic = Always=  Breztri Take 2 puffs first thing in am and then another 2 puffs about 12 hours later.   Work on inhaler technique:  Plan B = Backup (to supplement plan A, not to replace it) Only use your albuterol inhaler as a rescue medication ar)  Plan C = Crisis (instead of Plan B but only if Plan B stops working) - only  use your albuterol nebulizer if you first try Plan B Pantoprazole (protonix) 40 mg   Take  30-60 min before first meal of the day and Pepcid (famotidine)  20 mg after supper until return to office  For cough > mucinex dm 1200 mg every 12 hours as needed  8 inch blocks under head of bed  Depomedrol 120 mg IM today  Please schedule a follow up office visit in 6 weeks, call sooner if needed - always bring all active medications /inhalers / solutions  - call me with discrepancy   12/16/20  [facility] thyroidectomy    01/12/2021  f/u ov/[place] office/Paula Carrillo re: copd GOLD 2  maint on breztri  / still smoking / did not bring meds / no longer prednisone / facing gb surgery / Marlou Starks  Chief Complaint  Patient presents with   Follow-up    Feels SOB and cough have improved since last OV    Dyspnea:  now able to do [employer] shopping  but  still doe x steps = MMRC1 = can walk nl pace, flat grade, can't hurry or go uphills or steps s sob   Cough: some at hs / does not keep her up > min mucoid  Sleeping: flat bed / 2 pillows / am congestion better  SABA use: not needing  02: none  Covid status: never vax or infected  Lung cancer screening: ordered by [facility]  Rec 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 Allergies bothering patient.   Dyspnea:  still does [employer] shopping leaning on cart Cough: congested / mucoid esp in am  Sleeping: using neb at hs and in am helps but still noct wheeze SABA use: way too much  02: none   Rec Plan A = Automatic = Always=    Breztri Take 2 puffs first thing in am and then another 2 puffs about 12 hours later.   Work on inhaler technique:  Plan B = Backup (to supplement plan A, not to replace it) Only use your albuterol inhaler as a rescue medication Plan C = Crisis (instead of Plan B but only if Plan B stops working) - only use your albuterol nebulizer if you first try Plan B and it fails to help > ok to use the nebulizer up to every 4 hours but if start needing it regularly call for immediate appointment Prednisone 10 mg take  4 each am x 2 days,   2 each am x 2 days,  1 each am x 2 days and stop  Please schedule a follow up office visit in 6 weeks, call sooner if needed with all medications /inhalers/ solutions in hand   08/31/2021  f/u ov/Paula Carrillo office/Paula Carrillo re: GOLD 2 COPD  maint on breztri 2bid   Chief Complaint  Patient presents with   Follow-up    Cough is improving    Dyspnea:  [employer] shopping / cross parking lot s  02  Cough: better but still smoker worse in am  Sleeping: bed is flat/ raises head sev pillows SABA use: avg twice daily /  02:  none  Covid status: none, never infected  Lung cancer screening: referred today    No obvious day to day or daytime variability or assoc excess/ purulent sputum or mucus plugs or hemoptysis or cp or chest tightness, subjective wheeze or overt sinus or hb symptoms.   Sleeping  without nocturnal  or early am exacerbation  of respiratory  c/o's or need for noct saba. Also denies any obvious fluctuation of symptoms with weather or environmental changes or other aggravating or alleviating factors except as outlined above   No unusual exposure hx or h/o childhood pna/ asthma or knowledge of premature birth.  Current Allergies, Complete Past Medical History, Past Surgical History, Family History, and Social History were reviewed in [employer] record.  ROS  The following are not active complaints unless bolded Hoarseness, sore throat, dysphagia, dental problems, itching, sneezing,  nasal congestion or discharge of excess mucus or purulent secretions, ear ache,   fever, chills, sweats, unintended wt loss or wt gain, classically pleuritic or exertional cp,  orthopnea pnd or arm/hand swelling  or leg swelling, presyncope, palpitations, abdominal pain, anorexia, nausea, vomiting, diarrhea  or change in bowel habits or change in bladder habits, change in stools or change in urine, dysuria, hematuria,  rash, arthralgias, visual complaints, headache, numbness, weakness or ataxia or problems with walking or coordination,  change in mood or  memory.        Current Meds  Medication Sig   acetaminophen (TYLENOL) 650 MG CR tablet Take 650-1,300 mg by mouth every 8 (eight) hours as needed for pain.   albuterol (PROVENTIL HFA;VENTOLIN HFA) 108 (90 BASE) MCG/ACT inhaler Inhale 2 puffs into the lungs every 6 (six) hours as needed for shortness of breath.   albuterol  (PROVENTIL) (2.5 MG/3ML) 0.083% nebulizer solution Take 3 mLs (2.5 mg total) by nebulization every 4 (four) hours as needed for wheezing or shortness of breath.   Budeson-Glycopyrrol-Formoterol (BREZTRI AEROSPHERE) 160-9-4.8 MCG/ACT AERO Inhale into the lungs.   colestipol (COLESTID) 1 g tablet Take 1 g by mouth daily.   diphenhydrAMINE (BENADRYL) 25 MG tablet Take 25 mg by mouth daily as needed for allergies.   famotidine (PEPCID) 20 MG tablet One after supper (Patient taking differently: Take 20 mg by mouth every evening. One after supper)   fluticasone (FLONASE) 50 MCG/ACT nasal spray Place 1 spray into both nostrils daily as needed for allergies or rhinitis.   levothyroxine (SYNTHROID) 112 MCG tablet Take 112 mcg by mouth daily before breakfast.   Nitroglycerin 0.4 % OINT Place 1 application rectally 2 (two) times daily. Per rectum. Wear gloves while applying and wash hands thereafter.   pantoprazole (PROTONIX) 40 MG tablet Take 1 tablet (40 mg total) by mouth daily. 30 minutes before breakfast   Sod Picosulfate-Mag Ox-Cit Acd (CLENPIQ) 10-3.5-12 MG-GM -GM/175ML SOLN Take 1 kit by mouth as directed.   vitamin B-12 (CYANOCOBALAMIN) 500 MCG tablet Take 500 mcg by mouth daily.   vitamin C (ASCORBIC ACID) 500 MG tablet Take 500 mg by mouth daily.             Past Medical History:  Diagnosis Date   Asthma    Cancer ([place])    Chronic abdominal pain    Chronic back pain    Chronic neck pain    Chronic pain    Colon cancer (HCC)    COPD (chronic obstructive pulmonary disease) (HCC)    Hemorrhoids    IBS (irritable bowel syndrome)    Migraines  Objective:    Wts  08/31/2021        132  07/19/2021          135 01/12/2021        139   11/12/2020        140   07/29/20 140 lb 12.8 oz (63.9 kg)  03/31/15 165 lb 9.6 oz (75.1 kg)  08/11/14 170 lb (77.1 kg)     Vital signs reviewed  08/31/2021  - Note at rest 02 sats  97% on RA   General appearance:      Amb wf smoker's rattle    HEENT : Oropharynx  clear   Nasal turbinates nl    NECK :  without  apparent JVD/ palpable Nodes/TM    LUNGS: no acc muscle use,  Mild barrel  contour chest wall with bilateral  Distant bs s audible wheeze and  without cough on insp or exp maneuvers  and mild  Hyperresonant  to  percussion bilaterally     CV:  RRR  no s3 or murmur or increase in P2, and no edema   ABD:  soft and nontender with pos end  insp Hoover's  in the supine position.  No bruits or organomegaly appreciated   MS:  Nl gait/ ext warm without deformities Or obvious joint restrictions  calf tenderness, cyanosis or clubbing     SKIN: warm and dry without lesions    NEURO:  alert, approp, nl sensorium with  no motor or cerebellar deficits apparent.              Assessment

## 2021-08-31 NOTE — Patient Instructions (Signed)
The key is to stop smoking completely before smoking completely stops you!  My office will be contacting you by phone for referral for lung cancer screening   Plan A = Automatic = Always=    breztri Take 2 puffs first thing in am and then another 2 puffs about 12 hours later.     Plan B = Backup (to supplement plan A, not to replace it) Only use your albuterol inhaler as a rescue medication to be used if you can't catch your breath by resting or doing a relaxed purse lip breathing pattern.  - The less you use it, the better it will work when you need it. - Ok to use the inhaler up to 2 puffs  every 4 hours if you must but call for appointment if use goes up over your usual need - Don't leave home without it !!  (think of it like the spare tire for your car)   Plan C = Crisis (instead of Plan B but only if Plan B stops working) - only use your albuterol nebulizer if you first try Plan B and it fails to help > ok to use the nebulizer up to every 4 hours but if start needing it regularly call for immediate appointment    Please schedule a follow up visit in 12  months but call sooner if needed

## 2021-08-31 NOTE — Assessment & Plan Note (Signed)
Referred for LDCT  08/31/2021 >>>   Low-dose CT lung cancer screening is recommended for patients who are 33-65 years of age with a 20+ pack-year history of smoking and who are currently smoking or quit <=15 years ago. No coughing up blood  No unintentional weight loss of > 15 pounds in the last 6 months - pt is eligible for scanning yearly for at least the next 15 y > referred

## 2021-08-31 NOTE — Patient Instructions (Signed)
Paula Carrillo  08/31/2021     '@PREFPERIOPPHARMACY'$ @   Your procedure is scheduled on  09/06/2021.   Report to [facility] at  1245 P.M.   Call this number if you have problems the morning of surgery:  [phone]   Remember:  Follow the diet and prep instructions given to you by the office.     Use your nebulizer and your inhaler before you come and bring your rescue inhaler with you.    Take these medicines the morning of surgery with A SIP OF WATER       levothyroxine, protonix, tramadol(if needed).     Do not wear jewelry, make-up or nail polish.  Do not wear lotions, powders, or perfumes, or deodorant.  Do not shave 48 hours prior to surgery.  Men may shave face and neck.  Do not bring valuables to the hospital.  [facility] is not responsible for any belongings or valuables.  Contacts, dentures or bridgework may not be worn into surgery.  Leave your suitcase in the car.  After surgery it may be brought to your room.  For patients admitted to the hospital, discharge time will be determined by your treatment team.  Patients discharged the day of surgery will not be allowed to drive home and must have someone with them for 24 hours.    Special instructions:   DO NOT smoke tobacco or vape for 24 hours before your procedure.  Please read over the following fact sheets that you were given. Anesthesia Post-op Instructions and Care and Recovery After Surgery      Colonoscopy, Adult, Care After The following information offers guidance on how to care for yourself after your procedure. Your health care provider may also give you more specific instructions. If you have problems or questions, contact your health care provider. What can I expect after the procedure? After the procedure, it is common to have: A small amount of blood in your stool for 24 hours after the procedure. Some gas. Mild cramping or bloating of your abdomen. Follow these instructions at  home: Eating and drinking  Drink enough fluid to keep your urine pale yellow. Follow instructions from your health care provider about eating or drinking restrictions. Resume your normal diet as told by your health care provider. Avoid heavy or fried foods that are hard to digest. Activity Rest as told by your health care provider. Avoid sitting for a long time without moving. Get up to take short walks every 1-2 hours. This is important to improve blood flow and breathing. Ask for help if you feel weak or unsteady. Return to your normal activities as told by your health care provider. Ask your health care provider what activities are safe for you. Managing cramping and bloating  Try walking around when you have cramps or feel bloated. If directed, apply heat to your abdomen as told by your health care provider. Use the heat source that your health care provider recommends, such as a moist heat pack or a heating pad. Place a towel between your skin and the heat source. Leave the heat on for 20-30 minutes. Remove the heat if your skin turns bright red. This is especially important if you are unable to feel pain, heat, or cold. You have a greater risk of getting burned. General instructions If you were given a sedative during the procedure, it can affect you for several hours. Do not drive or operate machinery until your  health care provider says that it is safe. For the first 24 hours after the procedure: Do not sign important documents. Do not drink alcohol. Do your regular daily activities at a slower pace than normal. Eat soft foods that are easy to digest. Take over-the-counter and prescription medicines only as told by your health care provider. Keep all follow-up visits. This is important. Contact a health care provider if: You have blood in your stool 2-3 days after the procedure. Get help right away if: You have more than a small spotting of blood in your stool. You have large  blood clots in your stool. You have swelling of your abdomen. You have nausea or vomiting. You have a fever. You have increasing pain in your abdomen that is not relieved with medicine. These symptoms may be an emergency. Get help right away. Call 911. Do not wait to see if the symptoms will go away. Do not drive yourself to the hospital. Summary After the procedure, it is common to have a small amount of blood in your stool. You may also have mild cramping and bloating of your abdomen. If you were given a sedative during the procedure, it can affect you for several hours. Do not drive or operate machinery until your health care provider says that it is safe. Get help right away if you have a lot of blood in your stool, nausea or vomiting, a fever, or increased pain in your abdomen. This information is not intended to replace advice given to you by your health care provider. Make sure you discuss any questions you have with your health care provider. Document Revised: 10/21/2020 Document Reviewed: 10/21/2020 Elsevier Patient Education  [place] After This sheet gives you information about how to care for yourself after your procedure. Your health care provider may also give you more specific instructions. If you have problems or questions, contact your health care provider. What can I expect after the procedure? After the procedure, it is common to have: Tiredness. Forgetfulness about what happened after the procedure. Impaired judgment for important decisions. Nausea or vomiting. Some difficulty with balance. Follow these instructions at home: For the time period you were told by your health care provider:     Rest as needed. Do not participate in activities where you could fall or become injured. Do not drive or use machinery. Do not drink alcohol. Do not take sleeping pills or medicines that cause drowsiness. Do not make important  decisions or sign legal documents. Do not take care of children on your own. Eating and drinking Follow the diet that is recommended by your health care provider. Drink enough fluid to keep your urine pale yellow. If you vomit: Drink water, juice, or soup when you can drink without vomiting. Make sure you have little or no nausea before eating solid foods. General instructions Have a responsible adult stay with you for the time you are told. It is important to have someone help care for you until you are awake and alert. Take over-the-counter and prescription medicines only as told by your health care provider. If you have sleep apnea, surgery and certain medicines can increase your risk for breathing problems. Follow instructions from your health care provider about wearing your sleep device: Anytime you are sleeping, including during daytime naps. While taking prescription pain medicines, sleeping medicines, or medicines that make you drowsy. Avoid smoking. Keep all follow-up visits as told by your health care provider. This is  important. Contact a health care provider if: You keep feeling nauseous or you keep vomiting. You feel light-headed. You are still sleepy or having trouble with balance after 24 hours. You develop a rash. You have a fever. You have redness or swelling around the IV site. Get help right away if: You have trouble breathing. You have new-onset confusion at home. Summary For several hours after your procedure, you may feel tired. You may also be forgetful and have poor judgment. Have a responsible adult stay with you for the time you are told. It is important to have someone help care for you until you are awake and alert. Rest as told. Do not drive or operate machinery. Do not drink alcohol or take sleeping pills. Get help right away if you have trouble breathing, or if you suddenly become confused. This information is not intended to replace advice given to you  by your health care provider. Make sure you discuss any questions you have with your health care provider. Document Revised: 02/02/2021 Document Reviewed: 01/31/2019 Elsevier Patient Education  [place].

## 2021-08-31 NOTE — Assessment & Plan Note (Addendum)
Active smoker/ Paula Carrillo - 07/29/2020  After extensive coaching inhaler device,  effectiveness =   spiriva smi 2.5 mg q am and prn saba - 08/11/2020 started daily pred 20 mg until better then 10 mg daily > caused thrush so stopped - PFT's 11/04/20   FEV1 1.31  (52 % ) ratio 0.53  p 20 % improvement from saba p ? prior to study with FV curve poor insp flow but no true plateau, classic exp concavity.  - 11/12/2020    try breztri 2bid/ depomedrol 120 mg IM and approp saba Labs ordered 11/12/2020  :  allergy profile Eos 0.5   and IgE = 67   alpha one AT phenotype  Paula Carrillo  Level 93    - 08/31/2021  After extensive coaching inhaler device,  effectiveness =    90% > continue breztri with more approp saba    Group D (now reclassified as E) in terms of symptom/risk and laba/lama/ICS  therefore appropriate rx at this point >>>  breztri 2bid and saba prn  Re SABA :  I spent extra time with pt today reviewing appropriate use of albuterol for prn use on exertion with the following points: 1) saba is for relief of sob that does not improve by walking a slower pace or resting but rather if the pt does not improve after trying this first. 2) If the pt is convinced, as many are, that saba helps recover from activity faster then it's easy to tell if this is the case by re-challenging : ie stop, take the inhaler, then p 5 minutes try the exact same activity (intensity of workload) that just caused the symptoms and see if they are substantially diminished or not after saba 3) if there is an activity that reproducibly causes the symptoms, try the saba 15 min before the activity on alternate days   If in fact the saba really does help, then fine to continue to use it prn but advised may need to look closer at the maintenance regimen being used to achieve better control of airways disease with exertion.   F/u yearly / again advised re Paula Carrillo significance for her and her blood relatives          Each maintenance medication was reviewed in  detail including emphasizing most importantly the difference between maintenance and prns and under what circumstances the prns are to be triggered using an action plan format where appropriate.  Total time for H and P, chart review, counseling, reviewing hfa device(s) and generating customized AVS unique to this office visit / same day charting = 20 min

## 2021-09-01 ENCOUNTER — Encounter (HOSPITAL_COMMUNITY)
Admission: RE | Admit: 2021-09-01 | Discharge: 2021-09-01 | Disposition: A | Discharge status: Home / Self Care | Payer: Medicare Other | Source: Ambulatory Visit | Attending: Internal Medicine | Admitting: Internal Medicine

## 2021-09-06 ENCOUNTER — Ambulatory Visit (HOSPITAL_BASED_OUTPATIENT_CLINIC_OR_DEPARTMENT_OTHER): Payer: Medicare Other | Admitting: Certified Registered"

## 2021-09-06 ENCOUNTER — Encounter (HOSPITAL_COMMUNITY): Payer: Self-pay

## 2021-09-06 ENCOUNTER — Ambulatory Visit (HOSPITAL_COMMUNITY): Payer: Medicare Other | Admitting: Certified Registered"

## 2021-09-06 ENCOUNTER — Ambulatory Visit (HOSPITAL_COMMUNITY)
Admission: RE | Admit: 2021-09-06 | Discharge: 2021-09-06 | Disposition: A | Discharge status: Home / Self Care | Payer: Medicare Other | Attending: Internal Medicine | Admitting: Internal Medicine

## 2021-09-06 ENCOUNTER — Encounter (HOSPITAL_COMMUNITY)
Admission: RE | Disposition: A | Discharge status: Home / Self Care | Payer: Self-pay | Source: Home / Self Care | Attending: Internal Medicine

## 2021-09-06 DIAGNOSIS — J449 Chronic obstructive pulmonary disease, unspecified: Secondary | ICD-10-CM | POA: Insufficient documentation

## 2021-09-06 DIAGNOSIS — Z08 Encounter for follow-up examination after completed treatment for malignant neoplasm: Secondary | ICD-10-CM | POA: Diagnosis not present

## 2021-09-06 DIAGNOSIS — K219 Gastro-esophageal reflux disease without esophagitis: Secondary | ICD-10-CM | POA: Diagnosis not present

## 2021-09-06 DIAGNOSIS — K648 Other hemorrhoids: Secondary | ICD-10-CM | POA: Diagnosis not present

## 2021-09-06 DIAGNOSIS — Z1211 Encounter for screening for malignant neoplasm of colon: Secondary | ICD-10-CM | POA: Diagnosis present

## 2021-09-06 DIAGNOSIS — F17210 Nicotine dependence, cigarettes, uncomplicated: Secondary | ICD-10-CM | POA: Insufficient documentation

## 2021-09-06 DIAGNOSIS — Z85038 Personal history of other malignant neoplasm of large intestine: Secondary | ICD-10-CM | POA: Insufficient documentation

## 2021-09-06 DIAGNOSIS — K649 Unspecified hemorrhoids: Secondary | ICD-10-CM | POA: Diagnosis not present

## 2021-09-06 DIAGNOSIS — Z86010 Personal history of colonic polyps: Secondary | ICD-10-CM | POA: Insufficient documentation

## 2021-09-06 HISTORY — PX: BIOPSY: SHX5522

## 2021-09-06 HISTORY — PX: COLONOSCOPY WITH PROPOFOL: SHX5780

## 2021-09-06 SURGERY — COLONOSCOPY WITH PROPOFOL
Anesthesia: General

## 2021-09-06 MED ORDER — LIDOCAINE 2% (20 MG/ML) 5 ML SYRINGE
INTRAMUSCULAR | Status: DC | PRN
  Administered 2021-09-06: 50 mg via INTRAVENOUS

## 2021-09-06 MED ORDER — PROPOFOL 500 MG/50ML IV EMUL
INTRAVENOUS | Status: DC | PRN
  Administered 2021-09-06: 200 ug/kg/min via INTRAVENOUS

## 2021-09-06 MED ORDER — PROPOFOL 10 MG/ML IV BOLUS
INTRAVENOUS | Status: DC | PRN
  Administered 2021-09-06: 60 mg via INTRAVENOUS

## 2021-09-06 MED ORDER — LACTATED RINGERS IV SOLN
INTRAVENOUS | Status: DC
  Administered 2021-09-06: 1000 mL via INTRAVENOUS

## 2021-09-06 MED ORDER — ALBUTEROL SULFATE HFA 108 (90 BASE) MCG/ACT IN AERS
INHALATION_SPRAY | RESPIRATORY_TRACT | Status: AC
  Filled 2021-09-06: qty 6.7

## 2021-09-06 MED ORDER — ALBUTEROL SULFATE HFA 108 (90 BASE) MCG/ACT IN AERS
INHALATION_SPRAY | RESPIRATORY_TRACT | Status: DC | PRN
  Administered 2021-09-06: 2 via RESPIRATORY_TRACT

## 2021-09-08 LAB — SURGICAL PATHOLOGY

## 2021-09-08 NOTE — Anesthesia Postprocedure Evaluation (Signed)
Anesthesia Post Note  Patient: Paula Carrillo  Procedure(s) Performed: COLONOSCOPY WITH PROPOFOL BIOPSY  Patient location during evaluation: Phase II Anesthesia Type: General Level of consciousness: awake Pain management: pain level controlled Vital Signs Assessment: post-procedure vital signs reviewed and stable Respiratory status: spontaneous breathing and respiratory function stable Cardiovascular status: blood pressure returned to baseline and stable Postop Assessment: no headache and no apparent nausea or vomiting Anesthetic complications: no Comments: Late entry   No notable events documented.   Last Vitals:  Vitals:   09/06/21 1239 09/06/21 1316  BP: 120/74 117/64  Pulse: 64 66  Resp: 18 17  Temp: 36.7 C 36.6 C  SpO2: 96% 98%    Last Pain:  Vitals:   09/06/21 1316  TempSrc: Axillary  PainSc:                  Louann Sjogren

## 2021-09-10 ENCOUNTER — Encounter (HOSPITAL_COMMUNITY): Payer: Self-pay | Admitting: Internal Medicine

## 2021-12-11 IMAGING — US US THYROID
1 series · 13 of 25 positions shown · non-contrast
Comparison: None.

CLINICAL DATA: Palpable abnormality. Palpable nodule on physical
examination.

EXAM:
THYROID ULTRASOUND
TECHNIQUE: Ultrasound examination of the thyroid gland and adjacent soft
tissues was performed.

[Series 1: us thyroid · 0.07mm/px · 55 acquisitions, 13 frames shown]
[im 1/55]
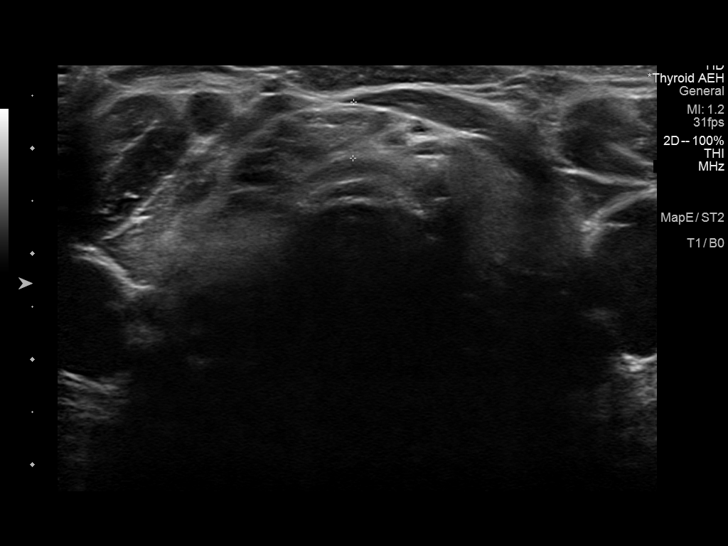
[im 5/55]
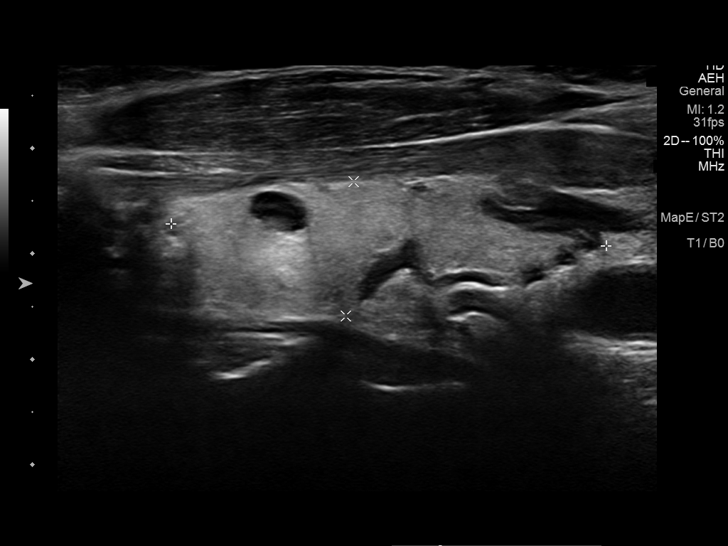
[im 10/55]
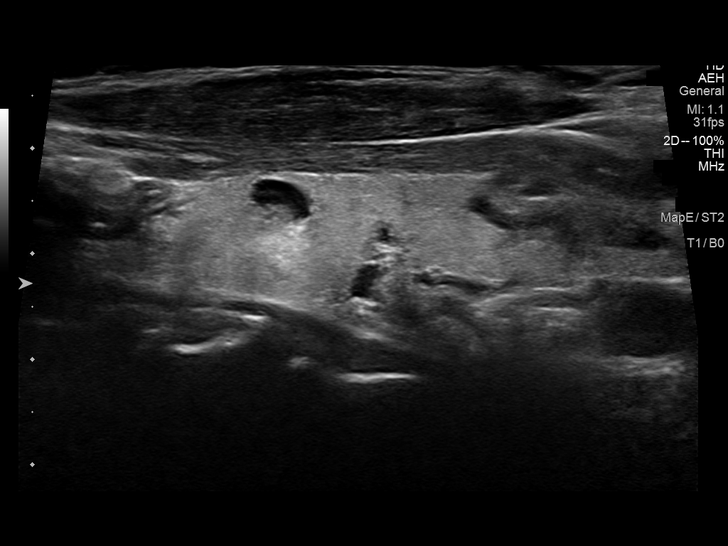
[im 14/55]
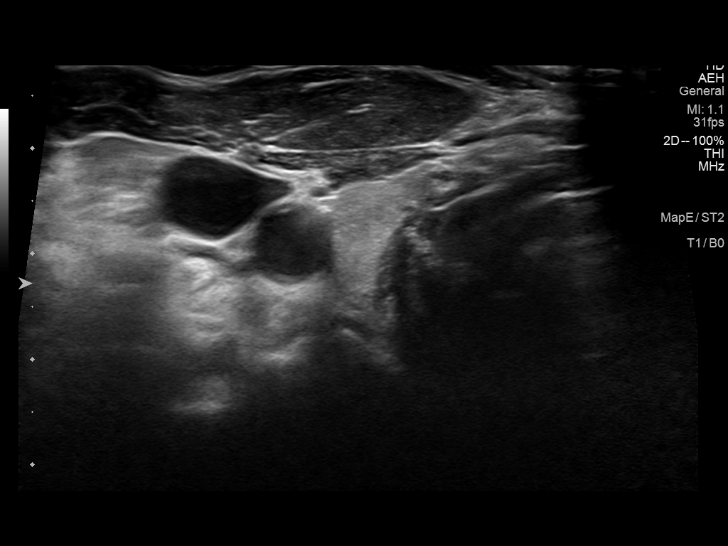
[im 19/55]
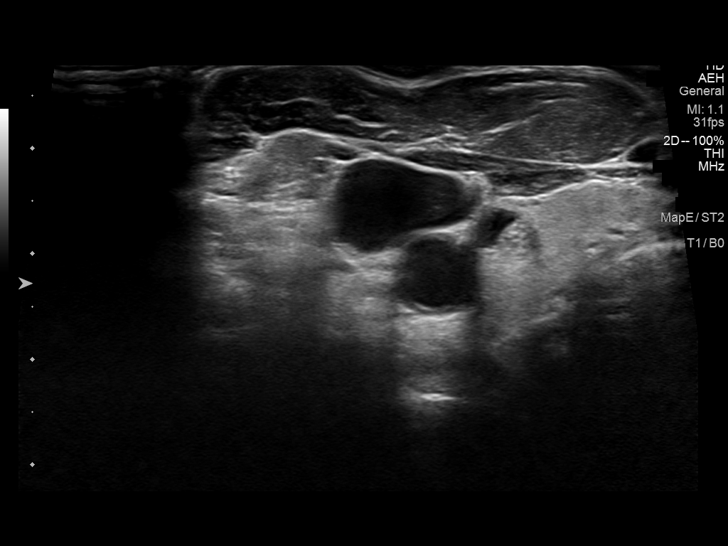
[im 23/55]
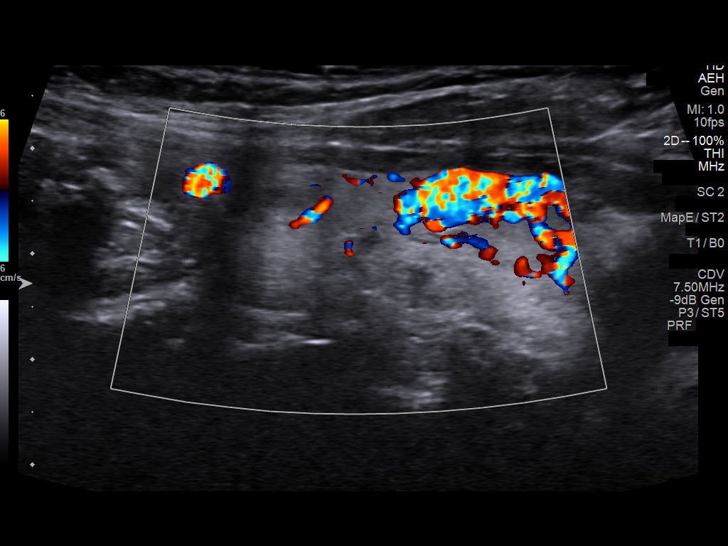
[im 28/55]
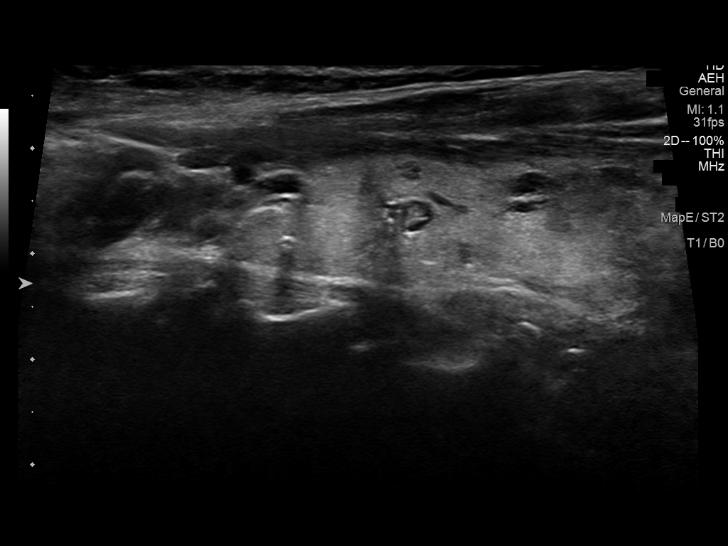
[im 32/55]
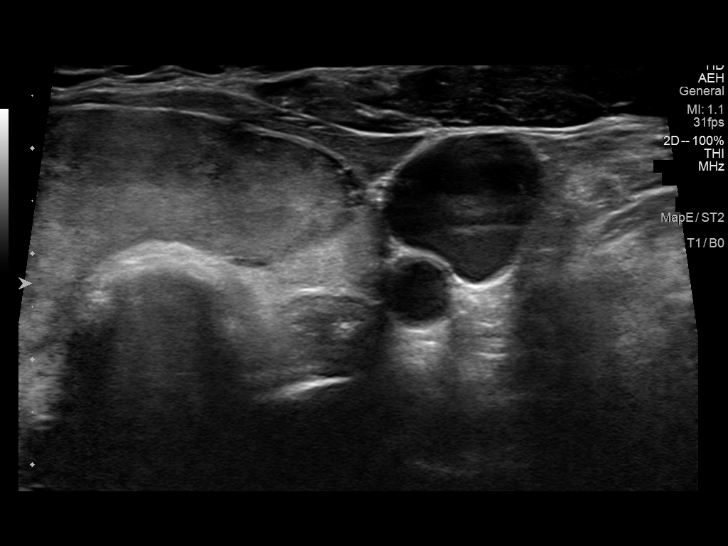
[im 37/55]
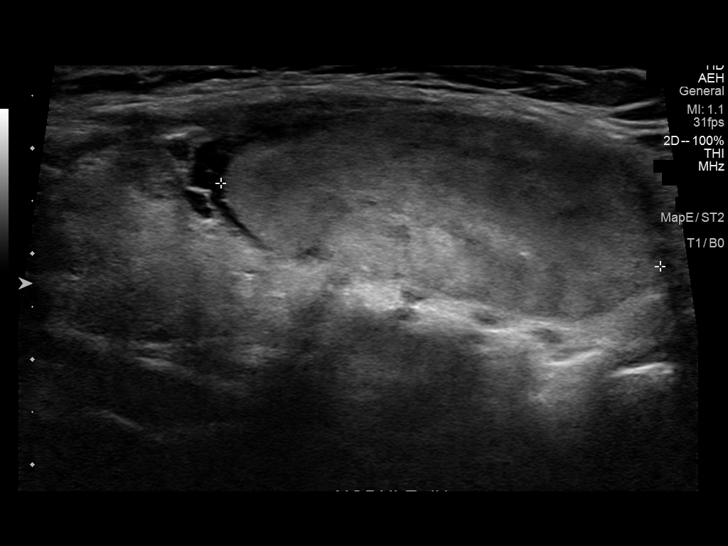
[im 41/55]
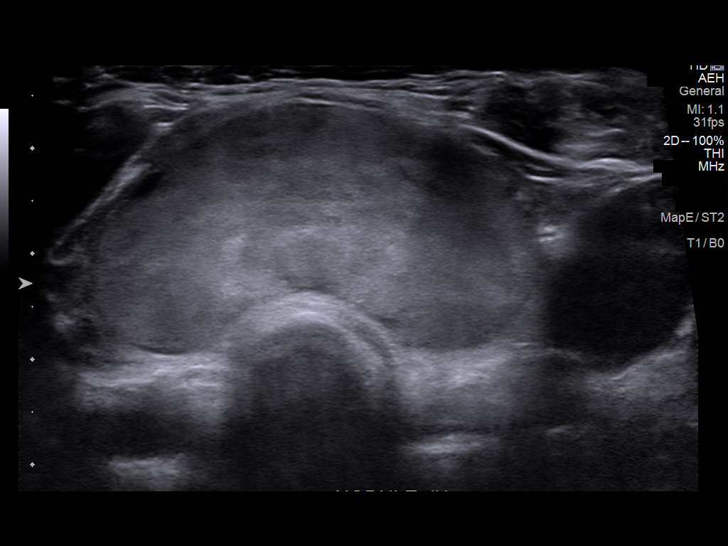
[im 46/55]
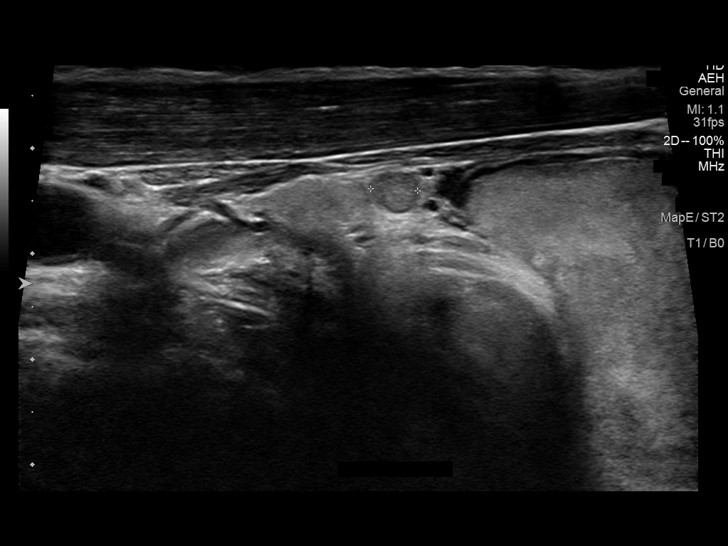
[im 50/55]
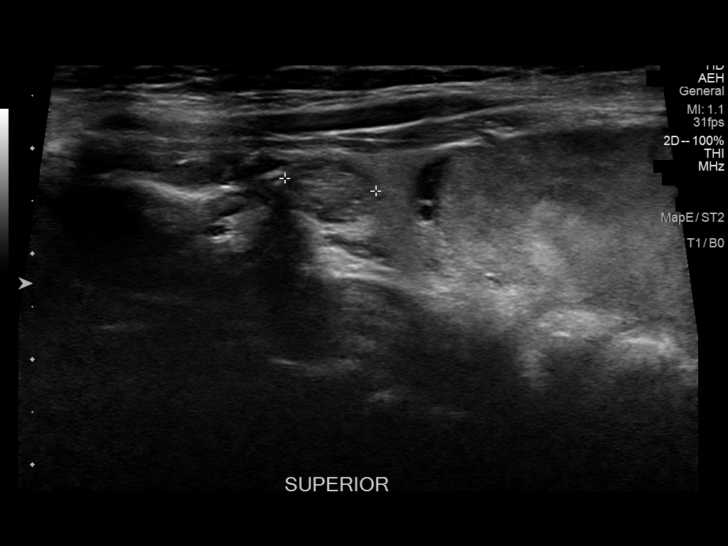
[im 55/55]
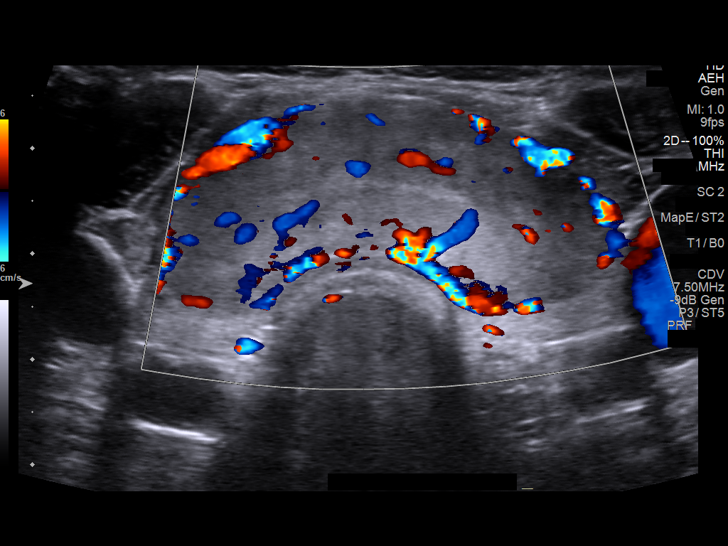

[13 of 25 positions shown; findings below may reference images not displayed]

FINDINGS: Parenchymal Echotexture: Mildly heterogenous

Isthmus: Normal in size measures 0.5 cm in diameter

Right lobe: Normal in size measuring 4.1 x 1.3 x 1.9 cm

Left lobe: Normal in size measuring 3.9 x 1.3 x 1.4 cm

_________________________________________________________

Estimated total number of nodules >/= 1 cm: 1

Number of spongiform nodules >/=  2 cm not described below (TR1): 0

Number of mixed cystic and solid nodules >/= 1.5 cm not described
below (TR2): 0

_________________________________________________________

Nodule # 1:

Location: Isthmus; Inferior

Maximum size: 4.2 cm; Other 2 dimensions: 4.0 x 2.0 cm

Composition: solid/almost completely solid (2)

Echogenicity: isoechoic (1)

Shape: not taller-than-wide (0)

Margins: smooth (0)

Echogenic foci: none (0)

ACR TI-RADS total points: 3.

ACR TI-RADS risk category: TR3 (3 points).

ACR TI-RADS recommendations:

**Given size (>/= 2.5 cm) and appearance, fine needle aspiration of
this mildly suspicious nodule should be considered based on TI-RADS
criteria.

_________________________________________________________

There is an approximately 0.6 cm partially cystic, partially solid
isoechoic nodule in the superior pole the right lobe of the thyroid
(labeled 2) which does not meet criteria to recommend percutaneous
sampling or continued dedicated follow-up.

There is an approximately 0.6 cm hypoechoic nodule within the mid,
medial aspect the right lobe of the thyroid (labeled 3), which does
not meet criteria to recommend percutaneous sampling or continued
dedicated follow-up.

_________________________________________________________

There is an approximately 0.9 x 0.9 x 0.7 cm hypoechoic
spongiform/benign-appearing nodule within the superior pole the left
lobe of the thyroid (labeled 4), which does not meet criteria to
recommend percutaneous sampling or continued dedicated follow-up.
IMPRESSION: 1. Findings suggestive of multinodular goiter.
2. Nodule #1, likely correlating with the palpable area of concern,
meets imaging criteria to recommend percutaneous sampling as
indicated.
3. The additional subcentimeter nodules do not meet imaging criteria
to recommend percutaneous sampling or continued dedicated follow-up.

The above is in keeping with the ACR TI-RADS recommendations - [HOSPITAL] 3657;[DATE].

## 2022-02-08 ENCOUNTER — Ambulatory Visit: Payer: Medicaid Other | Admitting: Gastroenterology

## 2022-02-18 IMAGING — US US FNA BIOPSY THYROID 1ST LESION
1 series · 13 of 16 positions shown · non-contrast
Comparison: US 07/31/20

MEDICATIONS:
7 cc 1% lidocaine

COMPLICATIONS:
None immediate.

INDICATION: Inferior thyroid nodule

4.2 cm
EXAM:
ULTRASOUND GUIDED FINE NEEDLE ASPIRATION OF INDETERMINATE THYROID
NODULE
TECHNIQUE: Informed written consent was obtained from the patient after a
discussion of the risks, benefits and alternatives to treatment.
Questions regarding the procedure were encouraged and answered. A
timeout was performed prior to the initiation of the procedure.

[Series 1: us fna biopsy thyroid 1st lesion · 0.06mm/px · 16 acquisitions, 13 frames shown]
[im 1/16]
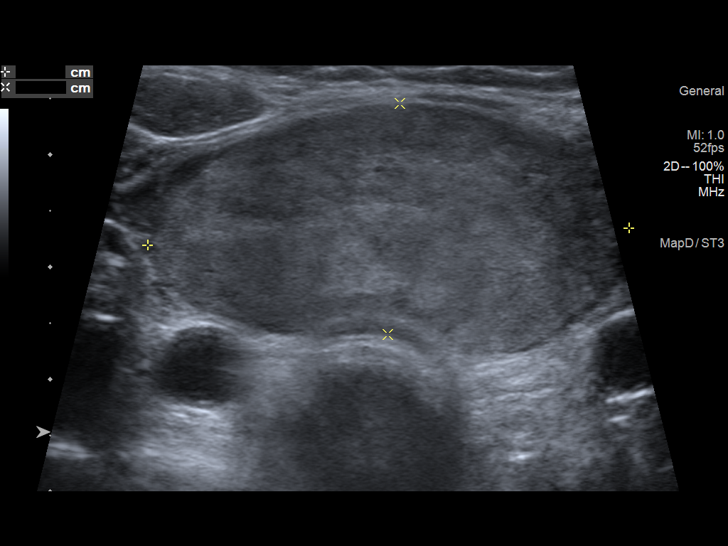
[im 2/16]
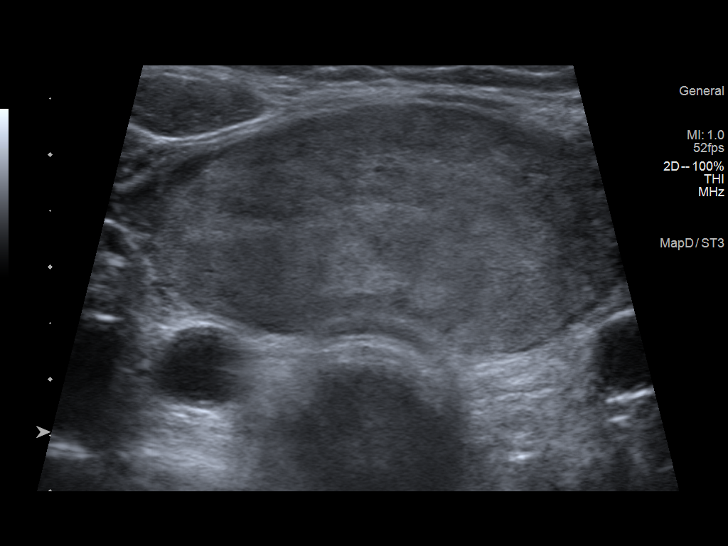
[im 4/16]
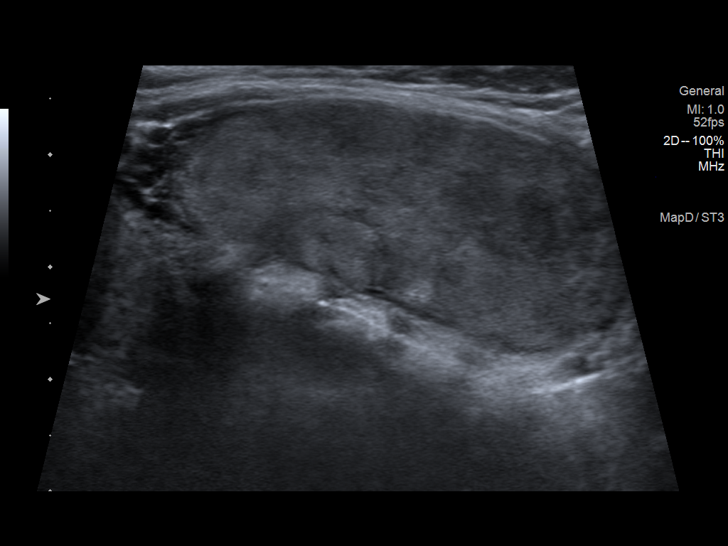
[im 5/16]
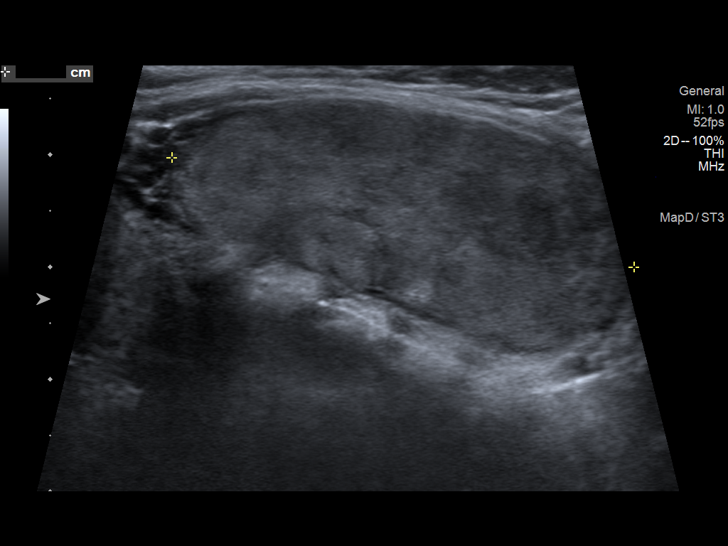
[im 6/16]
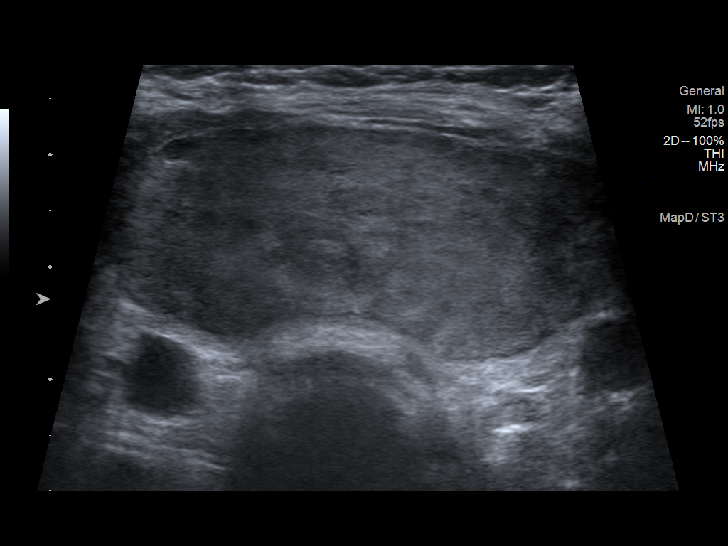
[im 7/16]
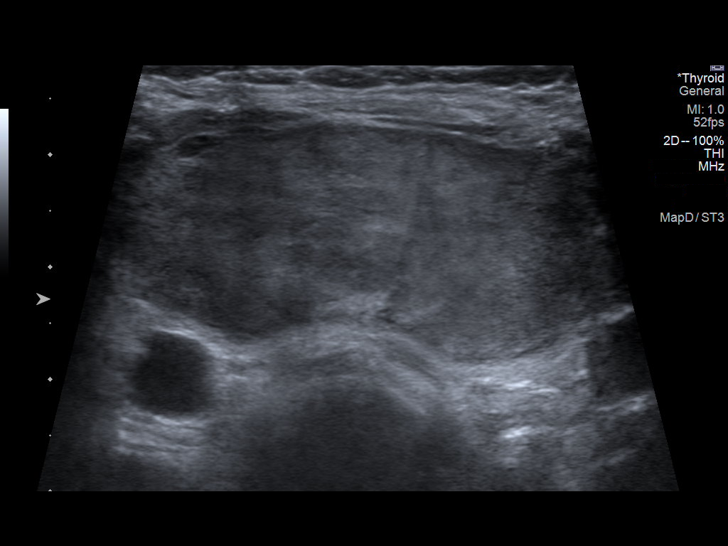
[im 9/16]
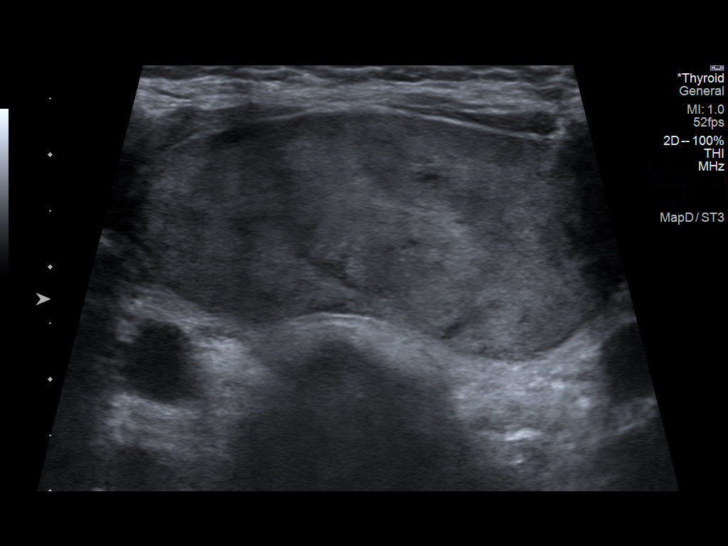
[im 10/16]
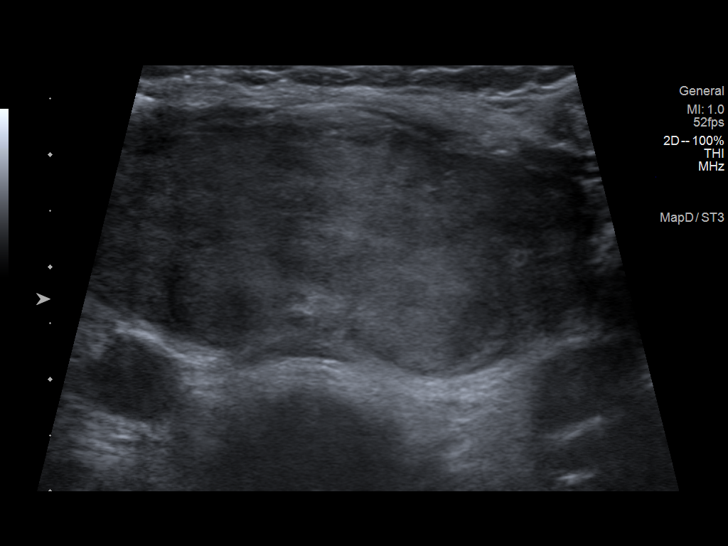
[im 11/16]
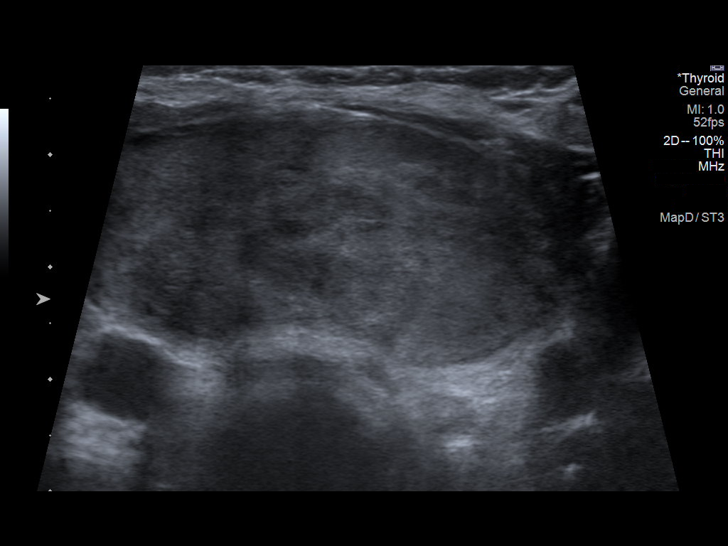
[im 12/16]
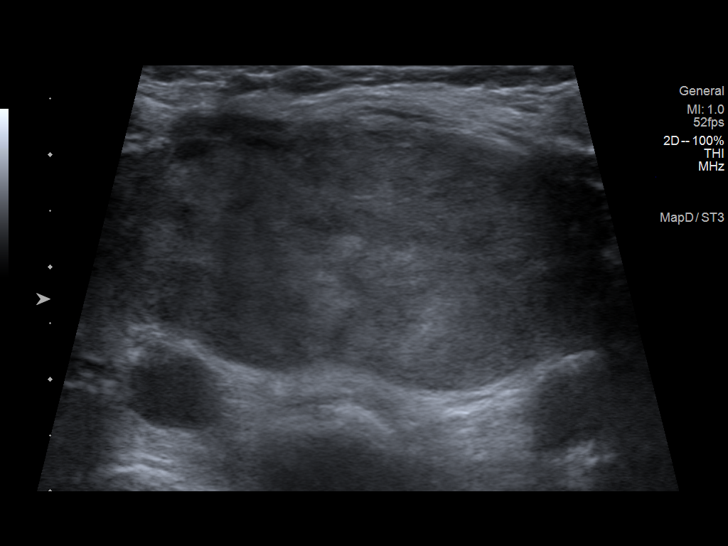
[im 13/16]
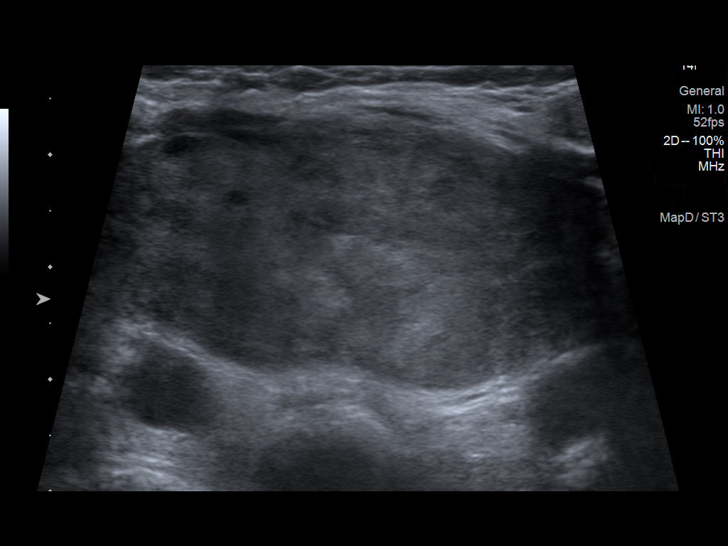
[im 15/16]
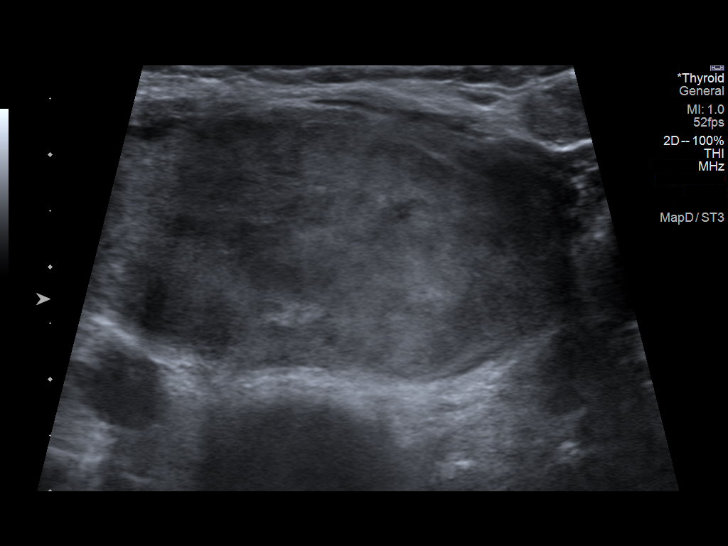
[im 16/16]
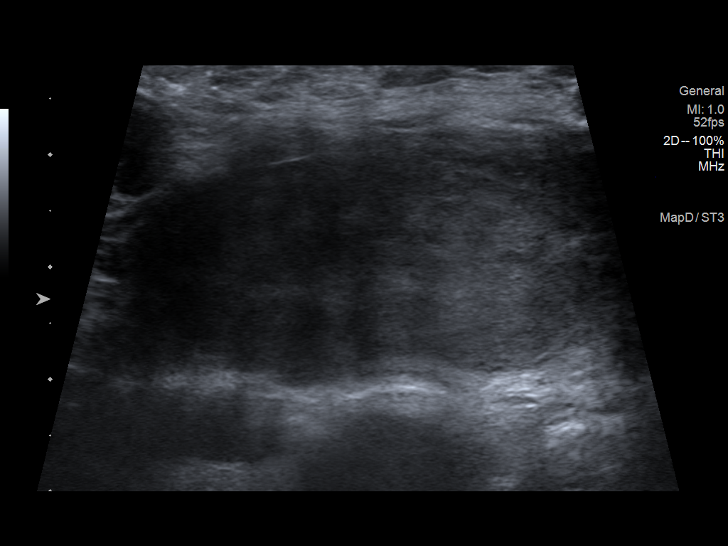

[13 of 16 positions shown; findings below may reference images not displayed]

Pre-procedural ultrasound scanning demonstrated unchanged size and
appearance of the indeterminate nodule within the Isthmus

The procedure was planned. The neck was prepped in the usual sterile
fashion, and a sterile drape was applied covering the operative
field. A timeout was performed prior to the initiation of the
procedure. Local anesthesia was provided with 1% lidocaine.

Under direct ultrasound guidance, 5 FNA biopsies were performed of
the inferior Isthmus nodule with a 25 gauge needle.

2 of these samples were obtained for AFIRMA.

Multiple ultrasound images were saved for procedural documentation
purposes. The samples were prepared and submitted to pathology.

Limited post procedural scanning was negative for hematoma or
additional complication. Dressings were placed. The patient
tolerated the above procedures procedure well without immediate
postprocedural complication.
FINDINGS: Nodule reference number based on prior diagnostic ultrasound: 1

Maximum size: 4.2 cm

Location: Isthmus; Inferior

ACR TI-RADS risk category: TR3 (3 points)

Reason for biopsy: meets ACR TI-RADS criteria

Ultrasound imaging confirms appropriate placement of the needles
within the thyroid nodule.
IMPRESSION: Technically successful ultrasound guided fine needle aspiration of
Inferior Isthmus nodule

Read by

Habtom Tun

## 2022-03-15 ENCOUNTER — Encounter: Payer: Self-pay | Admitting: Gastroenterology

## 2022-03-15 ENCOUNTER — Ambulatory Visit (INDEPENDENT_AMBULATORY_CARE_PROVIDER_SITE_OTHER): Payer: Medicare Other | Admitting: Gastroenterology

## 2022-03-15 VITALS — BP 108/69 | HR 79 | Temp 98.6°F | Ht 64.0 in | Wt 131.0 lb

## 2022-03-15 DIAGNOSIS — R1032 Left lower quadrant pain: Secondary | ICD-10-CM

## 2022-03-15 NOTE — Progress Notes (Signed)
Gastroenterology Office Note     Primary Care Physician:  Leeanne Rio, MD  Primary Gastroenterologist: Dr. Abbey Chatters    Chief Complaint   Chief Complaint  Patient presents with   Follow-up    Pt complains of left side and lower abd pain. Pain comes and goes for about 2 months. Back and forth constipation and diarrhea     History of Present Illness   Paula Carrillo is a 67 y.o. female presenting today in follow-up with a history of colon cancer many years ago s/p resection, intermittent loose stool worsening since Nov 2022 when had cholecystectomy, and chronic GERD.   Recent colonoscopy June 2023: non-bleeding internal hemorrhoids, random colonic biopsies. Benign colonic mucosa.    Has had LLQ discomfort and lower abdomen for past month. No fever or chills. Intermittent. Sometimes slightly improved with BM, sometimes not. Taking Colestid once daily. Feels this is constipating her moreso at times. Diarrhea has improved some.   Pantoprazole once daily. No dysphagia.        Past Medical History:  Diagnosis Date   Arthritis    Asthma    Cancer ([place])    Chronic abdominal pain    Chronic back pain    Chronic neck pain    Chronic pain    Colon cancer ([facility])    COPD (chronic obstructive pulmonary disease) ([facility])    Hemorrhoids    IBS (irritable bowel syndrome)    Migraines    Thyroid cancer ([place])    Thyroid disease     Past Surgical History:  Procedure Laterality Date   ABDOMINAL HYSTERECTOMY     APPENDECTOMY     BIOPSY  09/06/2021   Procedure: BIOPSY;  Surgeon: Eloise Harman, DO;  Location: [facility];  Service: Endoscopy;;   BREAST LUMPECTOMY     pt denies this   CHOLECYSTECTOMY N/A 01/18/2021   Procedure: LAPAROSCOPIC CHOLECYSTECTOMY WITH INTRAOPERATIVE CHOLANGIOGRAM;  Surgeon: Jovita Kussmaul, MD;  Location: [facility];  Service: General;  Laterality: N/A;   COLON RESECTION     COLONOSCOPY  07/17/2009   NFA:OZHYQ internal  hemorrhoids/tortuous colon/3-mm sessile hepatic polyp/sigmoid colon diverticula, small internal hemorrhoids, path: tubular adenoma   COLONOSCOPY N/A 07/23/2012   [facility]:QIONGE polyp in the ascending colon/Two sessile polyps in the rectum/RECTAL BLEEDING DUE TO Large internal hemorrhoids-S/P BANDINGx3   COLONOSCOPY WITH PROPOFOL N/A 09/06/2021   non-bleeding internal hemorrhoids, random colonic biopsies. Benign colonic mucosa.   THYROIDECTOMY N/A 12/16/2020   Procedure: TOTAL THYROIDECTOMY;  Surgeon: Ebbie Latus A, DO;  Location: [facility];  Service: ENT;  Laterality: N/A;    Current Outpatient Medications  Medication Sig Dispense Refill   acetaminophen (TYLENOL) 650 MG CR tablet Take 650-1,300 mg by mouth every 8 (eight) hours as needed for pain.     albuterol (PROVENTIL HFA;VENTOLIN HFA) 108 (90 BASE) MCG/ACT inhaler Inhale 2 puffs into the lungs every 6 (six) hours as needed for shortness of breath.     albuterol (PROVENTIL) (2.5 MG/3ML) 0.083% nebulizer solution Take 3 mLs (2.5 mg total) by nebulization every 4 (four) hours as needed for wheezing or shortness of breath. 75 mL 1   Budeson-Glycopyrrol-Formoterol (BREZTRI AEROSPHERE) 160-9-4.8 MCG/ACT AERO Inhale into the lungs.     cholecalciferol (VITAMIN D3) 25 MCG (1000 UNIT) tablet Take 1,000 Units by mouth daily.     famotidine (PEPCID) 20 MG tablet One after supper (Patient taking differently: Take 20 mg by mouth every evening. One after supper)  30 tablet 11   fluticasone (FLONASE) 50 MCG/ACT nasal spray Place 1 spray into both nostrils daily as needed for allergies or rhinitis.     levothyroxine (SYNTHROID) 112 MCG tablet Take 112 mcg by mouth daily before breakfast.     Nitroglycerin 0.4 % OINT Place 1 application rectally 2 (two) times daily. Per rectum. Wear gloves while applying and wash hands thereafter. 30 g 0   pantoprazole (PROTONIX) 40 MG tablet Take 1 tablet (40 mg total) by mouth daily. 30 minutes before breakfast 90 tablet 3    vitamin B-12 (CYANOCOBALAMIN) 500 MCG tablet Take 500 mcg by mouth daily.     vitamin C (ASCORBIC ACID) 500 MG tablet Take 500 mg by mouth daily.     colestipol (COLESTID) 1 g tablet Take 1 g by mouth daily. (Patient not taking: Reported on 03/15/2022)     diphenhydrAMINE (BENADRYL) 25 MG tablet Take 25 mg by mouth daily as needed for allergies. (Patient not taking: Reported on 03/15/2022)     Sod Picosulfate-Mag Ox-Cit Acd (CLENPIQ) 10-3.5-12 MG-GM -GM/175ML SOLN Take 1 kit by mouth as directed. (Patient not taking: Reported on 03/15/2022) 350 mL 0   No current facility-administered medications for this visit.    Allergies as of 03/15/2022 - Review Complete 03/15/2022  Allergen Reaction Noted   Sulfa antibiotics Hives and Itching 07/11/2012    Family History  Problem Relation Age of Onset   Colon cancer Neg Hx    Hypertension Mother    CVA Mother    Parkinson's disease Mother    Emphysema Father     Social History   Socioeconomic History   Marital status: Legally Separated    Spouse name: Not on file   Number of children: Not on file   Years of education: Not on file   Highest education level: Not on file  Occupational History   Not on file  Tobacco Use   Smoking status: Every Day    Packs/day: 1.00    Types: Cigarettes   Smokeless tobacco: Former    Quit date: 05/09/1974   Tobacco comments:    smokes 1 pack per day 07/29/20  Vaping Use   Vaping Use: Never used  Substance and Sexual Activity   Alcohol use: No    Alcohol/week: 0.0 standard drinks of alcohol   Drug use: No   Sexual activity: Not Currently  Other Topics Concern   Not on file  Social History Narrative   Not on file   Social Determinants of Health   Financial Resource Strain: Not on file  Food Insecurity: Not on file  Transportation Needs: Not on file  Physical Activity: Not on file  Stress: Not on file  Social Connections: Not on file  Intimate Partner Violence: Not on file     Review of Systems    Gen: Denies any fever, chills, fatigue, weight loss, lack of appetite.  CV: Denies chest pain, heart palpitations, peripheral edema, syncope.  Resp: Denies shortness of breath at rest or with exertion. Denies wheezing or cough.  GI: see HPI GU : Denies urinary burning, urinary frequency, urinary hesitancy MS: Denies joint pain, muscle weakness, cramps, or limitation of movement.  Derm: Denies rash, itching, dry skin Psych: Denies depression, anxiety, memory loss, and confusion Heme: Denies bruising, bleeding, and enlarged lymph nodes.   Physical Exam   BP 108/69   Pulse 79   Temp 98.6 F (37 C)   Ht _0  (1.626 m)   Wt 131 lb (  59.4 kg)   BMI 22.49 kg/m  General:   Alert and oriented. Pleasant and cooperative. Well-nourished and well-developed.  Head:  Normocephalic and atraumatic. Eyes:  Without icterus Abdomen:  +BS, soft, TTP LLQ and suprapubic and non-distended. No HSM noted. No guarding or rebound. No masses appreciated.  Rectal:  Deferred  Msk:  Symmetrical without gross deformities. Normal posture. Extremities:  Without edema. Neurologic:  Alert and  oriented x4;  grossly normal neurologically. Skin:  Intact without significant lesions or rashes. Psych:  Alert and cooperative. Normal mood and affect.   Assessment   Paula Carrillo is a 67 y.o. female presenting today in follow-up with a history of colon cancer many years ago s/p resection, intermittent loose stool worsening since Nov 2022 when had cholecystectomy, and chronic GERD.    Loose stool: felt most consistent with IBS +/- bile salt diarrhea. Colonoscopy on file from June 2023 with negative colonic biopsies. Colestid actually causing constipation. Will stop this and have patient call with update in 1 week. May do better with prn anti-spasmodic.  LLQ abdominal pain/lower abdomen: for past 2 months. Query if this is now due to moreso constipation in setting of Colestid. However, she is tender on exam. Will  arrange CT.   GERD: continue pantoprazole daily.     PLAN    Stop Colestid. Call with update. Consider dicyclomine or hyoscamine CT abd/pelvis with contrast in near future 3 month follow-up regardless   Annitta Needs, PhD, ANP-BC [facility]enter[facility]

## 2022-03-15 NOTE — Patient Instructions (Signed)
Let's stop Colestid. You might have return of looser stool. If this happens, please let me know.  We are arranging a CT scan in the near future!  We will see you in 3 months regardless!  I enjoyed seeing you again today! As you know, I value our relationship and want to provide genuine, compassionate, and quality care. I welcome your feedback. If you receive a survey regarding your visit,  I greatly appreciate you taking time to fill this out. See you next time!  Annitta Needs, PhD, ANP-BC [facility] Gastroenter[facility]

## 2022-03-16 ENCOUNTER — Encounter: Payer: Self-pay | Admitting: *Deleted

## 2022-03-16 ENCOUNTER — Telehealth: Payer: Self-pay | Admitting: *Deleted

## 2022-03-16 NOTE — Telephone Encounter (Signed)
Pt is scheduled for CT abd/pelvis with contrast on 04/26/22, arrive at 10:45 am to start drinking oral contrast. Appointment details mailed to pt.

## 2022-04-26 ENCOUNTER — Encounter (HOSPITAL_COMMUNITY): Payer: Self-pay | Admitting: Radiology

## 2022-04-26 ENCOUNTER — Ambulatory Visit (HOSPITAL_COMMUNITY)
Admission: RE | Admit: 2022-04-26 | Discharge: 2022-04-26 | Disposition: A | Discharge status: Home / Self Care | Payer: 59 | Source: Ambulatory Visit | Attending: Gastroenterology | Admitting: Gastroenterology

## 2022-04-26 DIAGNOSIS — K760 Fatty (change of) liver, not elsewhere classified: Secondary | ICD-10-CM | POA: Diagnosis not present

## 2022-04-26 DIAGNOSIS — R1032 Left lower quadrant pain: Secondary | ICD-10-CM | POA: Diagnosis not present

## 2022-04-26 DIAGNOSIS — N281 Cyst of kidney, acquired: Secondary | ICD-10-CM | POA: Diagnosis not present

## 2022-04-26 MED ORDER — IOHEXOL 300 MG/ML  SOLN
100.0000 mL | Freq: Once | INTRAMUSCULAR | Status: AC | PRN
  Administered 2022-04-26: 100 mL via INTRAVENOUS

## 2022-05-19 ENCOUNTER — Emergency Department (HOSPITAL_COMMUNITY): Payer: 59

## 2022-05-19 ENCOUNTER — Emergency Department (HOSPITAL_COMMUNITY)
Admission: EM | Admit: 2022-05-19 | Discharge: 2022-05-19 | Disposition: A | Discharge status: Home / Self Care | Payer: 59 | Attending: Emergency Medicine | Admitting: Emergency Medicine

## 2022-05-19 ENCOUNTER — Other Ambulatory Visit: Payer: Self-pay

## 2022-05-19 ENCOUNTER — Encounter (HOSPITAL_COMMUNITY): Payer: Self-pay | Admitting: Emergency Medicine

## 2022-05-19 DIAGNOSIS — R519 Headache, unspecified: Secondary | ICD-10-CM | POA: Diagnosis present

## 2022-05-19 DIAGNOSIS — Z1152 Encounter for screening for COVID-19: Secondary | ICD-10-CM | POA: Diagnosis not present

## 2022-05-19 DIAGNOSIS — Z85038 Personal history of other malignant neoplasm of large intestine: Secondary | ICD-10-CM | POA: Diagnosis not present

## 2022-05-19 DIAGNOSIS — J0120 Acute ethmoidal sinusitis, unspecified: Secondary | ICD-10-CM | POA: Diagnosis not present

## 2022-05-19 DIAGNOSIS — R079 Chest pain, unspecified: Secondary | ICD-10-CM | POA: Diagnosis not present

## 2022-05-19 DIAGNOSIS — R42 Dizziness and giddiness: Secondary | ICD-10-CM | POA: Diagnosis not present

## 2022-05-19 DIAGNOSIS — J449 Chronic obstructive pulmonary disease, unspecified: Secondary | ICD-10-CM | POA: Diagnosis not present

## 2022-05-19 DIAGNOSIS — R0789 Other chest pain: Secondary | ICD-10-CM | POA: Diagnosis not present

## 2022-05-19 DIAGNOSIS — R062 Wheezing: Secondary | ICD-10-CM | POA: Diagnosis not present

## 2022-05-19 DIAGNOSIS — R1032 Left lower quadrant pain: Secondary | ICD-10-CM | POA: Insufficient documentation

## 2022-05-19 DIAGNOSIS — Z7951 Long term (current) use of inhaled steroids: Secondary | ICD-10-CM | POA: Insufficient documentation

## 2022-05-19 DIAGNOSIS — K3189 Other diseases of stomach and duodenum: Secondary | ICD-10-CM | POA: Diagnosis not present

## 2022-05-19 LAB — CBC WITH DIFFERENTIAL/PLATELET
Abs Immature Granulocytes: 0.01 10*3/uL (ref 0.00–0.07)
Basophils Absolute: 0.1 10*3/uL (ref 0.0–0.1)
Basophils Relative: 2 %
Eosinophils Absolute: 0.5 10*3/uL (ref 0.0–0.5)
Eosinophils Relative: 7 %
HCT: 43.2 % (ref 36.0–46.0)
Hemoglobin: 14.1 g/dL (ref 12.0–15.0)
Immature Granulocytes: 0 %
Lymphocytes Relative: 40 %
Lymphs Abs: 3 10*3/uL (ref 0.7–4.0)
MCH: 32 pg (ref 26.0–34.0)
MCHC: 32.6 g/dL (ref 30.0–36.0)
MCV: 98 fL (ref 80.0–100.0)
Monocytes Absolute: 0.7 10*3/uL (ref 0.1–1.0)
Monocytes Relative: 9 %
Neutro Abs: 3.2 10*3/uL (ref 1.7–7.7)
Neutrophils Relative %: 42 %
Platelets: 332 10*3/uL (ref 150–400)
RBC: 4.41 MIL/uL (ref 3.87–5.11)
RDW: 12.8 % (ref 11.5–15.5)
WBC: 7.6 10*3/uL (ref 4.0–10.5)
nRBC: 0 % (ref 0.0–0.2)

## 2022-05-19 LAB — COMPREHENSIVE METABOLIC PANEL
ALT: 16 U/L (ref 0–44)
AST: 24 U/L (ref 15–41)
Albumin: 4 g/dL (ref 3.5–5.0)
Alkaline Phosphatase: 50 U/L (ref 38–126)
Anion gap: 9 (ref 5–15)
BUN: 8 mg/dL (ref 8–23)
CO2: 28 mmol/L (ref 22–32)
Calcium: 8.7 mg/dL — ABNORMAL LOW (ref 8.9–10.3)
Chloride: 102 mmol/L (ref 98–111)
Creatinine, Ser: 0.63 mg/dL (ref 0.44–1.00)
GFR, Estimated: >60 mL/min (ref >60)
Glucose, Bld: 75 mg/dL (ref 70–99)
Potassium: 4 mmol/L (ref 3.5–5.1)
Sodium: 139 mmol/L (ref 135–145)
Total Bilirubin: 1.1 mg/dL (ref 0.3–1.2)
Total Protein: 7.2 g/dL (ref 6.5–8.1)

## 2022-05-19 LAB — URINALYSIS, ROUTINE W REFLEX MICROSCOPIC
Bilirubin Urine: NEGATIVE
Glucose, UA: NEGATIVE mg/dL
Hgb urine dipstick: NEGATIVE
Ketones, ur: NEGATIVE mg/dL
Leukocytes,Ua: NEGATIVE
Nitrite: NEGATIVE
Protein, ur: NEGATIVE mg/dL
Specific Gravity, Urine: <1.005 — ABNORMAL LOW (ref 1.005–1.030)
pH: 7 (ref 5.0–8.0)

## 2022-05-19 LAB — TROPONIN I (HIGH SENSITIVITY)
Troponin I (High Sensitivity): 2 ng/L (ref <18)
Troponin I (High Sensitivity): 3 ng/L (ref <18)

## 2022-05-19 LAB — RESP PANEL BY RT-PCR (RSV, FLU A&B, COVID)  RVPGX2
Influenza A by PCR: NEGATIVE
Influenza B by PCR: NEGATIVE
Resp Syncytial Virus by PCR: NEGATIVE
SARS Coronavirus 2 by RT PCR: NEGATIVE

## 2022-05-19 MED ORDER — AMOXICILLIN-POT CLAVULANATE 875-125 MG PO TABS: 1.0000 | ORAL_TABLET | Freq: Two times a day (BID) | ORAL | 0 refills | Status: DC

## 2022-05-19 NOTE — ED Provider Notes (Signed)
[facility] Provider Note   CSN: [id number] Arrival date & time: 05/19/22  [id number]     History Chief Complaint  Patient presents with   Chest Pain   Facial Pain    Paula Carrillo is a 67 y.o. female.  Patient with past medical history significant for colon cancer, GERD, COPD presents to the emergency department with complaints of chest pain, facial pain, dizziness.  Patient reports the symptoms began approximately 6 weeks ago and have been a combination of feeling lightheaded and dizzy with some sinus pressure and bad taste in her mouth when she tries to eat or drink food at times.  She also reporting some chest pain and abdominal pain that she has been concerned about due to an abnormal CT scan done recently.  She reports that she has a history of COPD managed with inhaler medications at home.  Denies any known or obvious sick contacts.  Rates pain currently a 5 out of 10.  No prior cardiac history not currently take any antihypertensives or cholesterol medications.  Not currently on blood thinners.  Denies urinary symptoms at this time. Did report that she has had some nausea with an episode of vomiting in the past week.   Chest Pain      Home Medications Prior to Admission medications   Medication Sig Start Date End Date Taking? Authorizing Provider  acetaminophen (TYLENOL) 650 MG CR tablet Take 650-1,300 mg by mouth every 8 (eight) hours as needed for pain.   Yes [provider]  albuterol (PROVENTIL HFA;VENTOLIN HFA) 108 (90 BASE) MCG/ACT inhaler Inhale 2 puffs into the lungs every 6 (six) hours as needed for shortness of breath.   Yes [provider]  amoxicillin-clavulanate (AUGMENTIN) 875-125 MG tablet Take 1 tablet by mouth every 12 (twelve) hours. 05/19/22  Yes Luvenia Heller, PA-C  cholecalciferol (VITAMIN D3) 25 MCG (1000 UNIT) tablet Take 1,000 Units by mouth daily.   Yes [provider]  levothyroxine  (SYNTHROID) 112 MCG tablet Take 112 mcg by mouth daily before breakfast. 03/12/21  Yes [provider]  pantoprazole (PROTONIX) 40 MG tablet Take 1 tablet (40 mg total) by mouth daily. 30 minutes before breakfast 04/20/21  Yes Annitta Needs, NP  vitamin B-12 (CYANOCOBALAMIN) 500 MCG tablet Take 500 mcg by mouth daily.   Yes [provider]  vitamin C (ASCORBIC ACID) 500 MG tablet Take 500 mg by mouth daily.   Yes [provider]  albuterol (PROVENTIL) (2.5 MG/3ML) 0.083% nebulizer solution Take 3 mLs (2.5 mg total) by nebulization every 4 (four) hours as needed for wheezing or shortness of breath. Patient not taking: Reported on 05/19/2022 07/19/21   Tanda Rockers, MD  famotidine (PEPCID) 20 MG tablet One after supper Patient not taking: Reported on 05/19/2022 11/12/20   Tanda Rockers, MD  Nitroglycerin 0.4 % OINT Place 1 application rectally 2 (two) times daily. Per rectum. Wear gloves while applying and wash hands thereafter. Patient not taking: Reported on 05/19/2022 04/20/21   Annitta Needs, NP      Allergies    Sulfa antibiotics    Review of Systems   Review of Systems  HENT:  Positive for sinus pressure.   Cardiovascular:  Positive for chest pain.  All other systems reviewed and are negative.   Physical Exam Updated Vital Signs BP 130/83 (BP Location: Left Arm)   Pulse 71   Temp 98.2 F (36.8 C) (Oral)  Resp 16   Ht '5\' 4"'$  (1.626 m)   Wt 59 kg   SpO2 99%   BMI 22.33 kg/m  Physical Exam Vitals and nursing note reviewed.  Constitutional:      General: She is not in acute distress.    Appearance: She is well-developed. She is not ill-appearing.  HENT:     Head: Normocephalic and atraumatic.  Eyes:     Extraocular Movements: Extraocular movements intact.     Pupils: Pupils are equal, round, and reactive to light.  Cardiovascular:     Rate and Rhythm: Normal rate and regular rhythm.     Heart sounds: Normal heart sounds. Heart sounds not distant.      No friction rub.  Pulmonary:     Effort: Pulmonary effort is normal. No tachypnea.     Breath sounds: Examination of the right-upper field reveals wheezing. Examination of the left-upper field reveals wheezing. Examination of the right-middle field reveals wheezing. Examination of the left-middle field reveals wheezing. Examination of the right-lower field reveals wheezing. Examination of the left-lower field reveals wheezing. Wheezing present.  Abdominal:     General: Bowel sounds are normal.     Palpations: Abdomen is soft.     Tenderness: There is abdominal tenderness.     Comments: TTP along LLQ  Musculoskeletal:        General: Normal range of motion.     Cervical back: Normal range of motion.  Skin:    General: Skin is warm and dry.     Capillary Refill: Capillary refill takes less than 2 seconds.     Coloration: Skin is not pale.     Findings: No erythema or rash.  Neurological:     Mental Status: She is alert.     ED Results / Procedures / Treatments   Labs (all labs ordered are listed, but only abnormal results are displayed) Labs Reviewed  COMPREHENSIVE METABOLIC PANEL - Abnormal; Notable for the following components:      Result Value   Calcium 8.7 (*)    All other components within normal limits  URINALYSIS, ROUTINE W REFLEX MICROSCOPIC - Abnormal; Notable for the following components:   Specific Gravity, Urine <1.005 (*)    All other components within normal limits  RESP PANEL BY RT-PCR (RSV, FLU A&B, COVID)  [id number]  CBC WITH DIFFERENTIAL/PLATELET  TROPONIN I (HIGH SENSITIVITY)  TROPONIN I (HIGH SENSITIVITY)    EKG None  Radiology CT ABDOMEN PELVIS WO CONTRAST  Result Date: 05/19/2022 CLINICAL DATA:  Abdominal pain, acute, nonlocalized EXAM: CT ABDOMEN AND PELVIS WITHOUT CONTRAST TECHNIQUE: Multidetector CT imaging of the abdomen and pelvis was performed following the standard protocol without IV contrast. RADIATION DOSE REDUCTION: This exam was performed  according to the departmental dose-optimization program which includes automated exposure control, adjustment of the mA and/or kV according to patient size and/or use of iterative reconstruction technique. COMPARISON:  04/26/2002 and previous FINDINGS: Lower chest: No pleural or pericardial effusion. 4 mm subpleural nodule, lateral basal segment right lower lobe, stable since previous. Hepatobiliary: No focal liver abnormality is seen. Clips in the gallbladder fossa, the presence of at least 2 partially calcified gallstones in the region suggests partial cholecystectomy versus residual choledocholithiasis. No biliary dilatation. Pancreas: Unremarkable. No pancreatic ductal dilatation or surrounding inflammatory changes. Spleen: Normal in size without focal abnormality. Accessory splenule. Adrenals/Urinary Tract: Adrenal glands are unremarkable. Kidneys are normal, without renal calculi, focal lesion, or hydronephrosis. Bladder is unremarkable. Stomach/Bowel: Stomach is partially distended by gas  and fluid. The small bowel is decompressed. Appendix not identified. The colon is nondistended, with anastomotic staple line near the descending/sigmoid junction. Vascular/Lymphatic: Moderate scattered aortoiliac calcified atheromatous plaque without aneurysm. No abdominal or pelvic adenopathy. Reproductive: Uterus and bilateral adnexa are unremarkable. Other: Surgical clips in the left pelvis.  No ascites.  No free air. Musculoskeletal: Degenerative disc disease L2-3, stable. No acute findings. IMPRESSION: 1. Possible choledocholithiasis, without biliary ductal dilatation. 2. Stable 4 mm right lower lobe pulmonary nodule. 3.  Aortic Atherosclerosis (ICD10-I70.0). Electronically Signed   By: Lucrezia Europe M.D.   On: 05/19/2022 10:22   CT Head Wo Contrast  Result Date: 05/19/2022 CLINICAL DATA:  Lightheadedness and dizziness for the past 6 weeks. EXAM: CT HEAD WITHOUT CONTRAST TECHNIQUE: Contiguous axial images were obtained  from the base of the skull through the vertex without intravenous contrast. RADIATION DOSE REDUCTION: This exam was performed according to the departmental dose-optimization program which includes automated exposure control, adjustment of the mA and/or kV according to patient size and/or use of iterative reconstruction technique. COMPARISON:  CT head dated February 01, 2022. FINDINGS: Brain: No evidence of acute infarction, hemorrhage, hydrocephalus, extra-axial collection or mass lesion/mass effect. Stable mild chronic microvascular ischemic changes. Vascular: No hyperdense vessel or unexpected calcification. Skull: Normal. Negative for fracture or focal lesion. Sinuses/Orbits: No acute finding. Other: None. IMPRESSION: 1. No acute intracranial abnormality. Electronically Signed   By: Titus Dubin M.D.   On: 05/19/2022 10:07   DG Chest 2 View  Result Date: 05/19/2022 CLINICAL DATA:  Chest pain. EXAM: CHEST - 2 VIEW COMPARISON:  02/01/2022. FINDINGS: Clear lungs. Normal heart size and mediastinal contours. No pleural effusion or pneumothorax. Visualized bones and upper abdomen are unremarkable. IMPRESSION: No evidence of acute cardiopulmonary disease. Electronically Signed   By: Emmit Alexanders M.D.   On: 05/19/2022 10:02    Procedures Procedures   Medications Ordered in ED Medications - No data to display  ED Course/ Medical Decision Making/ A&P                           Medical Decision Making Amount and/or Complexity of Data Reviewed Labs: ordered. Radiology: ordered.   This patient presents to the ED for concern of chest pain, facial pain, abdominal pain. Differential diagnosis includes ACS, viral URI, sinusitis, allergies, bowel obstruction, gastroenteritis   Lab Tests:  I Ordered, and personally interpreted labs.  The pertinent results include:  Normal CBC   Imaging Studies ordered:  I ordered imaging studies including CT head, CT abdomen, chest xray I independently visualized  and interpreted imaging which showed no acute cardiopulmonary process, negative CT head, CT abdomen showing possible choledocholithiasis, but patient reports cholecystectomy about 2 years prior and no RUQ pain I agree with the radiologist interpretation   Problem List / ED Course:  Patient presents to the emergency department complaints of chest pain, facial pain, left lower quadrant abdominal pain.  She reports that the symptoms have been present for approximately 6 weeks off and on.  No prior significant history for any cardiac abnormalities.  Recently had CT abdomen performed which did not show any acute abdominal abnormalities.  Given presentation, thorough workup was performed to rule out any cardiac abnormalities which showed a normal EKG and negative troponin.  Other lab work was largely reassuring and normal without any acute signs of infection or electrolyte disturbances.  CT imaging was also thankfully reassuring without any acute intracranial abnormalities noted largely normal  CT abdomen.  There was no of possible choledocholithiasis but without any right upper quadrant pain, no leukocytosis, and patient reporting that she previously had her gallbladder removed approximately 2 years ago, I do not believe that this finding requires further workup at this time.  Discussed all these results with patient and she was reassured and feels comfortable discharging home.  Will treat her sinus drainage given it has been present for numerous weeks now as a sinus infection with antibiotics.  Patient was agreeable to treatment plan verbalized understanding all return precautions.  All questions answered prior to patient discharge.  Final Clinical Impression(s) / ED Diagnoses Final diagnoses:  Acute non-recurrent ethmoidal sinusitis    Rx / DC Orders ED Discharge Orders          Ordered    amoxicillin-clavulanate (AUGMENTIN) 875-125 MG tablet  Every 12 hours        05/19/22 1058               Vladimir Creeks 05/19/22 1105    Fransico Meadow, MD 05/19/22 1555

## 2022-05-19 NOTE — Discharge Instructions (Addendum)
You are seen in the emergency department for chest pain, facial pain, left lower quadrant abdominal pain.  Thankfully your workup was largely reassuring without any acute abnormality seen on the majority of your exams.  Your CT imaging was also reassuring.  Given the been experiencing nasal/sinus pain and discharge for a prolonged period of time now, I will treat you for bacterial sinusitis with antibiotics for the next 7 days.  Please make sure that you take his antibiotics in their entirety as prescribed.  If experience any significant chest pain, shortness of breath, diarrhea, please return to the emergency department for further evaluation.

## 2022-05-19 NOTE — ED Triage Notes (Signed)
Pt reports multiple symptoms bagining about 6 weeks ago, including lightheadedness, dizziness, sinus pressure, bad taste in mouth from sinus drainage, chest pain, coldness and abdominal pain on the lower left captured by CT on a previous visit.

## 2022-05-23 DIAGNOSIS — C73 Malignant neoplasm of thyroid gland: Secondary | ICD-10-CM | POA: Diagnosis not present

## 2022-05-23 DIAGNOSIS — E890 Postprocedural hypothyroidism: Secondary | ICD-10-CM | POA: Diagnosis not present

## 2022-05-31 IMAGING — RF DG CHOLANGIOGRAM OPERATIVE
1 series · 4 of 4 positions shown · non-contrast
Comparison: CT abdomen pelvis from 10/26/2020

CLINICAL DATA: 65-year-old female with history of cholelithiasis.

EXAM:
INTRAOPERATIVE CHOLANGIOGRAM
TECHNIQUE: Cholangiographic images from the C-arm fluoroscopic device were
submitted for interpretation post-operatively. Please see the
procedural report for the amount of contrast and the fluoroscopy
time utilized.

[Series 1: unknown protocol · 0.20mm/px · 4 of 123 frames shown]
[frame 19/123]
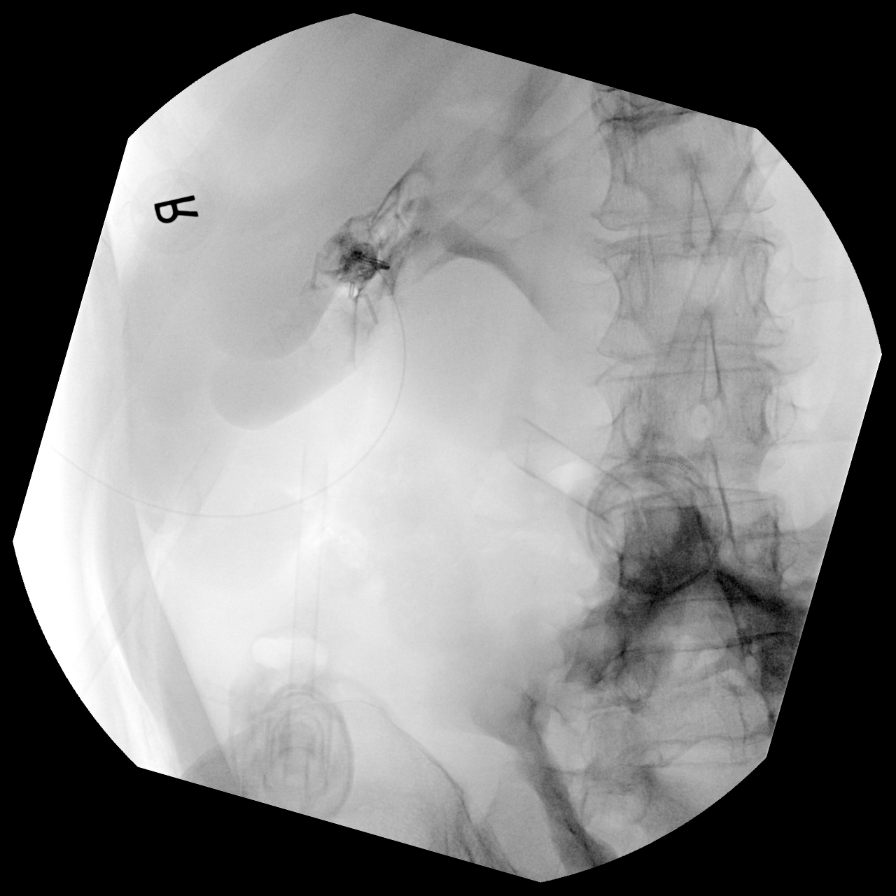
[frame 62/123]
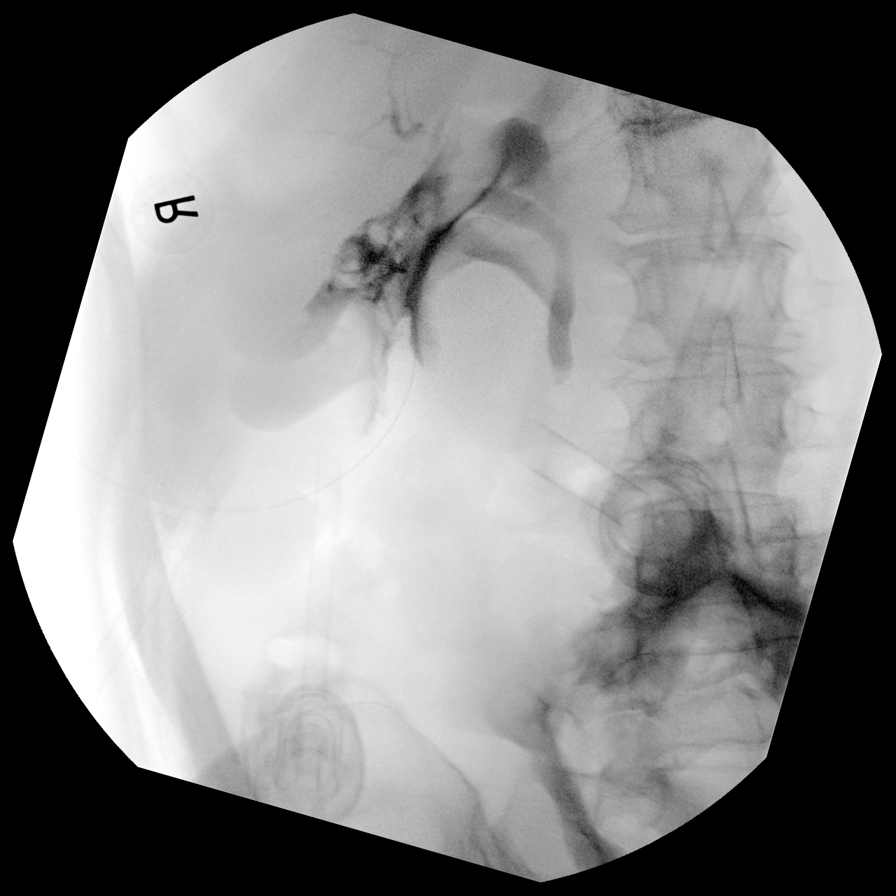
[frame 79/123]
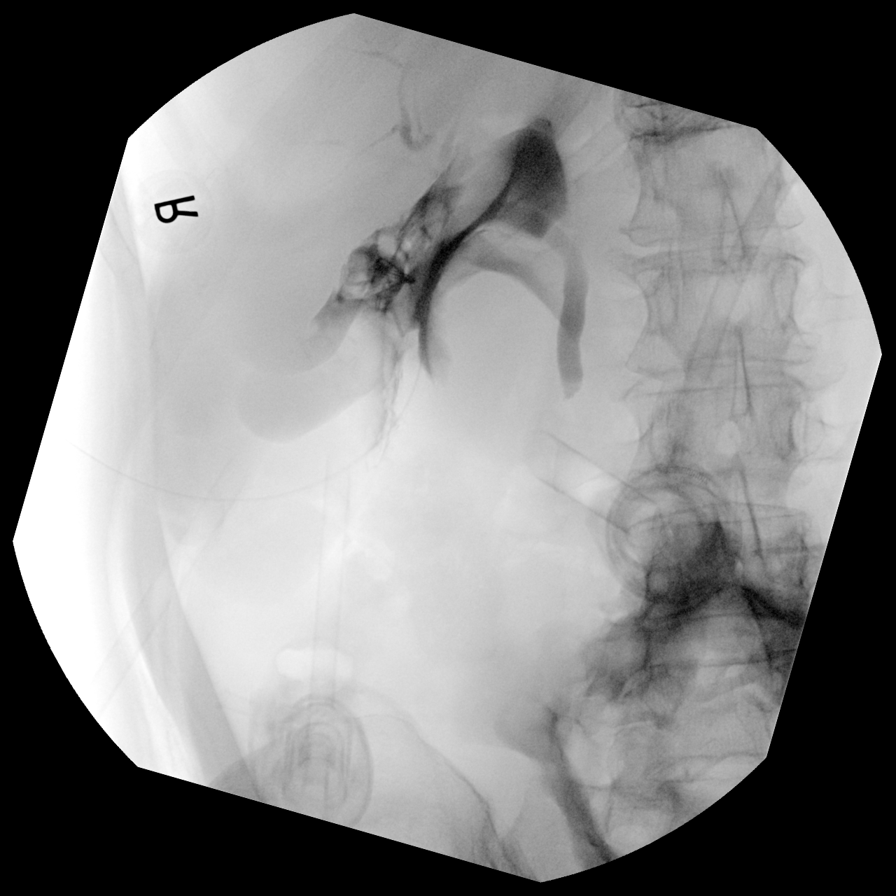
[frame 105/123]
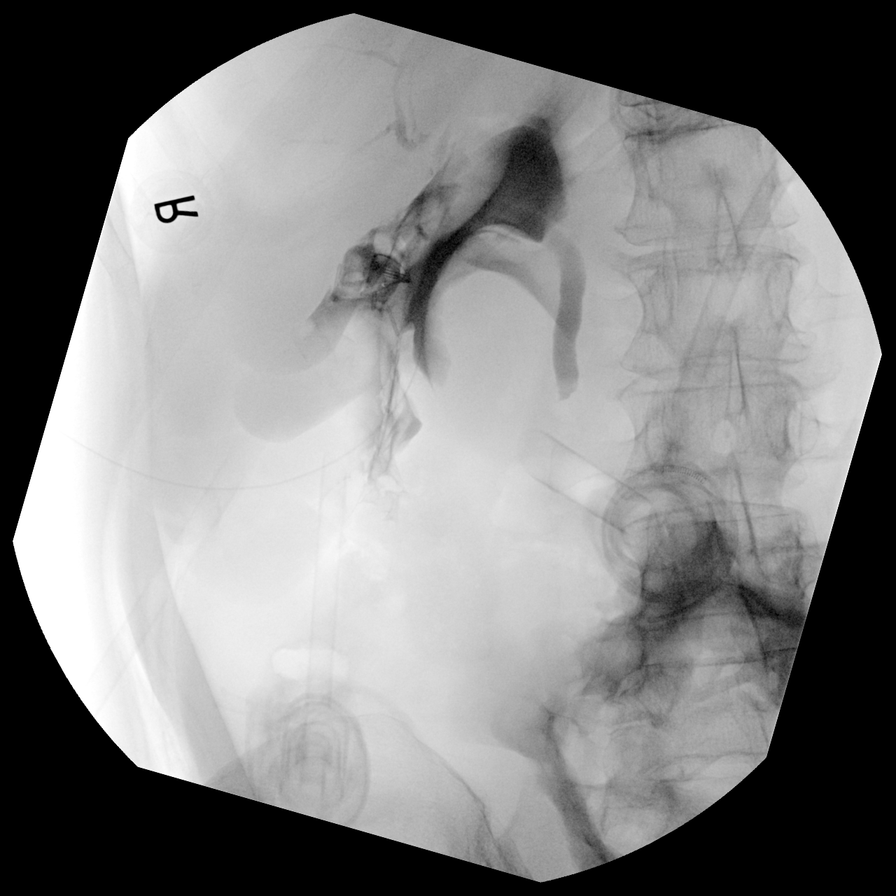

[4 of 4 positions shown; findings below may reference images not displayed]

FINDINGS: Intraoperative antegrade injection via the cystic duct which
opacifies the common bile duct and central portions of the
intrahepatic biliary tree. Contrast is not visualized flowing into
the duodenum, though no filling defects or discretely visualized.
There is spillage of contrast material about the subhepatic capsule
and gallbladder fossa. No significant intra or extrahepatic biliary
ductal dilation. No apparent anomalous anatomical configuration of
the biliary tree.
IMPRESSION: No definite evidence of choledocholithiasis, however contrast is not
visualized passing into the duodenum. This may be due to premature
termination of the study versus and obstructed distal common bile
duct. Consider ERCP as clinically indicated.

## 2022-06-10 DIAGNOSIS — B9689 Other specified bacterial agents as the cause of diseases classified elsewhere: Secondary | ICD-10-CM | POA: Diagnosis not present

## 2022-06-10 DIAGNOSIS — J329 Chronic sinusitis, unspecified: Secondary | ICD-10-CM | POA: Diagnosis not present

## 2022-06-10 DIAGNOSIS — J441 Chronic obstructive pulmonary disease with (acute) exacerbation: Secondary | ICD-10-CM | POA: Diagnosis not present

## 2022-06-21 ENCOUNTER — Ambulatory Visit: Payer: 59 | Admitting: Gastroenterology

## 2022-06-22 ENCOUNTER — Encounter: Payer: Self-pay | Admitting: Gastroenterology

## 2022-07-05 DIAGNOSIS — J329 Chronic sinusitis, unspecified: Secondary | ICD-10-CM | POA: Diagnosis not present

## 2022-07-05 DIAGNOSIS — R42 Dizziness and giddiness: Secondary | ICD-10-CM | POA: Diagnosis not present

## 2022-07-26 ENCOUNTER — Ambulatory Visit (INDEPENDENT_AMBULATORY_CARE_PROVIDER_SITE_OTHER): Payer: 59 | Admitting: Gastroenterology

## 2022-07-26 ENCOUNTER — Encounter: Payer: Self-pay | Admitting: Gastroenterology

## 2022-07-26 VITALS — BP 126/85 | HR 80 | Temp 97.3°F | Ht 64.0 in | Wt 132.4 lb

## 2022-07-26 DIAGNOSIS — K580 Irritable bowel syndrome with diarrhea: Secondary | ICD-10-CM | POA: Diagnosis not present

## 2022-07-26 DIAGNOSIS — R933 Abnormal findings on diagnostic imaging of other parts of digestive tract: Secondary | ICD-10-CM | POA: Diagnosis not present

## 2022-07-26 DIAGNOSIS — K589 Irritable bowel syndrome without diarrhea: Secondary | ICD-10-CM | POA: Insufficient documentation

## 2022-07-26 MED ORDER — RIFAXIMIN 550 MG PO TABS
550.0000 mg | ORAL_TABLET | Freq: Three times a day (TID) | ORAL | 0 refills | Status: AC
Start: 2022-07-26 — End: 2022-08-09

## 2022-07-26 NOTE — Patient Instructions (Signed)
I have ordered an ultrasound to evaluate your bile duct.  I have sent in Xifaxan to take 3 times a day just for 14 days. If this isn't covered by insurance, we will try to get samples!  Will see you in 6 months!  I enjoyed seeing you again today! I value our relationship and want to provide genuine, compassionate, and quality care. You may receive a survey regarding your visit with me, and I welcome your feedback! Thanks so much for taking the time to complete this. I look forward to seeing you again.      Gelene Mink, PhD, ANP-BC [facility]

## 2022-07-26 NOTE — Progress Notes (Signed)
Gastroenterology Office Note     Primary Care Physician:  Catalina Lunger, DO  Primary Gastroenterologist: Dr. Marletta Lor    Chief Complaint   Chief Complaint  Patient presents with   Follow-up    Follow up on GERD     History of Present Illness   Paula Carrillo is a 67 y.o. female presenting today in follow-up with a history of  colon cancer many years ago s/p resection, intermittent loose stool worsening since Nov 2022 when had cholecystectomy, and chronic GERD. Last seen in Jan 2024 with loose stool felt consistent with IBS +/- bile salt diarrhea.    CT abd/pelvis with contrast FEb 2024 due to LLQ abdominal pain was unrevealing. She had a CT without contrast March 2024 with questionable choledocholithiasis but no biliary dilation. LFTs normal.    About a week ago felt like someone was punching in her mid abdomen. Then resolved. LLQ resolved. Looser stool comes and goes. Got constipated on Colestid. She has stopped this. Will have occasional diarrhea.    States she was diagnosed with lung cancer and was told she needs radiation. I don't see this in Epic or Care Everywhere. Sees ENT in the future due to chronic sinusitis.    Colonoscopy June 2023: non-bleeding internal hemorrhoids, random colonic biopsies. Benign colonic mucosa.      Past Medical History:  Diagnosis Date   Arthritis    Asthma    Cancer (HCC)    Chronic abdominal pain    Chronic back pain    Chronic neck pain    Chronic pain    Colon cancer (HCC)    COPD (chronic obstructive pulmonary disease) (HCC)    Hemorrhoids    IBS (irritable bowel syndrome)    Migraines    Thyroid cancer (HCC)    Thyroid disease     Past Surgical History:  Procedure Laterality Date   ABDOMINAL HYSTERECTOMY     APPENDECTOMY     BIOPSY  09/06/2021   Procedure: BIOPSY;  Surgeon: Lanelle Bal, DO;  Location: [facility];  Service: Endoscopy;;   BREAST LUMPECTOMY     pt denies this   CHOLECYSTECTOMY N/A  01/18/2021   Procedure: LAPAROSCOPIC CHOLECYSTECTOMY WITH INTRAOPERATIVE CHOLANGIOGRAM;  Surgeon: Griselda Miner, MD;  Location: [facility] OR;  Service: General;  Laterality: N/A;   COLON RESECTION     COLONOSCOPY  07/17/2009   ZOX:WRUEA internal hemorrhoids/tortuous colon/3-mm sessile hepatic polyp/sigmoid colon diverticula, small internal hemorrhoids, path: tubular adenoma   COLONOSCOPY N/A 07/23/2012   VWU:[id number] polyp in the ascending colon/Two sessile polyps in the rectum/RECTAL BLEEDING DUE TO Large internal hemorrhoids-S/P BANDINGx3   COLONOSCOPY WITH PROPOFOL N/A 09/06/2021   non-bleeding internal hemorrhoids, random colonic biopsies. Benign colonic mucosa.   THYROIDECTOMY N/A 12/16/2020   Procedure: TOTAL THYROIDECTOMY;  Surgeon: Cheron Schaumann A, DO;  Location: [facility] OR;  Service: ENT;  Laterality: N/A;    Current Outpatient Medications  Medication Sig Dispense Refill   acetaminophen (TYLENOL) 650 MG CR tablet Take 650-1,300 mg by mouth every 8 (eight) hours as needed for pain.     albuterol (PROVENTIL HFA;VENTOLIN HFA) 108 (90 BASE) MCG/ACT inhaler Inhale 2 puffs into the lungs every 6 (six) hours as needed for shortness of breath.     albuterol (PROVENTIL) (2.5 MG/3ML) 0.083% nebulizer solution Take 3 mLs (2.5 mg total) by nebulization every 4 (four) hours as needed for wheezing or shortness of breath. 75 mL 1   cholecalciferol (VITAMIN D3) 25 MCG (1000 UNIT)  tablet Take 1,000 Units by mouth daily.     levothyroxine (SYNTHROID) 112 MCG tablet Take 112 mcg by mouth daily before breakfast.     pantoprazole (PROTONIX) 40 MG tablet Take 1 tablet (40 mg total) by mouth daily. 30 minutes before breakfast 90 tablet 3   vitamin B-12 (CYANOCOBALAMIN) 500 MCG tablet Take 500 mcg by mouth daily.     vitamin C (ASCORBIC ACID) 500 MG tablet Take 500 mg by mouth daily.     amoxicillin-clavulanate (AUGMENTIN) 875-125 MG tablet Take 1 tablet by mouth every 12 (twelve) hours. (Patient not taking: Reported  on 07/26/2022) 14 tablet 0   famotidine (PEPCID) 20 MG tablet One after supper (Patient not taking: Reported on 05/19/2022) 30 tablet 11   Nitroglycerin 0.4 % OINT Place 1 application rectally 2 (two) times daily. Per rectum. Wear gloves while applying and wash hands thereafter. (Patient not taking: Reported on 05/19/2022) 30 g 0   No current facility-administered medications for this visit.    Allergies as of 07/26/2022 - Review Complete 07/26/2022  Allergen Reaction Noted   Sulfa antibiotics Hives and Itching 07/11/2012    Family History  Problem Relation Age of Onset   Colon cancer Neg Hx    Hypertension Mother    CVA Mother    Parkinson's disease Mother    Emphysema Father     Social History   Socioeconomic History   Marital status: Legally Separated    Spouse name: Not on file   Number of children: Not on file   Years of education: Not on file   Highest education level: Not on file  Occupational History   Not on file  Tobacco Use   Smoking status: Every Day    Packs/day: 1    Types: Cigarettes   Smokeless tobacco: Former    Quit date: 05/09/1974   Tobacco comments:    smokes 1 pack per day 07/29/20  Vaping Use   Vaping Use: Never used  Substance and Sexual Activity   Alcohol use: No    Alcohol/week: 0.0 standard drinks of alcohol   Drug use: No   Sexual activity: Not Currently  Other Topics Concern   Not on file  Social History Narrative   Not on file   Social Determinants of Health   Financial Resource Strain: Not on file  Food Insecurity: Not on file  Transportation Needs: Not on file  Physical Activity: Not on file  Stress: Not on file  Social Connections: Not on file  Intimate Partner Violence: Not on file     Review of Systems   Gen: Denies any fever, chills, fatigue, weight loss, lack of appetite.  CV: Denies chest pain, heart palpitations, peripheral edema, syncope.  Resp: Denies shortness of breath at rest or with exertion. Denies wheezing or  cough.  GI: Denies dysphagia or odynophagia. Denies jaundice, hematemesis, fecal incontinence. GU : Denies urinary burning, urinary frequency, urinary hesitancy MS: Denies joint pain, muscle weakness, cramps, or limitation of movement.  Derm: Denies rash, itching, dry skin Psych: Denies depression, anxiety, memory loss, and confusion Heme: Denies bruising, bleeding, and enlarged lymph nodes.   Physical Exam   BP 126/85   Pulse 80   Temp (!) 97.3 F (36.3 C)   Ht 5\' 4"  (1.626 m)   Wt 132 lb 6.4 oz (60.1 kg)   BMI 22.73 kg/m  General:   Alert and oriented. Pleasant and cooperative. Well-nourished and well-developed.  Head:  Normocephalic and atraumatic. Eyes:  Without icterus  Abdomen:  +BS, soft, mild TTP lower abdomen and epigastric and non-distended. No HSM noted. No guarding or rebound. No masses appreciated.  Rectal:  Deferred  Msk:  Symmetrical without gross deformities. Normal posture. Extremities:  Without edema. Neurologic:  Alert and  oriented x4;  grossly normal neurologically. Skin:  Intact without significant lesions or rashes. Psych:  Alert and cooperative. Normal mood and affect.  Lab Results  Component Value Date   ALT 16 05/19/2022   AST 24 05/19/2022   ALKPHOS 50 05/19/2022   BILITOT 1.1 05/19/2022   Lab Results  Component Value Date   WBC 7.6 05/19/2022   HGB 14.1 05/19/2022   HCT 43.2 05/19/2022   MCV 98.0 05/19/2022   PLT 332 05/19/2022      Assessment   BHAVANA MCMANAMY is a 67 y.o. female presenting today in follow-up with a history of  colon cancer many years ago s/p resection, IBS-D +/- bile salt diarrhea, and chronic GERD.   Colon cancer history: colonoscopy on file from June 2023.   IBS-D/bile salt diarrhea: suspected. Negative random colonic biopsies last year. Colestid actually caused constipation. Will trial course of Xifaxan. Overall improved. If not covered, will provide samples.  GERD: pantoprazole daily.  Abnormal non-contrast  CT in March 2024 with questionable choledocholithiasis but normal LFTs. No obvious biliary-type pain. Gallbladder absent. Will update [place].      PLAN    RUQ [place] Course of Xifaxan TID for 14 days. Provide samples if not covered by insurance PPI daily 6 month return   Gelene Mink, PhD, [facility]

## 2022-07-27 ENCOUNTER — Telehealth: Payer: Self-pay

## 2022-07-27 ENCOUNTER — Telehealth: Payer: Self-pay | Admitting: *Deleted

## 2022-07-27 NOTE — Telephone Encounter (Signed)
error 

## 2022-07-27 NOTE — Telephone Encounter (Signed)
Pt approved for Xifaxan through 08-10-2022. Documentation given to Darl Pikes for scan to chart. Documentation sent to [facility] in [place]

## 2022-07-27 NOTE — Telephone Encounter (Signed)
PA done for Xifaxan 550mg  tab. Dx: K58.0 --IBS-D. Tried/failed: Colestid. Waiting on a response from Cover My Meds.

## 2022-07-27 NOTE — Telephone Encounter (Signed)
Called pt, no answer and not able to leave VM  Will need to give [place] appt details fpr 5/30, arrival 10am, npo midnight

## 2022-08-02 NOTE — Telephone Encounter (Signed)
Spoke with pt and she is aware of appt details.  

## 2022-08-11 ENCOUNTER — Ambulatory Visit (HOSPITAL_COMMUNITY)
Admission: RE | Admit: 2022-08-11 | Discharge: 2022-08-11 | Disposition: A | Discharge status: Home / Self Care | Payer: 59 | Source: Ambulatory Visit | Attending: Gastroenterology | Admitting: Gastroenterology

## 2022-08-11 DIAGNOSIS — R933 Abnormal findings on diagnostic imaging of other parts of digestive tract: Secondary | ICD-10-CM | POA: Diagnosis not present

## 2022-08-11 DIAGNOSIS — Z9049 Acquired absence of other specified parts of digestive tract: Secondary | ICD-10-CM | POA: Diagnosis not present

## 2022-08-15 DIAGNOSIS — M545 Low back pain, unspecified: Secondary | ICD-10-CM | POA: Diagnosis not present

## 2022-08-15 DIAGNOSIS — M9904 Segmental and somatic dysfunction of sacral region: Secondary | ICD-10-CM | POA: Diagnosis not present

## 2022-08-15 DIAGNOSIS — M9903 Segmental and somatic dysfunction of lumbar region: Secondary | ICD-10-CM | POA: Diagnosis not present

## 2022-08-15 DIAGNOSIS — M9905 Segmental and somatic dysfunction of pelvic region: Secondary | ICD-10-CM | POA: Diagnosis not present

## 2022-08-23 DIAGNOSIS — M546 Pain in thoracic spine: Secondary | ICD-10-CM | POA: Diagnosis not present

## 2022-08-23 DIAGNOSIS — M545 Low back pain, unspecified: Secondary | ICD-10-CM | POA: Diagnosis not present

## 2022-08-23 DIAGNOSIS — M9904 Segmental and somatic dysfunction of sacral region: Secondary | ICD-10-CM | POA: Diagnosis not present

## 2022-08-23 DIAGNOSIS — M9905 Segmental and somatic dysfunction of pelvic region: Secondary | ICD-10-CM | POA: Diagnosis not present

## 2022-08-29 DIAGNOSIS — M9904 Segmental and somatic dysfunction of sacral region: Secondary | ICD-10-CM | POA: Diagnosis not present

## 2022-08-29 DIAGNOSIS — M545 Low back pain, unspecified: Secondary | ICD-10-CM | POA: Diagnosis not present

## 2022-08-29 DIAGNOSIS — M9905 Segmental and somatic dysfunction of pelvic region: Secondary | ICD-10-CM | POA: Diagnosis not present

## 2022-08-29 DIAGNOSIS — M9903 Segmental and somatic dysfunction of lumbar region: Secondary | ICD-10-CM | POA: Diagnosis not present

## 2022-09-04 NOTE — Progress Notes (Unsigned)
Paula Carrillo, female    DOB: 02-16-1956     MRN: [id number]   Brief patient profile:  79 yowf Paula Carrillo/active smoker says dx with copd around 2012 and maint on advair and spiriva and referred to pulmonary clinic in [place]  07/29/2020 by Dr   Paula Carrillo with worsening sob/cough with globus on advair/spiriva dpi    07/13/20 ent eval: moderate interarytenoid edema and erythema consistent with laryngopharyngeal reflux. 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 Referred to [facility] voice center Thyroid nodule x 4 cm > thyroid US >  plans  thyroidectomy at [facility]     History of Present Illness  07/29/2020  Pulmonary/ 1st office eval/ Paula Carrillo / [facility] Office  Chief Complaint  Patient presents with   Consult    Productive cough with white phlegm, sore throat "something growing in it" since March 2022  Dyspnea:  [employer] shopping but has to go to slow stops each aisle = MMRC3 = can't walk 100 yards even at a slow pace at a flat grade s stopping due to sob   Cough: sense of throat congestion/ globus  on advair/ spiriva  Sleep: < 30 degrees /bed is flat with pillows  SABA use: very confused re how/ when to use it 02  None  rec The key is to stop smoking completely before smoking completely stops you! Plan A = Automatic = Always=    spiriva 2 puffs each am and stop powdered advair and spiriva  Prednisone 10 mg take  4 each am x 2 days,   2 each am x 2 days,  1 each am x 2 days and stop  Plan B = Backup (to supplement plan A, not to replace it) Only use your albuterol inhaler as a rescue medicatio Plan C = Crisis (instead of Plan B but only if Plan B stops working) - only use your albuterol nebulizer if you first try Plan B and it fails to help > ok to use the nebulizer up to every 4 hours but if start needing it regularly call for immediate appointment GERD  diet  Please schedule a follow up office visit in 2 weeks, call sooner if needed with  all medications /inhalers/ solutions in hand so we can verify exactly what you are taking. This includes all medications from all doctors and over the counters      12/16/20  [facility] thyroidectomy   08/31/2021  f/u ov/ office/Paula Carrillo re: GOLD 2 COPD  maint on breztri 2bid   Chief Complaint  Patient presents with   Follow-up    Cough is improving    Dyspnea:  [employer] shopping / cross parking lot s 02  Cough: better but still smoker worse in am  Sleeping: bed is flat/ raises head sev pillows SABA use: avg twice daily /  02: none  Covid status: none, never infected  Lung cancer screening: referred today  Rec The key is to stop smoking completely before smoking completely stops you! My office will be contacting you by phone for referral for lung cancer screening > not done as of 09/05/2022   Plan A = Automatic = Always=    breztri   Plan B = Backup (to supplement plan A, not to replace it) Only use your albuterol inhaler as a rescue medication  Plan C = Crisis (instead of Plan B but only if Plan B stops working) - only use your albuterol nebulizer if you first try  Plan B      09/05/2022  yearly f/u ov/[place] office/Paula Carrillo re: GOLD 2 COPD/ Paula Carrillo maint on breztri x 2 each am  only  Chief Complaint  Patient presents with   Follow-up    Breathing has been worse over the past 6 months. She states it started with swelling in her face. She gets winded walking short distances such as lobby to exam room today. Going out in the heat makes breathing worse. She is coughing- prod with white sputum.  She uses her albuterol inhaler about once per day and uses the [place] once daily.    Dyspnea:  [employer] shopping slower than others  and stops p 171ft =  MMRC3 = can't walk 100 yards even at a slow pace at a flat grade s stopping due to sob   Cough: worse in am and hs > white  Sleeping: head of bed is raised and 3 pillow SABA use: once a day hfa/ no neb  02: none    Lung cancer screening: not done as  of 09/05/2022   Plans new ENT eval  July 8th 2024   No obvious day to day or daytime variability or assoc excess/ purulent sputum or mucus plugs or hemoptysis or cp or chest tightness, subjective wheeze or overt sinus or hb symptoms.    . Also denies any obvious fluctuation of symptoms with weather or environmental changes or other aggravating or alleviating factors except as outlined above   No unusual exposure hx or h/o childhood pna/ asthma or knowledge of premature birth.  Current Allergies, Complete Past Medical History, Past Surgical History, Family History, and Social History were reviewed in [employer] record.  ROS  The following are not active complaints unless bolded Hoarseness, sore throat, dysphagia, dental problems, itching, sneezing,  nasal congestion or discharge of excess mucus or purulent secretions, ear ache,   fever, chills, sweats, unintended wt loss or wt gain, classically pleuritic or exertional cp,  orthopnea pnd or arm/hand swelling  or leg swelling, presyncope, palpitations, abdominal pain, anorexia, nausea, vomiting, diarrhea  or change in bowel habits or change in bladder habits, change in stools or change in urine, dysuria, hematuria,  rash, arthralgias, visual complaints, headache, numbness, weakness or ataxia or problems with walking or coordination,  change in Carrillo or  memory.        Current Meds  Medication Sig   acetaminophen (TYLENOL) 650 MG CR tablet Take 650-1,300 mg by mouth every 8 (eight) hours as needed for pain.   albuterol (PROVENTIL HFA;VENTOLIN HFA) 108 (90 BASE) MCG/ACT inhaler Inhale 2 puffs into the lungs every 6 (six) hours as needed for shortness of breath.   albuterol (PROVENTIL) (2.5 MG/3ML) 0.083% nebulizer solution Take 3 mLs (2.5 mg total) by nebulization every 4 (four) hours as needed for wheezing or shortness of breath.   Budeson-Glycopyrrol-Formoterol (BREZTRI AEROSPHERE) 160-9-4.8 MCG/ACT AERO Inhale 1 puff into the  lungs daily.   cholecalciferol (VITAMIN D3) 25 MCG (1000 UNIT) tablet Take 1,000 Units by mouth daily.   fexofenadine (ALLEGRA) 180 MG tablet Take 180 mg by mouth daily.   levothyroxine (SYNTHROID) 112 MCG tablet Take 112 mcg by mouth daily before breakfast.   pantoprazole (PROTONIX) 40 MG tablet Take 1 tablet (40 mg total) by mouth daily. 30 minutes before breakfast   rifaximin (XIFAXAN) 550 MG TABS tablet Take 550 mg by mouth 3 (three) times daily.   vitamin B-12 (CYANOCOBALAMIN) 500 MCG tablet Take 500 mcg by mouth daily.  vitamin C (ASCORBIC ACID) 500 MG tablet Take 500 mg by mouth daily.            Past Medical History:  Diagnosis Date   Asthma    Cancer (HCC)    Chronic abdominal pain    Chronic back pain    Chronic neck pain    Chronic pain    Colon cancer (HCC)    COPD (chronic obstructive pulmonary disease) (HCC)    Hemorrhoids    IBS (irritable bowel syndrome)    Migraines        Objective:    Wts  09/05/2022        130  08/31/2021        132  07/19/2021          135 01/12/2021        139   11/12/2020        140   07/29/20 140 lb 12.8 oz (63.9 kg)  03/31/15 165 lb 9.6 oz (75.1 kg)  08/11/14 170 lb (77.1 kg)     Vital signs reviewed  09/05/2022  - Note at rest 02 sats  95% on RA   General appearance:    chronically ill elderly wf nad   HEENT : Oropharynx  clear       NECK :  without  apparent JVD/ palpable Nodes/TM    LUNGS: no acc muscle use,  Mild barrel  contour chest wall with distant wheeze pan exp/ better with plm and  without cough on insp or exp maneuvers  and mild  Hyperresonant  to  percussion bilaterally     CV:  RRR  no s3 or murmur or increase in P2, and no edema   ABD:  soft and nontender with pos end  insp Hoover's  in the supine position.  No bruits or organomegaly appreciated   MS:  Nl gait/ ext warm without deformities Or obvious joint restrictions  calf tenderness, cyanosis or clubbing     SKIN: warm and dry without lesions     NEURO:  alert, approp, nl sensorium with  no motor or cerebellar deficits apparent.        Assessment

## 2022-09-05 ENCOUNTER — Encounter: Payer: Self-pay | Admitting: Internal Medicine

## 2022-09-05 ENCOUNTER — Ambulatory Visit (INDEPENDENT_AMBULATORY_CARE_PROVIDER_SITE_OTHER): Payer: 59 | Admitting: Internal Medicine

## 2022-09-05 VITALS — BP 132/70 | HR 45 | Ht 64.0 in | Wt 130.8 lb

## 2022-09-05 DIAGNOSIS — J449 Chronic obstructive pulmonary disease, unspecified: Secondary | ICD-10-CM

## 2022-09-05 DIAGNOSIS — F17210 Nicotine dependence, cigarettes, uncomplicated: Secondary | ICD-10-CM

## 2022-09-05 NOTE — Assessment & Plan Note (Signed)
Referred for LDCT  08/31/2021 >>> and again 09/05/2022   Counseled re importance of smoking cessation but did not meet time criteria for separate billing    Low-dose CT lung cancer screening is recommended for patients who are 107-66 years of age with a 20+ pack-year history of smoking and who are currently smoking or quit <=15 years ago. No coughing up blood  No unintentional weight loss of > 15 pounds in the last 6 months - pt is eligible for scanning yearly until age 28 by present guidelines  Discussed in detail all the  indications, usual  risks and alternatives  relative to the benefits with patient who agrees to proceed with w/u as outlined.            Each maintenance medication was reviewed in detail including emphasizing most importantly the difference between maintenance and prns and under what circumstances the prns are to be triggered using an action plan format where appropriate.  Total time for H and P, chart review, counseling, reviewing hfa/neb device(s) and generating customized AVS unique to this office visit / same day charting  > 30 min for multiple  refractory respiratory  symptoms of uncertain etiology

## 2022-09-05 NOTE — Assessment & Plan Note (Signed)
Active smoker/ MZ - 07/29/2020  After extensive coaching inhaler device,  effectiveness =   spiriva smi 2.5 mg q am and prn saba - 08/11/2020 started daily pred 20 mg until better then 10 mg daily > caused thrush so stopped - PFT's 11/04/20   FEV1 1.31  (52 % ) ratio 0.53  p 20 % improvement from saba p ? prior to study with FV curve poor insp flow but no true plateau, classic exp concavity.  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 2) If the pt is convinced, as many are, that saba helps recover from activity faster then it's easy to tell if this is the case by re-challenging : ie stop, take the inhaler, then p 5 minutes try the exact same activity (intensity of workload) that just caused the symptoms and see if they are substantially diminished or not after saba 3) if there is an activity that reproducibly causes the symptoms, try the saba 15 min before the activity on alternate days   If in fact the saba really does help, then fine to continue to use it prn but advised may need to look closer at the maintenance regimen being used to achieve better control of airways disease with exertion.

## 2022-09-05 NOTE — Patient Instructions (Addendum)
My office will be contacting you by phone for referral for Lung cancer screening   - if you don't hear back from my office within one week please call us back or notify us thru MyChart and we'll address it right away.   Plan A = Automatic = Always=  Breztri Take 2 puffs first thing in am and then another 2 puffs about 12 hours later.    Work on inhaler technique:  relax and gently blow all the way out then take a nice smooth full deep breath back in, triggering the inhaler at same time you start breathing in.  Hold breath in for at least  5 seconds if you can. Blow out breztri  thru nose. Rinse and gargle with water when done.  If mouth or throat bother you at all,  try brushing teeth/gums/tongue with arm and hammer toothpaste/ make a slurry and gargle and spit out.  - practice taking deep breaths with an empty cannister of breztri       Plan B = Backup (to supplement plan A, not to replace it) Only use your albuterol inhaler as a rescue medication to be used if you can't catch your breath by resting or doing a relaxed purse lip breathing pattern.  - The less you use it, the better it will work when you need it. - Ok to use the inhaler up to 2 puffs  every 4 hours if you must but call for appointment if use goes up over your usual need - Don't leave home without it !!  (think of it like the spare tire for your car)   Plan C = Crisis (instead of Plan B but only if Plan B stops working) - only use your albuterol nebulizer if you first try Plan B and it fails to help > ok to use the nebulizer up to every 4 hours but if start needing it regularly call for immediate appointment  The key is to stop smoking completely before smoking completely stops you!   Please schedule a follow up visit in 3 months but call sooner if needed

## 2022-09-07 DIAGNOSIS — M9905 Segmental and somatic dysfunction of pelvic region: Secondary | ICD-10-CM | POA: Diagnosis not present

## 2022-09-07 DIAGNOSIS — M9903 Segmental and somatic dysfunction of lumbar region: Secondary | ICD-10-CM | POA: Diagnosis not present

## 2022-09-07 DIAGNOSIS — M545 Low back pain, unspecified: Secondary | ICD-10-CM | POA: Diagnosis not present

## 2022-09-07 DIAGNOSIS — M9904 Segmental and somatic dysfunction of sacral region: Secondary | ICD-10-CM | POA: Diagnosis not present

## 2022-09-14 DIAGNOSIS — M9903 Segmental and somatic dysfunction of lumbar region: Secondary | ICD-10-CM | POA: Diagnosis not present

## 2022-09-14 DIAGNOSIS — M9905 Segmental and somatic dysfunction of pelvic region: Secondary | ICD-10-CM | POA: Diagnosis not present

## 2022-09-14 DIAGNOSIS — M545 Low back pain, unspecified: Secondary | ICD-10-CM | POA: Diagnosis not present

## 2022-09-14 DIAGNOSIS — M9904 Segmental and somatic dysfunction of sacral region: Secondary | ICD-10-CM | POA: Diagnosis not present

## 2022-09-16 ENCOUNTER — Other Ambulatory Visit: Payer: Self-pay | Admitting: *Deleted

## 2022-09-16 DIAGNOSIS — Z87891 Personal history of nicotine dependence: Secondary | ICD-10-CM

## 2022-09-16 DIAGNOSIS — F17210 Nicotine dependence, cigarettes, uncomplicated: Secondary | ICD-10-CM

## 2022-09-16 DIAGNOSIS — Z122 Encounter for screening for malignant neoplasm of respiratory organs: Secondary | ICD-10-CM

## 2022-09-19 DIAGNOSIS — K219 Gastro-esophageal reflux disease without esophagitis: Secondary | ICD-10-CM | POA: Diagnosis not present

## 2022-09-19 DIAGNOSIS — J309 Allergic rhinitis, unspecified: Secondary | ICD-10-CM | POA: Diagnosis not present

## 2022-09-19 DIAGNOSIS — R09A2 Foreign body sensation, throat: Secondary | ICD-10-CM | POA: Diagnosis not present

## 2022-09-19 DIAGNOSIS — H9313 Tinnitus, bilateral: Secondary | ICD-10-CM | POA: Diagnosis not present

## 2022-09-19 DIAGNOSIS — J381 Polyp of vocal cord and larynx: Secondary | ICD-10-CM | POA: Diagnosis not present

## 2022-09-19 DIAGNOSIS — R0981 Nasal congestion: Secondary | ICD-10-CM | POA: Diagnosis not present

## 2022-09-27 DIAGNOSIS — M9905 Segmental and somatic dysfunction of pelvic region: Secondary | ICD-10-CM | POA: Diagnosis not present

## 2022-09-27 DIAGNOSIS — M546 Pain in thoracic spine: Secondary | ICD-10-CM | POA: Diagnosis not present

## 2022-09-27 DIAGNOSIS — M9903 Segmental and somatic dysfunction of lumbar region: Secondary | ICD-10-CM | POA: Diagnosis not present

## 2022-09-27 DIAGNOSIS — M545 Low back pain, unspecified: Secondary | ICD-10-CM | POA: Diagnosis not present

## 2022-10-03 DIAGNOSIS — Z1211 Encounter for screening for malignant neoplasm of colon: Secondary | ICD-10-CM | POA: Diagnosis not present

## 2022-10-03 DIAGNOSIS — R5382 Chronic fatigue, unspecified: Secondary | ICD-10-CM | POA: Diagnosis not present

## 2022-10-03 DIAGNOSIS — Z1231 Encounter for screening mammogram for malignant neoplasm of breast: Secondary | ICD-10-CM | POA: Diagnosis not present

## 2022-10-25 ENCOUNTER — Ambulatory Visit (INDEPENDENT_AMBULATORY_CARE_PROVIDER_SITE_OTHER): Payer: 59 | Admitting: Primary Care

## 2022-10-25 ENCOUNTER — Encounter: Payer: Self-pay | Admitting: Primary Care

## 2022-10-25 DIAGNOSIS — F17200 Nicotine dependence, unspecified, uncomplicated: Secondary | ICD-10-CM

## 2022-10-25 DIAGNOSIS — F17210 Nicotine dependence, cigarettes, uncomplicated: Secondary | ICD-10-CM

## 2022-10-25 NOTE — Progress Notes (Signed)
Virtual Visit via Telephone Note  I connected with Paula Carrillo on 10/25/22 at  4:00 PM EDT by telephone and verified that I am speaking with the correct person using two identifiers.  Location: Patient: Home Provider: Office   I discussed the limitations, risks, security and privacy concerns of performing an evaluation and management service by telephone and the availability of in person appointments. I also discussed with the patient that there may be a patient responsible charge related to this service. The patient expressed understanding and agreed to proceed.  Virtual Visit via Telephone Note  I connected with Paula Carrillo on 10/25/22 at  4:00 PM EDT by telephone and verified that I am speaking with the correct person using two identifiers.  Location: Patient: Home Provider: Office   I discussed the limitations, risks, security and privacy concerns of performing an evaluation and management service by telephone and the availability of in person appointments. I also discussed with the patient that there may be a patient responsible charge related to this service. The patient expressed understanding and agreed to proceed.  Shared Decision Making Visit Lung Cancer Screening Program [phone])   Eligibility: Age 67 y.o. Pack Years Smoking History Calculation 59 (# packs/per year x # years smoked) Recent History of coughing up blood  no Unexplained weight loss? no ( >Than 15 pounds within the last 6 months ) Prior History Lung / other cancer no (Diagnosis within the last 5 years already requiring surveillance chest CT Scans). Smoking Status Current Smoker Former Smokers: Years since quit: NA  Quit Date: Na  Visit Components: Discussion included one or more decision making aids. yes Discussion included risk/benefits of screening. yes Discussion included potential follow up diagnostic testing for abnormal scans. yes Discussion included meaning and risk of over diagnosis.  yes Discussion included meaning and risk of False Positives. yes Discussion included meaning of total radiation exposure. yes  Counseling Included: Importance of adherence to annual lung cancer LDCT screening. yes Impact of comorbidities on ability to participate in the program. yes Ability and willingness to under diagnostic treatment. yes  Smoking Cessation Counseling: Current Smokers:  Discussed importance of smoking cessation. yes Information about tobacco cessation classes and interventions provided to patient. yes Patient provided with "ticket" for LDCT Scan. NA Symptomatic Patient. no  Counseling(Intermediate counseling: > three minutes) [id number] Diagnosis Code: Tobacco Use Z72.0 Asymptomatic Patient yes  Counseling (Intermediate counseling: > three minutes counseling) [id number] Former Smokers:  Discussed the importance of maintaining cigarette abstinence. yes Diagnosis Code: Personal History of Nicotine Dependence. W29.562 Information about tobacco cessation classes and interventions provided to patient. Yes Patient provided with "ticket" for LDCT Scan. NA Written Order for Lung Cancer Screening with LDCT placed in Epic. Yes (CT Chest Lung Cancer Screening Low Dose W/O CM) [id number] Z12.2-Screening of respiratory organs Z87.891-Personal history of nicotine dependence  I have spent 25 minutes of face to face/ virtual visit time with Paula Carrillo discussing the risks and benefits of lung cancer screening. We viewed / discussed a power point together that explained in detail the above noted topics. We paused at intervals to allow for questions to be asked and answered to ensure understanding.We discussed that the single most powerful action that she can take to decrease her risk of developing lung cancer is to quit smoking. We discussed whether or not she is ready to commit to setting a quit date. We discussed options for tools to aid in quitting smoking including nicotine replacement therapy,  non-nicotine medications, support  groups, Quit Smart classes, and behavior modification. We discussed that often times setting smaller, more achievable goals, such as eliminating 1 cigarette a day for a week and then 2 cigarettes a day for a week can be helpful in slowly decreasing the number of cigarettes smoked. This allows for a sense of accomplishment as well as providing a clinical benefit. I provided  her  with smoking cessation  information  with contact information for community resources, classes, free nicotine replacement therapy, and access to mobile apps, text messaging, and on-line smoking cessation help. I have also provided  her  the office contact information in the event she needs to contact me, or the screening staff. We discussed the time and location of the scan, and that either Abigail Miyamoto RN, Karlton Lemon, RN  or I will call / send a letter with the results within 24-72 hours of receiving them. The patient verbalized understanding of all of  the above and had no further questions upon leaving the office. They have my contact information in the event they have any further questions.  I spent 3-5 minutes counseling on smoking cessation and the health risks of continued tobacco abuse.  I explained to the patient that there has been a high incidence of coronary artery disease noted on these exams. I explained that this is a non-gated exam therefore degree or severity cannot be determined. This patient is not on statin therapy. I have asked the patient to follow-up with their PCP regarding any incidental finding of coronary artery disease and management with diet or medication as their PCP  feels is clinically indicated. The patient verbalized understanding of the above and had no further questions upon completion of the visit.    Glenford Bayley, NP

## 2022-10-25 NOTE — Patient Instructions (Signed)

## 2022-10-26 DIAGNOSIS — R918 Other nonspecific abnormal finding of lung field: Secondary | ICD-10-CM | POA: Diagnosis not present

## 2022-10-26 DIAGNOSIS — I2510 Atherosclerotic heart disease of native coronary artery without angina pectoris: Secondary | ICD-10-CM | POA: Diagnosis not present

## 2022-10-26 DIAGNOSIS — Z7951 Long term (current) use of inhaled steroids: Secondary | ICD-10-CM | POA: Diagnosis not present

## 2022-10-26 DIAGNOSIS — J449 Chronic obstructive pulmonary disease, unspecified: Secondary | ICD-10-CM | POA: Diagnosis not present

## 2022-10-26 DIAGNOSIS — J441 Chronic obstructive pulmonary disease with (acute) exacerbation: Secondary | ICD-10-CM | POA: Diagnosis not present

## 2022-10-26 DIAGNOSIS — Z720 Tobacco use: Secondary | ICD-10-CM | POA: Diagnosis not present

## 2022-10-26 DIAGNOSIS — G8929 Other chronic pain: Secondary | ICD-10-CM | POA: Diagnosis not present

## 2022-10-26 DIAGNOSIS — J432 Centrilobular emphysema: Secondary | ICD-10-CM | POA: Diagnosis not present

## 2022-10-26 DIAGNOSIS — R079 Chest pain, unspecified: Secondary | ICD-10-CM | POA: Diagnosis not present

## 2022-10-26 DIAGNOSIS — F17210 Nicotine dependence, cigarettes, uncomplicated: Secondary | ICD-10-CM | POA: Diagnosis not present

## 2022-10-26 DIAGNOSIS — R071 Chest pain on breathing: Secondary | ICD-10-CM | POA: Diagnosis not present

## 2022-10-26 DIAGNOSIS — Z79899 Other long term (current) drug therapy: Secondary | ICD-10-CM | POA: Diagnosis not present

## 2022-10-26 DIAGNOSIS — M545 Low back pain, unspecified: Secondary | ICD-10-CM | POA: Diagnosis not present

## 2022-10-26 DIAGNOSIS — J40 Bronchitis, not specified as acute or chronic: Secondary | ICD-10-CM | POA: Diagnosis not present

## 2022-10-27 ENCOUNTER — Ambulatory Visit (HOSPITAL_COMMUNITY)
Admission: RE | Admit: 2022-10-27 | Discharge: 2022-10-27 | Disposition: A | Discharge status: Home / Self Care | Payer: 59 | Source: Ambulatory Visit | Attending: Acute Care | Admitting: Acute Care

## 2022-10-27 DIAGNOSIS — F17210 Nicotine dependence, cigarettes, uncomplicated: Secondary | ICD-10-CM | POA: Insufficient documentation

## 2022-10-27 DIAGNOSIS — Z122 Encounter for screening for malignant neoplasm of respiratory organs: Secondary | ICD-10-CM | POA: Diagnosis not present

## 2022-10-27 DIAGNOSIS — Z87891 Personal history of nicotine dependence: Secondary | ICD-10-CM | POA: Diagnosis not present

## 2022-11-01 DIAGNOSIS — E875 Hyperkalemia: Secondary | ICD-10-CM | POA: Diagnosis not present

## 2022-11-01 DIAGNOSIS — E785 Hyperlipidemia, unspecified: Secondary | ICD-10-CM | POA: Diagnosis not present

## 2022-11-01 DIAGNOSIS — F17200 Nicotine dependence, unspecified, uncomplicated: Secondary | ICD-10-CM | POA: Diagnosis not present

## 2022-11-03 ENCOUNTER — Telehealth: Payer: Self-pay | Admitting: Acute Care

## 2022-11-03 DIAGNOSIS — E785 Hyperlipidemia, unspecified: Secondary | ICD-10-CM | POA: Diagnosis not present

## 2022-11-03 DIAGNOSIS — F17210 Nicotine dependence, cigarettes, uncomplicated: Secondary | ICD-10-CM | POA: Diagnosis not present

## 2022-11-03 DIAGNOSIS — R079 Chest pain, unspecified: Secondary | ICD-10-CM | POA: Diagnosis not present

## 2022-11-03 NOTE — Telephone Encounter (Signed)
Call report [phone]

## 2022-11-03 NOTE — Telephone Encounter (Signed)
CALL REPORT CT: IMPRESSION: 1. Increasingly conspicuous mural nodule associated with a cyst in the periphery of the right lower lobe categorized as Lung-RADS 4AS, suspicious. Follow up low-dose chest CT without contrast in 3 months (please use the following order, "CT CHEST LCS NODULE FOLLOW-UP W/O CM") is recommended. Alternatively, PET may be considered when there is a solid component 8mm or larger. 2. The "S" modifier above refers to potentially clinically significant non lung cancer related findings. Specifically, there is aortic atherosclerosis, in addition to left main and left anterior descending coronary artery disease. Please note that although the presence of coronary artery calcium documents the presence of coronary artery disease, the severity of this disease and any potential stenosis cannot be assessed on this non-gated CT examination. Assessment for potential risk factor modification, dietary therapy or pharmacologic therapy may be warranted, if clinically indicated. 3. Mild diffuse bronchial wall thickening with mild to moderate centrilobular and paraseptal emphysema; imaging findings suggestive of underlying COPD.  Spoke with [facility] to confirm receipt.   Forwarding to Maralyn Sago.

## 2022-11-10 ENCOUNTER — Other Ambulatory Visit: Payer: Self-pay | Admitting: Acute Care

## 2022-11-10 ENCOUNTER — Telehealth: Payer: Self-pay | Admitting: Acute Care

## 2022-11-10 DIAGNOSIS — R911 Solitary pulmonary nodule: Secondary | ICD-10-CM

## 2022-11-10 NOTE — Telephone Encounter (Signed)
Results/plan faxed to PCP 

## 2022-11-10 NOTE — Telephone Encounter (Signed)
Patient also had cardiac findings consistent with left main and left anterior descending coronary artery disease.  Additionally there was notation of aortic atherosclerosis. Patient is currently being worked up through the [employer] system from a cardiac perspective.  She has echo and stress test scheduled and will follow-up with the [facility] physicians.

## 2022-11-10 NOTE — Telephone Encounter (Signed)
I have called the patient with the results of her screening scan. I explained that her scan was read as a LR 4 A, however this is an area that is changing and it is big enough to PET. I have discussed this with Dr. Tonia Brooms who agrees with PET scan now. Patient is in agreement with this plan.She understands that she will get a call to get this scheduled. She will need a follow up televisit ( She lives in [place]) with me to discuss results about a week after her scan is done.  She verbalized understanding of this also. Please fax results to PCP and let them know plan is for a PET scan and follow-up with pulmonary. If PET scan is negative she will need to return to the lung cancer screening patient population. Thank you so much PET scan has been ordered.

## 2022-11-17 ENCOUNTER — Ambulatory Visit (HOSPITAL_COMMUNITY)
Admission: RE | Admit: 2022-11-17 | Discharge: 2022-11-17 | Disposition: A | Discharge status: Home / Self Care | Payer: 59 | Source: Ambulatory Visit | Attending: Acute Care | Admitting: Acute Care

## 2022-11-17 DIAGNOSIS — R911 Solitary pulmonary nodule: Secondary | ICD-10-CM | POA: Insufficient documentation

## 2022-11-18 DIAGNOSIS — R079 Chest pain, unspecified: Secondary | ICD-10-CM | POA: Diagnosis not present

## 2022-11-18 DIAGNOSIS — J984 Other disorders of lung: Secondary | ICD-10-CM | POA: Diagnosis not present

## 2022-11-22 ENCOUNTER — Telehealth: Payer: 59 | Admitting: Acute Care

## 2022-11-22 DIAGNOSIS — R079 Chest pain, unspecified: Secondary | ICD-10-CM | POA: Diagnosis not present

## 2022-11-22 DIAGNOSIS — R0789 Other chest pain: Secondary | ICD-10-CM | POA: Diagnosis not present

## 2022-11-24 ENCOUNTER — Ambulatory Visit (HOSPITAL_COMMUNITY)
Admission: RE | Admit: 2022-11-24 | Discharge: 2022-11-24 | Disposition: A | Discharge status: Home / Self Care | Payer: 59 | Source: Ambulatory Visit | Attending: Acute Care | Admitting: Acute Care

## 2022-11-24 DIAGNOSIS — R918 Other nonspecific abnormal finding of lung field: Secondary | ICD-10-CM | POA: Diagnosis not present

## 2022-11-24 DIAGNOSIS — R911 Solitary pulmonary nodule: Secondary | ICD-10-CM | POA: Diagnosis not present

## 2022-11-24 MED ORDER — FLUDEOXYGLUCOSE F - 18 (FDG) INJECTION
6.0700 | Freq: Once | INTRAVENOUS | Status: AC | PRN
  Administered 2022-11-24: 6.07 via INTRAVENOUS

## 2022-11-28 DIAGNOSIS — E890 Postprocedural hypothyroidism: Secondary | ICD-10-CM | POA: Diagnosis not present

## 2022-11-28 DIAGNOSIS — C73 Malignant neoplasm of thyroid gland: Secondary | ICD-10-CM | POA: Diagnosis not present

## 2022-12-05 NOTE — Progress Notes (Unsigned)
Paula Carrillo, female    DOB: [birth year]     MRN: [id number]   Brief patient profile:  9 yowf MZ/active smoker says dx with copd around 2012 and maint on advair and spiriva and referred to pulmonary clinic in [place]  07/29/2020 by Dr   Paula Carrillo with worsening sob/cough with globus on advair/spiriva dpi    07/13/20 ent eval: moderate interarytenoid edema and erythema consistent with laryngopharyngeal reflux. 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 Referred to [facility] voice center Thyroid nodule x 4 cm > thyroid US >  plans  thyroidectomy at [facility]     History of Present Illness  07/29/2020  Pulmonary/ 1st office eval/ Paula Carrillo / [name] Office  Chief Complaint  Patient presents with   Consult    Productive cough with white phlegm, sore throat "something growing in it" since March 2022  Dyspnea:  [employer] shopping but has to go to slow stops each aisle = MMRC3 = can't walk 100 yards even at a slow pace at a flat grade s stopping due to sob   Cough: sense of throat congestion/ globus  on advair/ spiriva  Sleep: < 30 degrees /bed is flat with pillows  SABA use: very confused re how/ when to use it 02  None  rec The key is to stop smoking completely before smoking completely stops you! Plan A = Automatic = Always=    spiriva 2 puffs each am and stop powdered advair and spiriva  Prednisone 10 mg take  4 each am x 2 days,   2 each am x 2 days,  1 each am x 2 days and stop  Plan B = Backup (to supplement plan A, not to replace it) Only use your albuterol inhaler as a rescue medicatio Plan C = Crisis (instead of Plan B but only if Plan B stops working) - only use your albuterol nebulizer if you first try Plan B and it fails to help > ok to use the nebulizer up to every 4 hours but if start needing it regularly call for immediate appointment GERD  diet  Please schedule a follow up office visit in 2 weeks, call sooner if needed with  all medications /inhalers/ solutions in hand so we can verify exactly what you are taking. This includes all medications from all doctors and over the counters      12/16/20  [facility] thyroidectomy   08/31/2021  f/u ov/[place] office/Paula Carrillo re: GOLD 2 COPD  maint on breztri 2bid   Chief Complaint  Patient presents with   Follow-up    Cough is improving    Dyspnea:  [employer] shopping / cross parking lot s 02  Cough: better but still smoker worse in am  Sleeping: bed is flat/ raises head sev pillows SABA use: avg twice daily /  02: none  Covid status: none, never infected  Lung cancer screening: referred today  Rec The key is to stop smoking completely before smoking completely stops you! My office will be contacting you by phone for referral for lung cancer screening > not done as of 09/05/2022   Plan A = Automatic = Always=    breztri   Plan B = Backup (to supplement plan A, not to replace it) Only use your albuterol inhaler as a rescue medication  Plan C = Crisis (instead of Plan B but only if Plan B stops working) - only use your albuterol nebulizer if you first try  Plan B      09/05/2022  yearly f/u ov/[place] office/Paula Carrillo re: GOLD 2 COPD/ MZ maint on breztri x 2 each am  only  Chief Complaint  Patient presents with   Follow-up    Breathing has been worse over the past 6 months. She states it started with swelling in her face. She gets winded walking short distances such as lobby to exam room today. Going out in the heat makes breathing worse. She is coughing- prod with white sputum.  She uses her albuterol inhaler about once per day and uses the [place] once daily.    Dyspnea:  [employer] shopping slower than others  and stops p 186ft =  MMRC3 = can't walk 100 yards even at a slow pace at a flat grade s stopping due to sob   Cough: worse in am and hs > white  Sleeping: head of bed is raised and 3 pillow SABA use: once a day hfa/ no neb  02: none  Plans new ENT eval  July 8th 2024   Rec  Plan A = Automatic = Always=  Breztri Take 2 puffs first thing in am and then another 2 puffs about 12 hours later.  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   10/26/21   LDSCT pos nodule  11/24/22  PET done   12/06/2022  f/u ov/[place] office/Paula Carrillo re: *** maint on ***  No chief complaint on file.   Dyspnea:  *** Cough: *** Sleeping: ***   resp cc  SABA use: *** 02: ***  Lung cancer screening: ***   No obvious day to day or daytime variability or assoc excess/ purulent sputum or mucus plugs or hemoptysis or cp or chest tightness, subjective wheeze or overt sinus or hb symptoms.    Also denies any obvious fluctuation of symptoms with weather or environmental changes or other aggravating or alleviating factors except as outlined above   No unusual exposure hx or h/o childhood pna/ asthma or knowledge of premature birth.  Current Allergies, Complete Past Medical History, Past Surgical History, Family History, and Social History were reviewed in [employer] record.  ROS  The following are not active complaints unless bolded Hoarseness, sore throat, dysphagia, dental problems, itching, sneezing,  nasal congestion or discharge of excess mucus or purulent secretions, ear ache,   fever, chills, sweats, unintended wt loss or wt gain, classically pleuritic or exertional cp,  orthopnea pnd or arm/hand swelling  or leg swelling, presyncope, palpitations, abdominal pain, anorexia, nausea, vomiting, diarrhea  or change in bowel habits or change in bladder habits, change in stools or change in urine, dysuria, hematuria,  rash, arthralgias, visual complaints, headache,  numbness, weakness or ataxia or problems with walking or coordination,  change in Carrillo or  memory.        No outpatient medications have been marked as taking for the 12/06/22 encounter (Appointment) with Nyoka Cowden, MD.          Past Medical History:  Diagnosis Date   Asthma    Cancer [facility])    Chronic abdominal pain    Chronic back pain  Chronic neck pain    Chronic pain    Colon cancer (HCC)    COPD (chronic obstructive pulmonary disease) (HCC)    Hemorrhoids    IBS (irritable bowel syndrome)    Migraines        Objective:    Wts  12/06/2022         ***  09/05/2022        130  08/31/2021        132  07/19/2021          135 01/12/2021        139   11/12/2020        140   07/29/20 140 lb 12.8 oz (63.9 kg)  03/31/15 165 lb 9.6 oz (75.1 kg)  08/11/14 170 lb (77.1 kg)     Vital signs reviewed  12/06/2022  - Note at rest 02 sats  ***% on ***   General appearance:    ***   Mild barr***      Assessment

## 2022-12-06 ENCOUNTER — Other Ambulatory Visit: Payer: Self-pay | Admitting: *Deleted

## 2022-12-06 ENCOUNTER — Ambulatory Visit: Payer: 59 | Admitting: Internal Medicine

## 2022-12-06 ENCOUNTER — Encounter: Payer: Self-pay | Admitting: Internal Medicine

## 2022-12-06 VITALS — BP 100/69 | HR 75 | Ht 64.0 in | Wt 130.4 lb

## 2022-12-06 DIAGNOSIS — F17200 Nicotine dependence, unspecified, uncomplicated: Secondary | ICD-10-CM

## 2022-12-06 DIAGNOSIS — Z122 Encounter for screening for malignant neoplasm of respiratory organs: Secondary | ICD-10-CM

## 2022-12-06 DIAGNOSIS — F17210 Nicotine dependence, cigarettes, uncomplicated: Secondary | ICD-10-CM | POA: Diagnosis not present

## 2022-12-06 DIAGNOSIS — J449 Chronic obstructive pulmonary disease, unspecified: Secondary | ICD-10-CM

## 2022-12-06 DIAGNOSIS — Z87891 Personal history of nicotine dependence: Secondary | ICD-10-CM

## 2022-12-06 MED ORDER — FAMOTIDINE 20 MG PO TABS: ORAL_TABLET | ORAL | 11 refills | Status: AC

## 2022-12-06 NOTE — Patient Instructions (Signed)
Plan A = Automatic = Always=    Breztri Take 2 puffs first thing in am and then another 2 puffs about 12 hours later.   Add pepcid 20 mg at bedtime   Plan B = Backup (to supplement plan A, not to replace it) Only use your albuterol inhaler as a rescue medication to be used if you can't catch your breath by resting or doing a relaxed purse lip breathing pattern.  - The less you use it, the better it will work when you need it. - Ok to use the inhaler up to 2 puffs  every 4 hours if you must but call for appointment if use goes up over your usual need - Don't leave home without it !!  (think of it like the spare tire for your car)   Plan C = Crisis (instead of Plan B but only if Plan B stops working) - only use your albuterol nebulizer if you first try Plan B and it fails to help > ok to use the nebulizer up to every 4 hours but if start needing it regularly call for immediate appointment  The key is to stop smoking completely before smoking completely stops you!  Please schedule a follow up visit in  6 months but call sooner if needed

## 2022-12-07 ENCOUNTER — Encounter: Payer: Self-pay | Admitting: Internal Medicine

## 2022-12-07 ENCOUNTER — Telehealth: Payer: 59 | Admitting: Acute Care

## 2022-12-07 NOTE — Assessment & Plan Note (Signed)
Active smoker/ MZ - 07/29/2020  After extensive coaching inhaler device,  effectiveness =   spiriva smi 2.5 mg q am and prn saba - 08/11/2020 started daily pred 20 mg until better then 10 mg daily > caused thrush so stopped - PFT's 11/04/20   FEV1 1.31  (52 % ) ratio 0.53  p 20 % improvement from saba p ? prior to study with FV curve poor insp flow but no true plateau, classic exp concavity.  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

## 2022-12-12 DIAGNOSIS — I493 Ventricular premature depolarization: Secondary | ICD-10-CM | POA: Diagnosis not present

## 2022-12-12 DIAGNOSIS — R002 Palpitations: Secondary | ICD-10-CM | POA: Diagnosis not present

## 2022-12-23 DIAGNOSIS — R002 Palpitations: Secondary | ICD-10-CM | POA: Diagnosis not present

## 2022-12-26 DIAGNOSIS — I493 Ventricular premature depolarization: Secondary | ICD-10-CM | POA: Diagnosis not present

## 2023-01-09 ENCOUNTER — Encounter: Payer: Self-pay | Admitting: Gastroenterology

## 2023-01-30 DIAGNOSIS — R079 Chest pain, unspecified: Secondary | ICD-10-CM | POA: Diagnosis not present

## 2023-04-20 DIAGNOSIS — E559 Vitamin D deficiency, unspecified: Secondary | ICD-10-CM | POA: Diagnosis not present

## 2023-04-20 DIAGNOSIS — M1990 Unspecified osteoarthritis, unspecified site: Secondary | ICD-10-CM | POA: Diagnosis not present

## 2023-04-20 DIAGNOSIS — E785 Hyperlipidemia, unspecified: Secondary | ICD-10-CM | POA: Diagnosis not present

## 2023-06-15 DIAGNOSIS — M25512 Pain in left shoulder: Secondary | ICD-10-CM | POA: Diagnosis not present

## 2023-06-15 DIAGNOSIS — M9907 Segmental and somatic dysfunction of upper extremity: Secondary | ICD-10-CM | POA: Diagnosis not present

## 2023-06-15 DIAGNOSIS — M9908 Segmental and somatic dysfunction of rib cage: Secondary | ICD-10-CM | POA: Diagnosis not present

## 2023-06-15 DIAGNOSIS — M9902 Segmental and somatic dysfunction of thoracic region: Secondary | ICD-10-CM | POA: Diagnosis not present

## 2023-06-27 DIAGNOSIS — R002 Palpitations: Secondary | ICD-10-CM | POA: Diagnosis not present

## 2023-06-27 DIAGNOSIS — R42 Dizziness and giddiness: Secondary | ICD-10-CM | POA: Diagnosis not present

## 2023-06-28 DIAGNOSIS — M542 Cervicalgia: Secondary | ICD-10-CM | POA: Diagnosis not present

## 2023-06-28 DIAGNOSIS — M9900 Segmental and somatic dysfunction of head region: Secondary | ICD-10-CM | POA: Diagnosis not present

## 2023-06-28 DIAGNOSIS — M9901 Segmental and somatic dysfunction of cervical region: Secondary | ICD-10-CM | POA: Diagnosis not present

## 2023-06-28 DIAGNOSIS — M25512 Pain in left shoulder: Secondary | ICD-10-CM | POA: Diagnosis not present

## 2023-07-04 DIAGNOSIS — R42 Dizziness and giddiness: Secondary | ICD-10-CM | POA: Diagnosis not present

## 2023-07-04 DIAGNOSIS — R002 Palpitations: Secondary | ICD-10-CM | POA: Diagnosis not present

## 2023-09-08 DIAGNOSIS — B353 Tinea pedis: Secondary | ICD-10-CM | POA: Diagnosis not present

## 2023-09-08 DIAGNOSIS — Z9181 History of falling: Secondary | ICD-10-CM | POA: Diagnosis not present

## 2023-10-22 NOTE — Progress Notes (Deleted)
 Paula Carrillo, female    DOB: February 28, 1956     MRN: [id number]   Brief patient profile:  64 yowf  MZ/active smoker says dx with copd around 2012 and maint on advair and spiriva  and referred to pulmonary clinic in [facility]  07/29/2020 by Dr Colette with worsening sob/cough with globus on advair/spiriva  dpi    07/13/20 ent eval: moderate interarytenoid edema and erythema consistent with laryngopharyngeal reflux. 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 Referred to [facility] voice center Thyroid  nodule x 4 cm > thyroid  us  >  plans  thyroidectomy at [facility]     History of Present Illness  07/29/2020  Pulmonary/ 1st office eval/ Paula / [facility] Office  Chief Complaint  Patient presents with   Consult    Productive cough with white phlegm, sore throat something growing in it since March 2022  Dyspnea:  [employer] shopping but has to go to slow stops each aisle = MMRC3 = can't walk 100 yards even at a slow pace at a flat grade s stopping due to sob   Cough: sense of throat congestion/ globus  on advair/ spiriva   Sleep: < 30 degrees /bed is flat with pillows  SABA use: very confused re how/ when to use it 02  None  rec The key is to stop smoking completely before smoking completely stops you! Plan A = Automatic = Always=    spiriva  2 puffs each am and stop powdered advair and spiriva   Prednisone  10 mg take  4 each am x 2 days,   2 each am x 2 days,  1 each am x 2 days and stop  Plan B = Backup (to supplement plan A, not to replace it) Only use your albuterol  inhaler as a rescue medicatio Plan C = Crisis (instead of Plan B but only if Plan B stops working) - only use your albuterol  nebulizer if you first try Plan B and it fails to help > ok to use the nebulizer up to every 4 hours but if start needing it regularly call for immediate appointment GERD  diet  Please schedule a follow up office visit in 2 weeks, call sooner if needed with all  medications /inhalers/ solutions in hand so we can verify exactly what you are taking. This includes all medications from all doctors and over the counters      12/16/20  [facility] thyroidectomy   08/31/2021  f/u ov/[place] office/Evonne Rinks re: GOLD 2 COPD  maint on breztri  2bid   Chief Complaint  Patient presents with   Follow-up    Cough is improving    Dyspnea:  [employer] shopping / cross parking lot s 02  Cough: better but still smoker worse in am  Sleeping: bed is flat/ raises head sev pillows SABA use: avg twice daily /  02: none  Covid status: none, never infected  Lung cancer screening: referred today  Rec The key is to stop smoking completely before smoking completely stops you! My office will be contacting you by phone for referral for lung cancer screening > not done as of 09/05/2022   Plan A = Automatic = Always=    breztri    Plan B = Backup (to supplement plan A, not to replace it) Only use your albuterol  inhaler as a rescue medication  Plan C = Crisis (instead of Plan B but only if Plan B stops working) - only use your albuterol  nebulizer if you first try Plan  B      09/05/2022  yearly f/u ov/[place] office/Jule Schlabach re: GOLD 2 COPD/ MZ maint on breztri  x 2 each am  only  Chief Complaint  Patient presents with   Follow-up    Breathing has been worse over the past 6 months. She states it started with swelling in her face. She gets winded walking short distances such as lobby to exam room today. Going out in the heat makes breathing worse. She is coughing- prod with white sputum.  She uses her albuterol  inhaler about once per day and uses the Breztri  once daily.    Dyspnea:  [employer] shopping slower than others  and stops p 125ft =  MMRC3 = can't walk 100 yards even at a slow pace at a flat grade s stopping due to sob   Cough: worse in am and hs > white  Sleeping: head of bed is raised and 3 pillow SABA use: once a day hfa/ no neb  02: none  Plans new ENT eval  July 8th 2024  Rec   Plan A = Automatic = Always=  Breztri  Take 2 puffs first thing in am and then another 2 puffs about 12 hours later.  Work on Garment/textile technologist B = Backup (to supplement plan A, not to replace it) Only use your albuterol  inhaler as a rescue medication  Plan C = Crisis (instead of Plan B but only if Plan B stops working) - only use your albuterol  nebulizer if you first try Plan B The key is to stop smoking completely before smoking completely stops you!   10/27/22   LDSCT pos nodule  11/24/22  PET done neg    12/06/2022  f/u ov/[place] office/Keyandre Pileggi re: GOLD 2 MZ maint on breztri    Chief Complaint  Patient presents with   Follow-up    3 month follow up   Dyspnea:  slower others at [facility] but says can do whole store: [facility] = can't walk a nl pace on a flat grade s sob but does fine slow and flat  Cough: better mostly min white but  more vol  in ams- takes about 30 min to clear  Sleeping: bed blocks with 2 pillows  with  noct palpitations and noct spells of gasping sev times a week   better p saba  SABA use: just noct hfa/ no saba daytime hfa or neb needed  02: none  Lung cancer screening: just completed f/u with neg pet as above  Rec      10/25/2023  f/u ov/[place] office/Minyon Billiter re: GOLD 2 MZ maint on ***  No chief complaint on file.   Dyspnea:  *** Cough: *** Sleeping: ***   resp cc  SABA use: *** 02: ***  Lung cancer screening: ***   No obvious day to day or daytime variability or assoc excess/ purulent sputum or mucus plugs or hemoptysis or cp or chest tightness, subjective wheeze or overt sinus or hb symptoms.    Also denies any obvious fluctuation of symptoms with weather or environmental changes or other aggravating or alleviating factors except as outlined above   No unusual exposure hx or h/o childhood pna/ asthma or knowledge of premature birth.  Current Allergies, Complete Past Medical History, Past Surgical History, Family History, and Social History were  reviewed in [employer] record.  ROS  The following are not active complaints unless bolded Hoarseness, sore throat, dysphagia, dental problems, itching, sneezing,  nasal congestion or discharge of excess  mucus or purulent secretions, ear ache,   fever, chills, sweats, unintended wt loss or wt gain, classically pleuritic or exertional cp,  orthopnea pnd or arm/hand swelling  or leg swelling, presyncope, palpitations, abdominal pain, anorexia, nausea, vomiting, diarrhea  or change in bowel habits or change in bladder habits, change in stools or change in urine, dysuria, hematuria,  rash, arthralgias, visual complaints, headache, numbness, weakness or ataxia or problems with walking or coordination,  change in mood or  memory.        No outpatient medications have been marked as taking for the 10/25/23 encounter (Appointment) with Paula Ozell NOVAK, MD.          Past Medical History:  Diagnosis Date   Asthma    Cancer [facility])    Chronic abdominal pain    Chronic back pain    Chronic neck pain    Chronic pain    Colon cancer (HCC)    COPD (chronic obstructive pulmonary disease) (HCC)    Hemorrhoids    IBS (irritable bowel syndrome)    Migraines        Objective:    Wts  10/25/2023          ***  12/06/2022        130  09/05/2022        130  08/31/2021        132  07/19/2021          135 01/12/2021        139   11/12/2020        140   07/29/20 140 lb 12.8 oz (63.9 kg)  03/31/15 165 lb 9.6 oz (75.1 kg)  08/11/14 170 lb (77.1 kg)     Vital signs reviewed  10/25/2023  - Note at rest 02 sats  ***% on ***   General appearance:    ***   edentulous   Mild barr***      Assessment

## 2023-10-25 ENCOUNTER — Ambulatory Visit: Admitting: Internal Medicine

## 2023-11-08 ENCOUNTER — Encounter: Payer: Self-pay | Admitting: Acute Care

## 2023-11-23 ENCOUNTER — Telehealth: Payer: Self-pay

## 2023-11-23 NOTE — Telephone Encounter (Signed)
 LVM to call office and schedule annual Lung CT.

## 2024-02-22 ENCOUNTER — Ambulatory Visit: Payer: Self-pay | Admitting: Internal Medicine

## 2024-02-22 ENCOUNTER — Other Ambulatory Visit: Payer: Self-pay | Admitting: Internal Medicine

## 2024-02-22 NOTE — Telephone Encounter (Unsigned)
 Copied from CRM #[id number]. Topic: Clinical - Medication Refill >> Feb 22, 2024 10:59 AM Ismael A wrote: Medication: albuterol  (PROVENTIL  HFA;VENTOLIN  HFA) 108 (90 BASE) MCG/ACT inhaler Budeson-Glycopyrrol-Formoterol (BREZTRI  AEROSPHERE) 160-9-4.8 MCG/ACT AERO  Has the patient contacted their pharmacy? No (Agent: If no, request that the patient contact the pharmacy for the refill. If patient does not wish to contact the pharmacy document the reason why and proceed with request.) (Agent: If yes, when and what did the pharmacy advise?)  This is the patient's preferred pharmacy:  [facility] [address] [place] - [address] [place] [address] Phone: [phone] Fax: [phone]  Is this the correct pharmacy for this prescription? Yes If no, delete pharmacy and type the correct one.   Has the prescription been filled recently? No  Is the patient out of the medication? Yes  Has the patient been seen for an appointment in the last year OR does the patient have an upcoming appointment? Yes  Can we respond through MyChart? No  Agent: Please be advised that Rx refills may take up to 3 business days. We ask that you follow-up with your pharmacy.

## 2024-02-22 NOTE — Telephone Encounter (Signed)
 1st attempt to contact patient for NT. Voice mailbox is full.   Copied from CRM #[id number]. Topic: Clinical - Medical Advice >> Feb 22, 2024 10:57 AM Paula Carrillo wrote: Reason for CRM: pt is wanting to know if it's ok for her to use Carrillo boost oxygen (it comes in Carrillo can with Carrillo green lid) to help her breathing  Past Medical History:  Diagnosis Date   Arthritis    Asthma    Cancer (HCC)    Chronic abdominal pain    Chronic back pain    Chronic neck pain    Chronic pain    Colon cancer (HCC)    COPD (chronic obstructive pulmonary disease) (HCC)    Hemorrhoids    IBS (irritable bowel syndrome)    Migraines    Thyroid  cancer (HCC)    Thyroid  disease

## 2024-02-22 NOTE — Telephone Encounter (Signed)
 Please advise

## 2024-02-22 NOTE — Telephone Encounter (Signed)
 FYI Only or Action Required?: Action required by provider: update on patient condition.  Patient is followed in Pulmonology for COPD, last seen on 12/06/2022 by Darlean Ozell NOVAK, MD.  Called Nurse Triage reporting Shortness of Breath.  Symptoms began several weeks ago.  Interventions attempted: Nothing.  Symptoms are: gradually worsening.  Triage Disposition: See HCP Within 4 Hours (Or PCP Triage)  Patient/caregiver understands and will follow disposition?: No  Copied from CRM #[id number]. Topic: Clinical - Medical Advice >> Feb 22, 2024 10:57 AM Ismael A wrote: Reason for CRM: pt is wanting to know if it's ok for her to use a boost oxygen (it comes in a can with a green lid) to help her breathing Reason for Disposition  [1] MILD difficulty breathing (e.g., minimal/no SOB at rest, SOB with walking, pulse < 100) AND [2] NEW-onset or WORSE than normal  Answer Assessment - Initial Assessment Questions Shortness of breath at rest, worsening with activity.  At night, patient feels like she is choked while she is sleeping.  Patient called to find out if it is ok to use Boost Oxygen from [employer]  She has been out of her maintenance and rescue inhalers for a while  1. RESPIRATORY STATUS: Describe your breathing? (e.g., wheezing, shortness of breath, unable to speak, severe coughing)      Shortness of breath 2. ONSET: When did this breathing problem begin?      Two weeks, when weather turned cold 3. PATTERN Does the difficult breathing come and go, or has it been constant since it started?      Worsens with activity 4. SEVERITY: How bad is your breathing? (e.g., mild, moderate, severe)      mild  6. CARDIAC HISTORY: Do you have any history of heart disease? (e.g., heart attack, angina, bypass surgery, angioplasty)      none 7. LUNG HISTORY: Do you have any history of lung disease?  (e.g., pulmonary embolus, asthma, emphysema)     COPD, asthma 8. CAUSE: What do you think is  causing the breathing problem?      Cold, dry weather 9. OTHER SYMPTOMS: Do you have any other symptoms? (e.g., chest pain, cough, dizziness, fever, runny nose)     Posterior lung pain, productive cough, white phlegm 10. O2 SATURATION MONITOR:  Do you use an oxygen saturation monitor (pulse oximeter) at home? If Yes, ask: What is your reading (oxygen level) today? What is your usual oxygen saturation reading? (e.g., 95%)       Not measured  Protocols used: Breathing Difficulty-A-AH

## 2024-02-23 MED ORDER — ALBUTEROL SULFATE HFA 108 (90 BASE) MCG/ACT IN AERS: 2.0000 | INHALATION_SPRAY | RESPIRATORY_TRACT | 2 refills | Status: AC | PRN

## 2024-02-23 MED ORDER — BREZTRI AEROSPHERE 160-9-4.8 MCG/ACT IN AERO: 1.0000 | INHALATION_SPRAY | Freq: Every day | RESPIRATORY_TRACT | 11 refills | Status: AC

## 2024-02-23 NOTE — Telephone Encounter (Signed)
 I called and spoke to pt. Pt informed of Dr Chari note and pt verbalized understanding. I informed pt that until her appt, if she has any worsening symptoms to please go to the ER. Pt verbalized understanding. NFN

## 2024-02-28 NOTE — Progress Notes (Incomplete)
 New Patient Pulmonology Office Visit   Subjective:  Patient ID: Paula Carrillo, female    DOB: 1955-09-08  MRN: [id number]  Referred by: Tobie Guy, DO  CC: No chief complaint on file.   HPI Paula Carrillo is a 68 y.o. female with MZ, active smoker, COPD on advair and spiriva  , on breztri >    {PULM QUESTIONNAIRES (Optional):33196}  ROS  Allergies: Sulfa antibiotics Current Medications[1] Past Medical History:  Diagnosis Date   Arthritis    Asthma    Cancer (HCC)    Chronic abdominal pain    Chronic back pain    Chronic neck pain    Chronic pain    Colon cancer (HCC)    COPD (chronic obstructive pulmonary disease) (HCC)    Hemorrhoids    IBS (irritable bowel syndrome)    Migraines    Thyroid  cancer (HCC)    Thyroid  disease    Past Surgical History:  Procedure Laterality Date   ABDOMINAL HYSTERECTOMY     APPENDECTOMY     BIOPSY  09/06/2021   Procedure: BIOPSY;  Surgeon: Cindie Carlin POUR, DO;  Location: [facility];  Service: Endoscopy;;   BREAST LUMPECTOMY     pt denies this   CHOLECYSTECTOMY N/A 01/18/2021   Procedure: LAPAROSCOPIC CHOLECYSTECTOMY WITH INTRAOPERATIVE CHOLANGIOGRAM;  Surgeon: Curvin Deward MOULD, MD;  Location: [facility] OR;  Service: General;  Laterality: N/A;   COLON RESECTION     COLONOSCOPY  07/17/2009   DOQ:dfjoo internal hemorrhoids/tortuous colon/3-mm sessile hepatic polyp/sigmoid colon diverticula, small internal hemorrhoids, path: tubular adenoma   COLONOSCOPY N/A 07/23/2012   DOQ:Dpwhoz polyp in the ascending colon/Two sessile polyps in the rectum/RECTAL BLEEDING DUE TO Large internal hemorrhoids-S/P BANDINGx3   COLONOSCOPY WITH PROPOFOL  N/A 09/06/2021   non-bleeding internal hemorrhoids, random colonic biopsies. Benign colonic mucosa.   THYROIDECTOMY N/A 12/16/2020   Procedure: TOTAL THYROIDECTOMY;  Surgeon: Llewellyn Gerard LABOR, DO;  Location: [facility] OR;  Service: ENT;  Laterality: N/A;   Family History  Problem Relation Age of Onset    Colon cancer Neg Hx    Hypertension Mother    CVA Mother    Parkinson's disease Mother    Emphysema Father    Social History   Socioeconomic History   Marital status: Legally Separated    Spouse name: Not on file   Number of children: Not on file   Years of education: Not on file   Highest education level: Not on file  Occupational History   Not on file  Tobacco Use   Smoking status: Every Day    Current packs/day: 1.00    Average packs/day: 1 pack/day for 48.0 years (48.0 ttl pk-yrs)    Types: Cigarettes   Smokeless tobacco: Former    Quit date: 05/09/1974   Tobacco comments:    smokes 1 pack per day 07/29/20  Vaping Use   Vaping status: Never Used  Substance and Sexual Activity   Alcohol use: No    Alcohol/week: 0.0 standard drinks of alcohol   Drug use: No   Sexual activity: Not Currently  Other Topics Concern   Not on file  Social History Narrative   Not on file   Social Drivers of Health   Tobacco Use: High Risk (09/08/2023)   Received from [facility]   Patient History    Smoking Tobacco Use: Every Day    Smokeless Tobacco Use: Never    Passive Exposure: Current  Financial Resource Strain: Low Risk (07/05/2022)   Received from  [facility]   Overall Financial Resource Strain (CARDIA)    Difficulty of Paying Living Expenses: Not hard at all  Food Insecurity: No Food Insecurity (09/08/2023)   Received from [facility]   Epic    Within the past 12 months, you worried that your food would run out before you got the money to buy more.: Never true    Within the past 12 months, the food you bought just didn't last and you didn't have money to get more.: Never true  Transportation Needs: No Transportation Needs (09/08/2023)   Received from [facility] - Transportation    Lack of Transportation (Medical): No    Lack of Transportation (Non-Medical): No  Physical Activity: Not on file  Stress: No Stress Concern Present (07/05/2022)    Received from [facility] - Occupational Stress Questionnaire    Feeling of Stress : Not at all  Social Connections: Not on file  Intimate Partner Violence: Not on file  Depression [phone]): Not on file  Alcohol Screen: Not on file  Housing: Low Risk (09/19/2022)   Received from [facility]   Epic    What is your living situation today?: I have a steady place to live    Think about the place you live. Do you have problems with any of the following? Choose all that apply:: Not on file  Utilities: Low Risk (09/08/2023)   Received from [facility]   Utilities    Within the past 12 months, have you been unable to get utilities(heat, electricity) when it was really needed?: No  Health Literacy: Not on file       Objective:  There were no vitals taken for this visit. {Pulm Vitals (Optional):32837}  Physical Exam  Diagnostic Review:  {Labs (Optional):32838}  CT chest 10/27/2022 RADS 4A, RLL. Mild diffuse bronchial wall thickening mild to moderate centrilobular and paraseptal emphysema.    PET scan 11/2022: 1. Interval resolution of the soft tissue thickening along the right lower lobe cystic area. No hypermetabolism to suggest a neoplastic process. Recommend resumption of routine lung cancer screening chest CTs. 2. No enlarged or hypermetabolic mediastinal or hilar lymph nodes. 3. Stable emphysematous changes and age advanced atherosclerotic calcifications.  Assessment & Plan:   Assessment & Plan    No follow-ups on file.    Marny Patch, MD Pulmonary and Critical Care Medicine [facility]    [1]  Current Outpatient Medications:    acetaminophen  (TYLENOL ) 650 MG CR tablet, Take 650-1,300 mg by mouth every 8 (eight) hours as needed for pain., Disp: , Rfl:    albuterol  (PROVENTIL ) (2.5 MG/3ML) 0.083% nebulizer solution, Take 3 mLs (2.5 mg total) by nebulization every 4 (four) hours as needed for  wheezing or shortness of breath., Disp: 75 mL, Rfl: 1   albuterol  (VENTOLIN  HFA) 108 (90 Base) MCG/ACT inhaler, Inhale 2 puffs into the lungs every 4 (four) hours as needed for shortness of breath., Disp: 18 g, Rfl: 2   azelastine (ASTELIN) 0.1 % nasal spray, Place into the nose., Disp: , Rfl:    budesonide -glycopyrrolate-formoterol (BREZTRI  AEROSPHERE) 160-9-4.8 MCG/ACT AERO inhaler, Inhale 1 puff into the lungs daily., Disp: 10.7 g, Rfl: 11   cholecalciferol  (VITAMIN D3) 25 MCG (1000 UNIT) tablet, Take 1,000 Units by mouth daily., Disp: , Rfl:    famotidine  (PEPCID ) 20 MG tablet, One after supper, Disp: 30 tablet, Rfl: 11   fexofenadine (  ALLEGRA) 180 MG tablet, Take 180 mg by mouth daily., Disp: , Rfl:    levothyroxine  (SYNTHROID ) 112 MCG tablet, Take 112 mcg by mouth daily before breakfast., Disp: , Rfl:    pantoprazole  (PROTONIX ) 40 MG tablet, Take 1 tablet (40 mg total) by mouth daily. 30 minutes before breakfast, Disp: 90 tablet, Rfl: 3   rifaximin  (XIFAXAN ) 550 MG TABS tablet, Take 550 mg by mouth 3 (three) times daily., Disp: , Rfl:    tiotropium (SPIRIVA  HANDIHALER) 18 MCG inhalation capsule, Place into inhaler and inhale., Disp: , Rfl:    vitamin B-12 (CYANOCOBALAMIN) 500 MCG tablet, Take 500 mcg by mouth daily., Disp: , Rfl:    vitamin C (ASCORBIC ACID) 500 MG tablet, Take 500 mg by mouth daily., Disp: , Rfl:

## 2024-02-29 ENCOUNTER — Ambulatory Visit

## 2024-02-29 ENCOUNTER — Encounter: Payer: Self-pay | Admitting: Internal Medicine

## 2024-02-29 ENCOUNTER — Ambulatory Visit: Admitting: Internal Medicine

## 2024-02-29 VITALS — BP 132/75 | HR 71 | Ht 64.0 in | Wt 129.0 lb

## 2024-02-29 DIAGNOSIS — J449 Chronic obstructive pulmonary disease, unspecified: Secondary | ICD-10-CM

## 2024-02-29 DIAGNOSIS — F17210 Nicotine dependence, cigarettes, uncomplicated: Secondary | ICD-10-CM | POA: Diagnosis not present

## 2024-02-29 MED ORDER — PREDNISONE 10 MG PO TABS: ORAL_TABLET | ORAL | 0 refills | Status: AC

## 2024-02-29 MED ORDER — BUDESONIDE 0.25 MG/2ML IN SUSP: RESPIRATORY_TRACT | 12 refills | Status: AC

## 2024-02-29 NOTE — Patient Instructions (Addendum)
 Prednisone  10 mg take  4 each am x 2 days,   2 each am x 2 days,  1 each am x 2 days and stop   Change nebulizer albuterol  2.5 mg along with Budesonide  0.25 mg twice  daily   lunch and bedtime   This does not affect Plan B  = albuterol  inhaler and Plan C neublized  albuterol  if you can't catch your breath.   Counseled re importance of smoking cessation but did not meet time criteria for separate billing

## 2024-02-29 NOTE — Progress Notes (Unsigned)
 Paula Carrillo, female    DOB: [birth year]     MRN: [id number]   Brief patient profile:  75 yowf  MZ/active smoker says dx with copd around 2012 and maint on advair and spiriva  and referred to pulmonary clinic in [place]  07/29/2020 by Dr Colette with worsening sob/cough with globus on advair/spiriva  dpi    07/13/20 ent eval: moderate interarytenoid edema and erythema consistent with laryngopharyngeal reflux. 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     History of Present Illness  07/29/2020  Pulmonary/ 1st office eval/ [place] / [facility] Office  Chief Complaint  Patient presents with   Consult    Productive cough with white phlegm, sore throat something growing in it since March 2022  Dyspnea:  [employer] shopping but has to go to slow stops each aisle = MMRC3 = can't walk 100 yards even at a slow pace at a flat grade s stopping due to sob   Cough: sense of throat congestion/ globus  on advair/ spiriva   Sleep: < 30 degrees /bed is flat with pillows  SABA use: very confused re how/ when to use it 02  None  rec The key is to stop smoking completely before smoking completely stops you! Plan A = Automatic = Always=    spiriva  2 puffs each am and stop powdered advair and spiriva   Prednisone  10 mg take  4 each am x 2 days,   2 each am x 2 days,  1 each am x 2 days and stop  Plan B = Backup (to supplement plan A, not to replace it) Only use your albuterol  inhaler as a rescue medicatio Plan C = Crisis (instead of Plan B but only if Plan B stops working) - only use your albuterol  nebulizer if you first try Plan B and it fails to help > ok to use the nebulizer up to every 4 hours but if start needing it regularly call for immediate appointment GERD  diet  Please schedule a follow up office visit in 2 weeks, call sooner if needed with all  medications /inhalers/ solutions in hand so we can verify exactly what you are taking. This includes all medications from all doctors and over the counters      12/16/20  [facility] thyroidectomy   08/31/2021  f/u ov/[place] office/Aishah Teffeteller re: GOLD 2 COPD  maint on breztri  2bid   Chief Complaint  Patient presents with   Follow-up    Cough is improving    Dyspnea:  [employer] shopping / cross parking lot s 02  Cough: better but still smoker worse in am  Sleeping: bed is flat/ raises head sev pillows SABA use: avg twice daily /  02: none  Covid status: none, never infected  Lung cancer screening: referred today  Rec The key is to stop smoking completely before smoking completely stops you! My office will be contacting you by phone for referral for lung cancer screening > not done as of 09/05/2022   Plan A = Automatic = Always=    breztri    Plan B = Backup (to supplement plan A, not to replace it) Only use your albuterol  inhaler as a rescue medication  Plan C = Crisis (instead of Plan B but only if Plan B stops working) - only use your albuterol  nebulizer if you first try Plan  B      09/05/2022  yearly f/u ov/[place] office/Martice Doty re: GOLD 2 COPD/ MZ maint on breztri  x 2 each am  only  Chief Complaint  Patient presents with   Follow-up    Breathing has been worse over the past 6 months. She states it started with swelling in her face. She gets winded walking short distances such as lobby to exam room today. Going out in the heat makes breathing worse. She is coughing- prod with white sputum.  She uses her albuterol  inhaler about once per day and uses the Breztri  once daily.    Dyspnea:  [employer] shopping slower than others  and stops p 127ft =  MMRC3 = can't walk 100 yards even at a slow pace at a flat grade s stopping due to sob   Cough: worse in am and hs > white  Sleeping: head of bed is raised and 3 pillow SABA use: once a day hfa/ no neb  02: none  Plans new ENT eval  July 8th 2024  Rec   Plan A = Automatic = Always=  Breztri  Take 2 puffs first thing in am and then another 2 puffs about 12 hours later.  Work on [employer] B = Backup (to supplement plan A, not to replace it) Only use your albuterol  inhaler as a rescue medication  Plan C = Crisis (instead of Plan B but only if Plan B stops working) - only use your albuterol  nebulizer if you first try Plan B The key is to stop smoking completely before smoking completely stops you!   10/27/22   LDSCT pos nodule  11/24/22  PET done neg    12/06/2022  f/u ov/[place] office/Sausha Raymond re: GOLD 2 MZ maint on breztri    Chief Complaint  Patient presents with   Follow-up    3 month follow up   Dyspnea:  slower others at [facility] but says can do whole store: [facility] = can't walk a nl pace on a flat grade s sob but does fine slow and flat  Cough: better mostly min white but  more vol  in ams- takes about 30 min to clear  Sleeping: bed blocks with 2 pillows  with  noct palpitations and noct spells of gasping sev times a week   better p saba  SABA use: just noct hfa/ no saba daytime hfa or neb needed  02: none  Rec Plan A = Automatic = Always=    Breztri  Take 2 puffs first thing in am and then another 2 puffs about 12 hours later.  Add pepcid  20 mg at bedtime  Plan B = Backup (to supplement plan A, not to replace it) Only use your albuterol  inhaler as a rescue medication   Plan C = Crisis (instead of Plan B but only if Plan B stops working) - only use your albuterol  nebulizer if you first try Plan B and it fails to help > ok to use the nebulizer up to every 4 hours but if start needing it regularly call for immediate appointment The key is to stop smoking completely before smoking completely stops you! Please schedule a follow up visit in  6 months but call sooner if needed     02/29/2024 ACUTE ov/[place] office/Bristol Soy re: GOLD 2 MZ/ still smoking  maint on Breztri    Chief Complaint  Patient presents with   Acute Visit     Congestion - shob - coughing white   Dyspnea:  still doing  [employer] shopping  Cough: worse x 3 weeks turned yellow to white  s abx  Sleeping: bed blocks 2 pillows s   resp cc  SABA use: has neb machine  no meds  02: none  No obvious day to day or daytime variability or assoc excess/ purulent sputum or mucus plugs or hemoptysis or cp or chest tightness, subjective wheeze or overt sinus or hb symptoms.    Also denies any obvious fluctuation of symptoms with weather or environmental changes or other aggravating or alleviating factors except as outlined above   No unusual exposure hx or h/o childhood pna/ asthma or knowledge of premature birth.  Current Allergies, Complete Past Medical History, Past Surgical History, Family History, and Social History were reviewed in [employer] record.  ROS  The following are not active complaints unless bolded Hoarseness, sore throat, dysphagia, dental problems, itching, sneezing,  nasal congestion or discharge of excess mucus or purulent secretions, ear ache,   fever, chills, sweats, unintended wt loss or wt gain, classically pleuritic or exertional cp,  orthopnea pnd or arm/hand swelling  or leg swelling, presyncope, palpitations, abdominal pain, anorexia, nausea, vomiting, diarrhea  or change in bowel habits or change in bladder habits, change in stools or change in urine, dysuria, hematuria,  rash, arthralgias, visual complaints, headache, numbness, weakness or ataxia or problems with walking or coordination,  change in mood or  memory.         Outpatient Medications Prior to Visit  Medication Sig Dispense Refill   acetaminophen  (TYLENOL ) 650 MG CR tablet Take 650-1,300 mg by mouth every 8 (eight) hours as needed for pain.     albuterol  (PROVENTIL ) (2.5 MG/3ML) 0.083% nebulizer solution Take 3 mLs (2.5 mg total) by nebulization every 4 (four) hours as needed for wheezing or shortness of breath. 75 mL 1   albuterol  (VENTOLIN  HFA)  108 (90 Base) MCG/ACT inhaler Inhale 2 puffs into the lungs every 4 (four) hours as needed for shortness of breath. 18 g 2   azelastine (ASTELIN) 0.1 % nasal spray Place into the nose.     budesonide -glycopyrrolate-formoterol (BREZTRI  AEROSPHERE) 160-9-4.8 MCG/ACT AERO inhaler Inhale 1 puff into the lungs daily. 10.7 g 11   cholecalciferol  (VITAMIN D3) 25 MCG (1000 UNIT) tablet Take 1,000 Units by mouth daily.     famotidine  (PEPCID ) 20 MG tablet One after supper 30 tablet 11   fexofenadine (ALLEGRA) 180 MG tablet Take 180 mg by mouth daily.     levothyroxine  (SYNTHROID ) 112 MCG tablet Take 112 mcg by mouth daily before breakfast.     pantoprazole  (PROTONIX ) 40 MG tablet Take 1 tablet (40 mg total) by mouth daily. 30 minutes before breakfast 90 tablet 3   rifaximin  (XIFAXAN ) 550 MG TABS tablet Take 550 mg by mouth 3 (three) times daily.     tiotropium (SPIRIVA  HANDIHALER) 18 MCG inhalation capsule Place into inhaler and inhale.     vitamin B-12 (CYANOCOBALAMIN) 500 MCG tablet Take 500 mcg by mouth daily.     vitamin C (ASCORBIC ACID) 500 MG tablet Take 500 mg by mouth daily.     No facility-administered medications prior to visit.        Past Medical History:  Diagnosis Date   Asthma    Cancer (HCC)    Chronic abdominal pain    Chronic back pain    Chronic neck pain    Chronic pain    Colon cancer (HCC)    COPD (chronic obstructive pulmonary disease) (HCC)  Hemorrhoids    IBS (irritable bowel syndrome)    Migraines        Objective:    Wts  02/29/2024      129  12/06/2022        130  09/05/2022        130  08/31/2021        132  07/19/2021          135 01/12/2021        139   11/12/2020        140   07/29/20 140 lb 12.8 oz (63.9 kg)  03/31/15 165 lb 9.6 oz (75.1 kg)  08/11/14 170 lb (77.1 kg)     Vital signs reviewed  02/29/2024  - Note at rest 02 sats  95% on RA   General appearance:    chronically ill ? Belle indifference affect.     edentulous *** insp /exp rhonchi              Assessment   Assessment & Plan COPD mixed type (HCC) Active smoker/ MZ - 07/29/2020  After extensive coaching inhaler device,  effectiveness =   spiriva  smi 2.5 mg q am and prn saba - 08/11/2020 started daily pred 20 mg until better then 10 mg daily > caused thrush so stopped - PFT's 11/04/20   FEV1 1.31  (52 % ) ratio 0.53  p 20 % improvement from saba p ? prior to study with FV curve poor insp flow but no true plateau, classic exp concavity.  - 11/12/2020    try breztri  2bid/ depomedrol 120 mg IM and approp saba Labs ordered 11/12/2020  :  allergy profile Eos 0.5   and IgE = 67   alpha one AT phenotype  MZ  Level 93    - 08/31/2021   continue breztri  with more approp saba  - 12/06/2022   continue breztri  and add hs pepcid  for noct gasping  - 02/29/2024  After extensive coaching inhaler device,  effectiveness =    50% at best on HFA so supplment breztri  with albuterol / budesonide  0.25 mg bid / Prednisone  10 mg take  4 each am x 2 days,   2 each am x 2 days,  1 each am x 2 days and stop for flar   Cigarette smoker Counseled re importance of smoking cessation but did not meet time criteria for separate billing      AVS  Patient Instructions  Prednisone  10 mg take  4 each am x 2 days,   2 each am x 2 days,  1 each am x 2 days and stop   Change nebulizer albuterol  2.5 mg along with Budesonide  0.25 mg twice  daily   lunch and bedtime   This does not affect Plan B  = albuterol  inhaler and Plan C neublized  albuterol  if you can't catch your breath.   Counseled re importance of smoking cessation but did not meet time criteria for separate billing        Ozell America, MD 02/29/2024

## 2024-02-29 NOTE — Assessment & Plan Note (Addendum)
 Counseled re importance of smoking cessation but did not meet time criteria for separate billing

## 2024-02-29 NOTE — Assessment & Plan Note (Addendum)
 Active smoker/ MZ - 07/29/2020  After extensive coaching inhaler device,  effectiveness =   spiriva  smi 2.5 mg q am and prn saba - 08/11/2020 started daily pred 20 mg until better then 10 mg daily > caused thrush so stopped - PFT's 11/04/20   FEV1 1.31  (52 % ) ratio 0.53  p 20 % improvement from saba p ? prior to study with FV curve poor insp flow but no true plateau, classic exp concavity.  - 11/12/2020    try breztri  2bid/ depomedrol 120 mg IM and approp saba Labs ordered 11/12/2020  :  allergy profile Eos 0.5   and IgE = 67   alpha one AT phenotype  MZ  Level 93    - 08/31/2021   continue breztri  with more approp saba  - 12/06/2022   continue breztri  and add hs pepcid  for noct gasping  - 02/29/2024  After extensive coaching inhaler device,  effectiveness =    50% at best on HFA so supplment breztri  with albuterol / budesonide  0.25 mg bid / Prednisone  10 mg take  4 each am x 2 days,   2 each am x 2 days,  1 each am x 2 days and stop for flar
